# Patient Record
Sex: Female | Born: 1950 | Race: White | Hispanic: No | Marital: Single | Smoking: Former smoker
Health system: Southern US, Community
[De-identification: ages and names within clinical notes are randomized; demographics above are authoritative.]

## PROBLEM LIST (undated history)

## (undated) DIAGNOSIS — M1990 Unspecified osteoarthritis, unspecified site: Secondary | ICD-10-CM

## (undated) DIAGNOSIS — I729 Aneurysm of unspecified site: Secondary | ICD-10-CM

## (undated) DIAGNOSIS — T4145XA Adverse effect of unspecified anesthetic, initial encounter: Secondary | ICD-10-CM

## (undated) HISTORY — PX: ABDOMINAL HYSTERECTOMY: SHX81

---

## 2007-08-23 ENCOUNTER — Encounter: Admission: RE | Admit: 2007-08-23 | Discharge: 2007-08-23 | Payer: Self-pay | Admitting: Obstetrics and Gynecology

## 2010-01-17 ENCOUNTER — Inpatient Hospital Stay (HOSPITAL_COMMUNITY): Admission: EM | Admit: 2010-01-17 | Discharge: 2010-01-18 | Payer: Self-pay | Admitting: Emergency Medicine

## 2010-01-17 ENCOUNTER — Ambulatory Visit: Payer: Self-pay | Admitting: Cardiovascular Disease

## 2010-01-17 ENCOUNTER — Encounter (INDEPENDENT_AMBULATORY_CARE_PROVIDER_SITE_OTHER): Payer: Self-pay | Admitting: Emergency Medicine

## 2010-05-13 LAB — CBC
HCT: 35.7 % — ABNORMAL LOW (ref 36.0–46.0)
MCH: 34.5 pg — ABNORMAL HIGH (ref 26.0–34.0)
MCHC: 35 g/dL (ref 30.0–36.0)
RDW: 12.9 % (ref 11.5–15.5)

## 2010-05-13 LAB — LIPID PANEL
HDL: 71 mg/dL (ref >39)
LDL Cholesterol: 137 mg/dL — ABNORMAL HIGH (ref 0–99)
Total CHOL/HDL Ratio: 3.3 RATIO
Triglycerides: 116 mg/dL (ref <150)
VLDL: 23 mg/dL (ref 0–40)

## 2010-05-13 LAB — HEMOGLOBIN A1C
Hgb A1c MFr Bld: 5.5 % (ref <5.7)
Mean Plasma Glucose: 111 mg/dL (ref <117)

## 2010-05-13 LAB — COMPREHENSIVE METABOLIC PANEL
ALT: 70 U/L — ABNORMAL HIGH (ref 0–35)
Calcium: 9.5 mg/dL (ref 8.4–10.5)
Creatinine, Ser: 0.75 mg/dL (ref 0.4–1.2)
GFR calc Af Amer: >60 mL/min (ref >60)
Glucose, Bld: 104 mg/dL — ABNORMAL HIGH (ref 70–99)
Sodium: 140 mEq/L (ref 135–145)
Total Protein: 7.2 g/dL (ref 6.0–8.3)

## 2010-05-13 LAB — RAPID URINE DRUG SCREEN, HOSP PERFORMED
Barbiturates: NOT DETECTED
Opiates: NOT DETECTED

## 2010-05-13 LAB — URINALYSIS, ROUTINE W REFLEX MICROSCOPIC
Bilirubin Urine: NEGATIVE
Hgb urine dipstick: NEGATIVE
Specific Gravity, Urine: 1.015 (ref 1.005–1.030)
pH: 7.5 (ref 5.0–8.0)

## 2010-05-13 LAB — CARDIAC PANEL(CRET KIN+CKTOT+MB+TROPI)
CK, MB: 1.1 ng/mL (ref 0.3–4.0)
Troponin I: 0.01 ng/mL (ref 0.00–0.06)
Troponin I: 0.01 ng/mL (ref 0.00–0.06)

## 2010-05-13 LAB — PROTIME-INR
INR: 0.89 (ref 0.00–1.49)
Prothrombin Time: 12.3 seconds (ref 11.6–15.2)

## 2010-05-13 LAB — GLUCOSE, CAPILLARY: Glucose-Capillary: 108 mg/dL — ABNORMAL HIGH (ref 70–99)

## 2010-05-13 LAB — ETHANOL: Alcohol, Ethyl (B): <5 mg/dL (ref 0–10)

## 2010-05-13 LAB — URINE MICROSCOPIC-ADD ON

## 2011-01-08 ENCOUNTER — Encounter (HOSPITAL_COMMUNITY): Payer: Self-pay | Admitting: Pharmacy Technician

## 2011-01-13 NOTE — H&P (Signed)
Julie Tapia is an 60 y.o. female.    Chief Complaint: Right hip pain/osteoarthritis  HPI: Patient is a 60 year old white female in no acute distress. Presenting complaint right hip pain for many years. For the last month 9 months it has increased significantly. Patient has tried several conservative treatments, all of which have failed to alleviate her symptoms. X-rays in the clinic show osteoarthritic changes of her right hip. Various options were discussed the patient. Patient wishes to proceed with surgery. Risks, benefits and expectations of the procedure were discussed the patient. Patient understands risks, benefits and expectations and wishes to proceed with a right total hip arthroplasty, anterior approach per Dr. Charlann Boxer that [facility] on 01/20/2011  PCP: No primary care physician indicated. Cardiologist: Dr. Ventura Bruns -has cleared the patient for surgery Cerebrovascular: Dr. Willy Eddy - has cleared the patient for surgery  D/C Plans: Plan and discharged to go home with home health PT  Post-op Meds: Not given  Tranexamic Acid: Patient is not a candidate   PMH: 1. Hypercholesterolemia 2. Aneurysm 3. Degenerative disc disease 4. Arthritis  PSH: No past surgical history on file.  Social History:  Patient admits to previously smoking 2 packs a day for 3-5 years Patient is to drinking wine with dinner daily   Allergies:  Allergies  Allergen Reactions  . Erythromycin Anaphylaxis    All mycins  . Ciprofloxacin Hcl Itching and Swelling  . Contrast Media (Iodinated Diagnostic Agents) Itching and Swelling  . Lidocaine Itching and Other (See Comments)    Redness   . Penicillins Other (See Comments)    Felt like had Chickenpox  . Sulfa Drugs Cross Reactors Itching, Swelling and Other (See Comments)    All sulfa drugs    Medications: 1. Aspirin 325 mg 1 by mouth daily 2. Cephalexin 500 mg 1 by mouth twice a day 3. Vitamin D3 1000 units one by mouth  daily 4. Magnesium 450 mg 1 by mouth daily 5. Paroxetine 10 mg half a tablet by mouth daily 6. Multivitamin 1 by mouth daily 7. Benadryl as needed   ROS: Review of Systems  Constitutional: Negative.   HENT: Negative.   Eyes: Negative.   Respiratory: Negative.   Cardiovascular: Negative.   Gastrointestinal: Negative.   Musculoskeletal: Positive for back pain and joint pain.  Skin: Negative.   Neurological: Negative.   Endo/Heme/Allergies: Negative.   Psychiatric/Behavioral: Negative.    Physican Exam: Physical Exam  Constitutional: She is oriented to person, place, and time and well-developed, well-nourished, and in no distress.  HENT:  Head: Normocephalic and atraumatic.  Right Ear: External ear normal.  Left Ear: External ear normal.  Nose: Nose normal.  Eyes: Conjunctivae and EOM are normal. Pupils are equal, round, and reactive to light.  Neck: Normal range of motion. Neck supple. No JVD present. No tracheal deviation present. No thyromegaly present.  Cardiovascular: Normal rate, regular rhythm and normal heart sounds.  Exam reveals no gallop and no friction rub.   No murmur heard. Pulmonary/Chest: Effort normal and breath sounds normal. No respiratory distress. She has no wheezes. She exhibits no tenderness.  Abdominal: Soft.  Musculoskeletal: She exhibits tenderness.       Right hip: She exhibits decreased range of motion, decreased strength and tenderness. She exhibits no swelling, no deformity and no laceration.  Lymphadenopathy:    She has no cervical adenopathy.  Neurological: She is alert and oriented to person, place, and time.  Skin: Skin is warm and dry.  No rash noted. No erythema. No pallor.  Psychiatric: Affect normal.   Assessment: Right hip pain/OA.  Plan: Patient will undergo a right total hip arthroplasty anterior approach on 01/20/2011. Risks benefits and expectation were discussed with the patient. Patient understand risks, benefits and expectation  and wishes to proceed.  Anastasio Auerbach Julie Tapia   PAC  01/13/2011, 12:14 PM

## 2011-01-16 ENCOUNTER — Other Ambulatory Visit: Payer: Self-pay | Admitting: Orthopedic Surgery

## 2011-01-16 ENCOUNTER — Encounter (HOSPITAL_COMMUNITY): Payer: Self-pay

## 2011-01-16 ENCOUNTER — Encounter (HOSPITAL_COMMUNITY)
Admission: RE | Admit: 2011-01-16 | Discharge: 2011-01-16 | Disposition: A | Discharge status: Home / Self Care | Payer: BC Managed Care – PPO | Source: Ambulatory Visit | Attending: Orthopedic Surgery | Admitting: Orthopedic Surgery

## 2011-01-16 DIAGNOSIS — I729 Aneurysm of unspecified site: Secondary | ICD-10-CM

## 2011-01-16 DIAGNOSIS — M1990 Unspecified osteoarthritis, unspecified site: Secondary | ICD-10-CM

## 2011-01-16 DIAGNOSIS — T8859XA Other complications of anesthesia, initial encounter: Secondary | ICD-10-CM

## 2011-01-16 HISTORY — PX: REDUCTION MAMMAPLASTY: SUR839

## 2011-01-16 HISTORY — DX: Aneurysm of unspecified site: I72.9

## 2011-01-16 HISTORY — DX: Other complications of anesthesia, initial encounter: T88.59XA

## 2011-01-16 HISTORY — PX: JOINT REPLACEMENT: SHX530

## 2011-01-16 HISTORY — DX: Unspecified osteoarthritis, unspecified site: M19.90

## 2011-01-16 HISTORY — PX: BREAST CYST EXCISION: SHX579

## 2011-01-16 HISTORY — DX: Adverse effect of unspecified anesthetic, initial encounter: T41.45XA

## 2011-01-16 LAB — BASIC METABOLIC PANEL
CO2: 26 mEq/L (ref 19–32)
Calcium: 9.5 mg/dL (ref 8.4–10.5)
Chloride: 101 mEq/L (ref 96–112)
Creatinine, Ser: 0.65 mg/dL (ref 0.50–1.10)
Glucose, Bld: 92 mg/dL (ref 70–99)

## 2011-01-16 LAB — DIFFERENTIAL
Basophils Absolute: 0 10*3/uL (ref 0.0–0.1)
Eosinophils Absolute: 0.4 10*3/uL (ref 0.0–0.7)
Eosinophils Relative: 7 % — ABNORMAL HIGH (ref 0–5)
Lymphocytes Relative: 31 % (ref 12–46)
Lymphs Abs: 1.7 10*3/uL (ref 0.7–4.0)
Monocytes Absolute: 0.4 10*3/uL (ref 0.1–1.0)

## 2011-01-16 LAB — SURGICAL PCR SCREEN
MRSA, PCR: NEGATIVE
Staphylococcus aureus: NEGATIVE

## 2011-01-16 LAB — CBC
HCT: 36.9 % (ref 36.0–46.0)
MCH: 32.6 pg (ref 26.0–34.0)
MCV: 97.9 fL (ref 78.0–100.0)
Platelets: 349 10*3/uL (ref 150–400)
RDW: 12.6 % (ref 11.5–15.5)
WBC: 5.5 10*3/uL (ref 4.0–10.5)

## 2011-01-16 LAB — URINALYSIS, ROUTINE W REFLEX MICROSCOPIC
Bilirubin Urine: NEGATIVE
Glucose, UA: NEGATIVE mg/dL
Specific Gravity, Urine: 1.003 — ABNORMAL LOW (ref 1.005–1.030)

## 2011-01-16 LAB — PROTIME-INR: Prothrombin Time: 12.7 seconds (ref 11.6–15.2)

## 2011-01-16 NOTE — Pre-Procedure Instructions (Signed)
01-16-11 ZOX(0'96), Echo 12'11, Medical clearance note-Dr. Britz([facility]) with chart  And Dr. Gwenlyn Fudge 4'12([facility]) with chart.

## 2011-01-16 NOTE — Patient Instructions (Addendum)
20 Julie Tapia  01/16/2011   Your procedure is scheduled on: 01-20-11  Report to [facility] at  07:30 AM.  Call this number if you have problems the morning of surgery: [phone]   Remember:   Do not eat food:After Midnight.  Do not drink clear liquids: After Midnight.  Take these medicines the morning of surgery with A SIP OF WATER: continue Aspirin as instructed by Dr. Nilsa Nutting office. Use Benadryl , Epi Pen if needed.  Do not wear jewelry, make-up or nail polish.  Do not wear lotions, powders, or perfumes. You may wear deodorant.  Do not shave 48 hours prior to surgery.  Do not bring valuables to the hospital.  Contacts, dentures or bridgework may not be worn into surgery.  Leave suitcase in the car. After surgery it may be brought to your room.  For patients admitted to the hospital, checkout time is 11:00 AM the day of discharge.   Patients discharged the day of surgery will not be allowed to drive home.  Name and phone number of your driver: [name] [phone]  Special Instructions: CHG Shower Use Special Wash: 1/2 bottle night before surgery and 1/2 bottle morning of surgery.   Please read over the following fact sheets that you were given: Blood Transfusion Information and MRSA Information, Incentive Spirometry instruction.

## 2011-01-20 ENCOUNTER — Encounter (HOSPITAL_COMMUNITY)
Admission: RE | Disposition: A | Discharge status: Home Health | Payer: Self-pay | Source: Ambulatory Visit | Attending: Orthopedic Surgery

## 2011-01-20 ENCOUNTER — Inpatient Hospital Stay (HOSPITAL_COMMUNITY)
Admission: RE | Admit: 2011-01-20 | Discharge: 2011-01-22 | DRG: 818 | Disposition: A | Discharge status: Home Health | Payer: BC Managed Care – PPO | Source: Ambulatory Visit | Attending: Orthopedic Surgery | Admitting: Orthopedic Surgery

## 2011-01-20 ENCOUNTER — Inpatient Hospital Stay (HOSPITAL_COMMUNITY): Payer: BC Managed Care – PPO

## 2011-01-20 ENCOUNTER — Encounter (HOSPITAL_COMMUNITY): Payer: Self-pay | Admitting: Anesthesiology

## 2011-01-20 ENCOUNTER — Encounter (HOSPITAL_COMMUNITY): Payer: Self-pay | Admitting: *Deleted

## 2011-01-20 ENCOUNTER — Inpatient Hospital Stay (HOSPITAL_COMMUNITY): Payer: BC Managed Care – PPO | Admitting: Anesthesiology

## 2011-01-20 DIAGNOSIS — Z79899 Other long term (current) drug therapy: Secondary | ICD-10-CM

## 2011-01-20 DIAGNOSIS — I729 Aneurysm of unspecified site: Secondary | ICD-10-CM | POA: Diagnosis present

## 2011-01-20 DIAGNOSIS — M1610 Unilateral primary osteoarthritis, unspecified hip: Principal | ICD-10-CM | POA: Diagnosis present

## 2011-01-20 DIAGNOSIS — IMO0002 Reserved for concepts with insufficient information to code with codable children: Secondary | ICD-10-CM | POA: Diagnosis present

## 2011-01-20 DIAGNOSIS — M169 Osteoarthritis of hip, unspecified: Principal | ICD-10-CM | POA: Diagnosis present

## 2011-01-20 DIAGNOSIS — Z01812 Encounter for preprocedural laboratory examination: Secondary | ICD-10-CM

## 2011-01-20 DIAGNOSIS — Z7982 Long term (current) use of aspirin: Secondary | ICD-10-CM

## 2011-01-20 DIAGNOSIS — E7800 Pure hypercholesterolemia, unspecified: Secondary | ICD-10-CM | POA: Diagnosis present

## 2011-01-20 DIAGNOSIS — Z96649 Presence of unspecified artificial hip joint: Secondary | ICD-10-CM

## 2011-01-20 HISTORY — PX: TOTAL HIP ARTHROPLASTY: SHX124

## 2011-01-20 LAB — ABO/RH: ABO/RH(D): A POS

## 2011-01-20 LAB — TYPE AND SCREEN

## 2011-01-20 SURGERY — ARTHROPLASTY, HIP, TOTAL, ANTERIOR APPROACH
Anesthesia: Spinal | Site: Hip | Laterality: Right | Wound class: Clean

## 2011-01-20 MED ORDER — HYDROMORPHONE HCL PF 1 MG/ML IJ SOLN
INTRAMUSCULAR | Status: AC
  Filled 2011-01-20: qty 1

## 2011-01-20 MED ORDER — MIDAZOLAM HCL 5 MG/5ML IJ SOLN
INTRAMUSCULAR | Status: DC | PRN
  Administered 2011-01-20 (×4): 1 mg via INTRAVENOUS

## 2011-01-20 MED ORDER — HYDROMORPHONE HCL PF 1 MG/ML IJ SOLN: 0.5000 mg | INTRAMUSCULAR | Status: DC | PRN

## 2011-01-20 MED ORDER — LACTATED RINGERS IV SOLN
INTRAVENOUS | Status: DC | PRN
  Administered 2011-01-20 (×2): via INTRAVENOUS

## 2011-01-20 MED ORDER — PROMETHAZINE HCL 25 MG/ML IJ SOLN: 6.2500 mg | INTRAMUSCULAR | Status: DC | PRN

## 2011-01-20 MED ORDER — NAPHAZOLINE HCL 0.1 % OP SOLN
1.0000 [drp] | Freq: Four times a day (QID) | OPHTHALMIC | Status: DC | PRN
  Filled 2011-01-20: qty 15

## 2011-01-20 MED ORDER — BUPIVACAINE HCL (PF) 0.5 % IJ SOLN
INTRAMUSCULAR | Status: DC | PRN
  Administered 2011-01-20: 3 mL

## 2011-01-20 MED ORDER — ACETAMINOPHEN 650 MG RE SUPP: 650.0000 mg | Freq: Four times a day (QID) | RECTAL | Status: DC | PRN

## 2011-01-20 MED ORDER — MENTHOL 3 MG MT LOZG: 1.0000 | LOZENGE | OROMUCOSAL | Status: DC | PRN

## 2011-01-20 MED ORDER — CEFAZOLIN SODIUM 1-5 GM-% IV SOLN: 1.0000 g | Freq: Once | INTRAVENOUS | Status: DC

## 2011-01-20 MED ORDER — FERROUS SULFATE 325 (65 FE) MG PO TABS
325.0000 mg | ORAL_TABLET | Freq: Three times a day (TID) | ORAL | Status: DC
  Administered 2011-01-20 – 2011-01-22 (×4): 325 mg via ORAL
  Filled 2011-01-20 (×7): qty 1

## 2011-01-20 MED ORDER — PROPOFOL 10 MG/ML IV EMUL
INTRAVENOUS | Status: DC | PRN
  Administered 2011-01-20: 4.5 mg/kg/h via INTRAVENOUS

## 2011-01-20 MED ORDER — MAGNESIUM HYDROXIDE 400 MG/5ML PO SUSP: 30.0000 mL | Freq: Two times a day (BID) | ORAL | Status: DC | PRN

## 2011-01-20 MED ORDER — PHENOL 1.4 % MT LIQD: 1.0000 | OROMUCOSAL | Status: DC | PRN

## 2011-01-20 MED ORDER — POLYETHYLENE GLYCOL 3350 17 G PO PACK: 17.0000 g | PACK | Freq: Every day | ORAL | Status: DC | PRN

## 2011-01-20 MED ORDER — ONDANSETRON HCL 4 MG PO TABS: 4.0000 mg | ORAL_TABLET | Freq: Four times a day (QID) | ORAL | Status: DC | PRN

## 2011-01-20 MED ORDER — METHOCARBAMOL 500 MG PO TABS
500.0000 mg | ORAL_TABLET | Freq: Four times a day (QID) | ORAL | Status: DC | PRN
  Administered 2011-01-21: 500 mg via ORAL
  Filled 2011-01-20: qty 1

## 2011-01-20 MED ORDER — HYDROMORPHONE HCL PF 2 MG/ML IJ SOLN
INTRAMUSCULAR | Status: AC
  Administered 2011-01-20: 0.5 mg via INTRAMUSCULAR
  Filled 2011-01-20: qty 1

## 2011-01-20 MED ORDER — BISACODYL 10 MG RE SUPP: 10.0000 mg | Freq: Every day | RECTAL | Status: DC | PRN

## 2011-01-20 MED ORDER — SODIUM CHLORIDE 0.9 % IR SOLN
Status: DC | PRN
  Administered 2011-01-20: 1000 mL

## 2011-01-20 MED ORDER — TRANEXAMIC ACID 100 MG/ML IV SOLN
940.0000 mg | INTRAVENOUS | Status: DC
  Filled 2011-01-20: qty 9.4

## 2011-01-20 MED ORDER — RIVAROXABAN 10 MG PO TABS
10.0000 mg | ORAL_TABLET | ORAL | Status: DC
  Administered 2011-01-21 – 2011-01-22 (×2): 10 mg via ORAL
  Filled 2011-01-20 (×2): qty 1

## 2011-01-20 MED ORDER — MAGNESIUM 400 MG PO CAPS: 1.0000 | ORAL_CAPSULE | Freq: Every day | ORAL | Status: DC

## 2011-01-20 MED ORDER — VANCOMYCIN HCL IN DEXTROSE 1-5 GM/200ML-% IV SOLN
1000.0000 mg | Freq: Two times a day (BID) | INTRAVENOUS | Status: AC
  Administered 2011-01-20: 1000 mg via INTRAVENOUS
  Filled 2011-01-20: qty 200

## 2011-01-20 MED ORDER — FENTANYL CITRATE 0.05 MG/ML IJ SOLN
INTRAMUSCULAR | Status: DC | PRN
  Administered 2011-01-20 (×2): 25 ug via INTRAVENOUS

## 2011-01-20 MED ORDER — TRANEXAMIC ACID 100 MG/ML IV SOLN
940.0000 mg | INTRAVENOUS | Status: DC | PRN
  Administered 2011-01-20: 940 mg via INTRAVENOUS

## 2011-01-20 MED ORDER — METOCLOPRAMIDE HCL 5 MG/ML IJ SOLN: 5.0000 mg | Freq: Three times a day (TID) | INTRAMUSCULAR | Status: DC | PRN

## 2011-01-20 MED ORDER — VANCOMYCIN HCL 1000 MG IV SOLR
1000.0000 mg | INTRAVENOUS | Status: DC | PRN
  Administered 2011-01-20: 1000 mg via INTRAVENOUS

## 2011-01-20 MED ORDER — ONDANSETRON HCL 4 MG/2ML IJ SOLN
INTRAMUSCULAR | Status: DC | PRN
  Administered 2011-01-20 (×2): 2 mg via INTRAVENOUS

## 2011-01-20 MED ORDER — THERA M PLUS PO TABS
1.0000 | ORAL_TABLET | Freq: Every day | ORAL | Status: DC
  Administered 2011-01-20 – 2011-01-22 (×3): via ORAL
  Filled 2011-01-20 (×3): qty 1

## 2011-01-20 MED ORDER — HYDROMORPHONE HCL PF 1 MG/ML IJ SOLN
0.2500 mg | INTRAMUSCULAR | Status: DC | PRN
  Administered 2011-01-20: 0.5 mg via INTRAVENOUS

## 2011-01-20 MED ORDER — EPHEDRINE SULFATE 50 MG/ML IJ SOLN
INTRAMUSCULAR | Status: DC | PRN
  Administered 2011-01-20 (×7): 5 mg via INTRAVENOUS

## 2011-01-20 MED ORDER — EPINEPHRINE 0.15 MG/0.3ML IJ DEVI: 0.1500 mg | INTRAMUSCULAR | Status: DC | PRN

## 2011-01-20 MED ORDER — METHOCARBAMOL 100 MG/ML IJ SOLN
500.0000 mg | Freq: Four times a day (QID) | INTRAVENOUS | Status: DC | PRN
  Administered 2011-01-21: 500 mg via INTRAVENOUS
  Filled 2011-01-20: qty 5

## 2011-01-20 MED ORDER — VANCOMYCIN HCL IN DEXTROSE 1-5 GM/200ML-% IV SOLN
INTRAVENOUS | Status: AC
  Filled 2011-01-20: qty 200

## 2011-01-20 MED ORDER — PAROXETINE HCL 10 MG PO TABS
5.0000 mg | ORAL_TABLET | Freq: Every day | ORAL | Status: DC
  Administered 2011-01-21: 5 mg via ORAL
  Administered 2011-01-22: 10 mg via ORAL
  Filled 2011-01-20 (×2): qty 0.5

## 2011-01-20 MED ORDER — FLEET ENEMA 7-19 GM/118ML RE ENEM: 1.0000 | ENEMA | Freq: Every day | RECTAL | Status: DC | PRN

## 2011-01-20 MED ORDER — DOCUSATE SODIUM 100 MG PO CAPS
100.0000 mg | ORAL_CAPSULE | Freq: Two times a day (BID) | ORAL | Status: DC
  Administered 2011-01-20 – 2011-01-22 (×4): 100 mg via ORAL
  Filled 2011-01-20 (×6): qty 1

## 2011-01-20 MED ORDER — ALUM & MAG HYDROXIDE-SIMETH 200-200-20 MG/5ML PO SUSP: 30.0000 mL | ORAL | Status: DC | PRN

## 2011-01-20 MED ORDER — DIPHENHYDRAMINE HCL 25 MG PO CAPS
25.0000 mg | ORAL_CAPSULE | Freq: Four times a day (QID) | ORAL | Status: DC | PRN
Start: 2011-01-20 — End: 2011-01-22
  Administered 2011-01-20 – 2011-01-21 (×3): 25 mg via ORAL
  Filled 2011-01-20 (×4): qty 1

## 2011-01-20 MED ORDER — BISACODYL 5 MG PO TBEC: 10.0000 mg | DELAYED_RELEASE_TABLET | Freq: Every day | ORAL | Status: DC | PRN

## 2011-01-20 MED ORDER — ACETAMINOPHEN 10 MG/ML IV SOLN
INTRAVENOUS | Status: AC
  Filled 2011-01-20: qty 100

## 2011-01-20 MED ORDER — ACETAMINOPHEN 325 MG PO TABS: 650.0000 mg | ORAL_TABLET | Freq: Four times a day (QID) | ORAL | Status: DC | PRN

## 2011-01-20 MED ORDER — ONDANSETRON HCL 4 MG/2ML IJ SOLN: 4.0000 mg | Freq: Four times a day (QID) | INTRAMUSCULAR | Status: DC | PRN

## 2011-01-20 MED ORDER — HYDROCODONE-ACETAMINOPHEN 7.5-325 MG PO TABS
1.0000 | ORAL_TABLET | ORAL | Status: DC | PRN
  Administered 2011-01-21: 2 via ORAL
  Filled 2011-01-20: qty 2

## 2011-01-20 MED ORDER — HYDROMORPHONE HCL PF 2 MG/ML IJ SOLN
INTRAMUSCULAR | Status: AC
  Administered 2011-01-20: 1 mg via INTRAVENOUS
  Filled 2011-01-20: qty 1

## 2011-01-20 MED ORDER — SODIUM CHLORIDE 0.9 % IV SOLN
INTRAVENOUS | Status: DC
  Administered 2011-01-20 – 2011-01-22 (×3): via INTRAVENOUS
  Filled 2011-01-20 (×14): qty 1000

## 2011-01-20 MED ORDER — VITAMIN D3 25 MCG (1000 UNIT) PO TABS
1000.0000 [IU] | ORAL_TABLET | Freq: Every day | ORAL | Status: DC
  Administered 2011-01-20 – 2011-01-22 (×3): 1000 [IU] via ORAL
  Filled 2011-01-20 (×3): qty 1

## 2011-01-20 MED ORDER — LACTATED RINGERS IV SOLN: INTRAVENOUS | Status: DC

## 2011-01-20 MED ORDER — OMEGA-3-ACID ETHYL ESTERS 1 G PO CAPS
2.0000 g | ORAL_CAPSULE | Freq: Every day | ORAL | Status: DC
  Administered 2011-01-20: 2 g via ORAL
  Administered 2011-01-21: 1 g via ORAL
  Administered 2011-01-22: 2 g via ORAL
  Filled 2011-01-20 (×3): qty 2

## 2011-01-20 MED ORDER — METOCLOPRAMIDE HCL 10 MG PO TABS: 5.0000 mg | ORAL_TABLET | Freq: Three times a day (TID) | ORAL | Status: DC | PRN

## 2011-01-20 MED ORDER — HYDROMORPHONE HCL PF 2 MG/ML IJ SOLN
0.5000 mg | INTRAMUSCULAR | Status: DC | PRN
  Administered 2011-01-20 – 2011-01-21 (×2): 1 mg via INTRAVENOUS
  Filled 2011-01-20 (×2): qty 1

## 2011-01-20 MED ORDER — MAGNESIUM OXIDE 400 MG PO TABS
400.0000 mg | ORAL_TABLET | Freq: Every day | ORAL | Status: DC
  Administered 2011-01-20 – 2011-01-22 (×3): 400 mg via ORAL
  Filled 2011-01-20 (×3): qty 1

## 2011-01-20 MED ORDER — BUPIVACAINE HCL (PF) 0.5 % IJ SOLN
INTRAMUSCULAR | Status: AC
  Filled 2011-01-20: qty 30

## 2011-01-20 SURGICAL SUPPLY — 37 items
BAG ZIPLOCK 12X15 (MISCELLANEOUS) ×4 IMPLANT
BLADE SAW SGTL 18X1.27X75 (BLADE) ×2 IMPLANT
CELLS DAT CNTRL 66122 CELL SVR (MISCELLANEOUS) ×1 IMPLANT
CLOTH BEACON ORANGE TIMEOUT ST (SAFETY) ×2 IMPLANT
DERMABOND ADVANCED (GAUZE/BANDAGES/DRESSINGS) ×1
DERMABOND ADVANCED .7 DNX12 (GAUZE/BANDAGES/DRESSINGS) ×1 IMPLANT
DRAPE C-ARM 42X72 X-RAY (DRAPES) ×2 IMPLANT
DRAPE STERI IOBAN 125X83 (DRAPES) ×2 IMPLANT
DRAPE U-SHAPE 47X51 STRL (DRAPES) ×6 IMPLANT
DRSG AQUACEL AG ADV 3.5X10 (GAUZE/BANDAGES/DRESSINGS) ×2 IMPLANT
DRSG TEGADERM 4X4.75 (GAUZE/BANDAGES/DRESSINGS) ×2 IMPLANT
DURAPREP 26ML APPLICATOR (WOUND CARE) ×2 IMPLANT
ELECT BLADE TIP CTD 4 INCH (ELECTRODE) ×2 IMPLANT
ELECT REM PT RETURN 9FT ADLT (ELECTROSURGICAL) ×2
ELECTRODE REM PT RTRN 9FT ADLT (ELECTROSURGICAL) ×1 IMPLANT
EVACUATOR 1/8 PVC DRAIN (DRAIN) ×2 IMPLANT
FACESHIELD LNG OPTICON STERILE (SAFETY) ×8 IMPLANT
GAUZE SPONGE 2X2 8PLY STRL LF (GAUZE/BANDAGES/DRESSINGS) ×1 IMPLANT
GLOVE BIOGEL PI IND STRL 7.5 (GLOVE) ×1 IMPLANT
GLOVE BIOGEL PI IND STRL 8.5 (GLOVE) ×1 IMPLANT
GLOVE BIOGEL PI INDICATOR 7.5 (GLOVE) ×1
GLOVE BIOGEL PI INDICATOR 8.5 (GLOVE) ×1
GLOVE ECLIPSE 8.0 STRL XLNG CF (GLOVE) ×2 IMPLANT
GLOVE ORTHO TXT STRL SZ7.5 (GLOVE) ×4 IMPLANT
GOWN STRL NON-REIN LRG LVL3 (GOWN DISPOSABLE) ×2 IMPLANT
KIT BASIN OR (CUSTOM PROCEDURE TRAY) ×2 IMPLANT
PACK TOTAL JOINT (CUSTOM PROCEDURE TRAY) ×2 IMPLANT
PADDING CAST COTTON 6X4 STRL (CAST SUPPLIES) ×2 IMPLANT
RTRCTR WOUND ALEXIS 18CM MED (MISCELLANEOUS) ×2
SPONGE GAUZE 2X2 STER 10/PKG (GAUZE/BANDAGES/DRESSINGS) ×1
SUCTION FRAZIER 12FR DISP (SUCTIONS) ×2 IMPLANT
SUT MNCRL AB 4-0 PS2 18 (SUTURE) ×2 IMPLANT
SUT VIC AB 1 CT1 36 (SUTURE) ×8 IMPLANT
SUT VIC AB 2-0 CT1 27 (SUTURE) ×2
SUT VIC AB 2-0 CT1 TAPERPNT 27 (SUTURE) ×2 IMPLANT
TOWEL OR 17X26 10 PK STRL BLUE (TOWEL DISPOSABLE) ×4 IMPLANT
TRAY FOLEY CATH 14FRSI W/METER (CATHETERS) ×2 IMPLANT

## 2011-01-20 NOTE — Anesthesia Procedure Notes (Signed)
Spinal  Patient location during procedure: OR Staffing Performed by: anesthesiologist  Preanesthetic Checklist Completed: patient identified, site marked, surgical consent, pre-op evaluation, timeout performed, IV checked, risks and benefits discussed and monitors and equipment checked Spinal Block Patient position: sitting Prep: Betadine Patient monitoring: heart rate, continuous pulse ox and blood pressure Location: L2-3 Injection technique: single-shot Needle Needle type: Spinocan  Needle gauge: 22 G Needle length: 9 cm Needle insertion depth: 4.5 cm Assessment Sensory level: T10 Additional Notes Expiration date of kit checked and confirmed. Patient tolerated procedure well, without complications.

## 2011-01-20 NOTE — Progress Notes (Signed)
Did not release Ancef patient wants to speak  with the doctor

## 2011-01-20 NOTE — Anesthesia Preprocedure Evaluation (Addendum)
Anesthesia Evaluation  Patient identified by MRN, date of birth, ID band Patient awake    Reviewed: Allergy & Precautions, H&P , NPO status , Patient's Chart, lab work & pertinent test results  Airway Mallampati: II TM Distance: >3 FB Neck ROM: full    Dental No notable dental hx.    Pulmonary neg pulmonary ROS, asthma ,  clear to auscultation  Pulmonary exam normal       Cardiovascular Exercise Tolerance: Good neg cardio ROS regular Normal PFO   Neuro/Psych Negative Neurological ROS  Negative Psych ROS   GI/Hepatic negative GI ROS, Neg liver ROS,   Endo/Other  Negative Endocrine ROS  Renal/GU negative Renal ROS  Genitourinary negative   Musculoskeletal   Abdominal   Peds  Hematology negative hematology ROS (+)   Anesthesia Other Findings   Reproductive/Obstetrics negative OB ROS                          Anesthesia Physical Anesthesia Plan  ASA: II  Anesthesia Plan: General and Spinal   Post-op Pain Management:    Induction:   Airway Management Planned:   Additional Equipment:   Intra-op Plan:   Post-operative Plan:   Informed Consent: I have reviewed the patients History and Physical, chart, labs and discussed the procedure including the risks, benefits and alternatives for the proposed anesthesia with the patient or authorized representative who has indicated his/her understanding and acceptance.   Dental Advisory Given  Plan Discussed with: CRNA  Anesthesia Plan Comments:        Anesthesia Quick Evaluation

## 2011-01-20 NOTE — Interval H&P Note (Signed)
History and Physical Interval Note:   01/20/2011   8:47 AM   Julie Tapia  has presented today for surgery, with the diagnosis of Osteoarthritis Right Hip  The various methods of treatment have been discussed with the patient and family. After consideration of risks, benefits and other options for treatment, the patient has consented to  Procedure(s): right TOTAL HIP ARTHROPLASTY through an ANTERIOR APPROACH as a surgical intervention .  The patients' history has been reviewed, patient examined, no change in status, stable for surgery.  I have reviewed the patients' chart and labs.  Questions were answered to the patient's satisfaction.     Shelda Pal  MD

## 2011-01-20 NOTE — Anesthesia Postprocedure Evaluation (Signed)
  Anesthesia Post-op Note  Patient: Julie Tapia  Procedure(s) Performed:  TOTAL HIP ARTHROPLASTY ANTERIOR APPROACH  Patient Location: PACU  Anesthesia Type: General  Level of Consciousness: awake and alert   Airway and Oxygen Therapy: Patient Spontanous Breathing  Post-op Pain: mild  Post-op Assessment: Post-op Vital signs reviewed, Patient's Cardiovascular Status Stable, Respiratory Function Stable, Patent Airway and No signs of Nausea or vomiting  Post-op Vital Signs: stable  Complications: No apparent anesthesia complications

## 2011-01-20 NOTE — Op Note (Signed)
NAME:  Julie Tapia                ACCOUNT NO.: [id number]      MEDICAL RECORD NO.: [id number]      FACILITY:  [facility]      PHYSICIAN:  Durene Romans D  DATE OF BIRTH:  11-05-1950     DATE OF PROCEDURE:  01/20/2011                                 OPERATIVE REPORT         PREOPERATIVE DIAGNOSIS: Right  hip osteoarthritis.      POSTOPERATIVE DIAGNOSIS:  Right osteoarthritis.      PROCEDURE:  Right total hip replacement through an anterior approach   utilizing DePuy THR system, component size 52 pinnacle cup, a size 36+4 neutral   Altrex liner, a size 5 standard Tri Lock stem with a 36=1.5 delta ceramic   ball.      SURGEON:  Madlyn Frankel. Charlann Boxer, M.D.      ASSISTANT:  Lanney Gins, PA      ANESTHESIA:  Spinal.      SPECIMENS:  None.      COMPLICATIONS:  None.      BLOOD LOSS:  <400 cc     DRAINS:  One Hemovac.      INDICATION OF THE PROCEDURE:  Julie Tapia is a 60 y.o. female who had   presented to office for evaluation of right hip pain.  Radiographs revealed   progressive degenerative changes with bone-on-bone   articulation to the  hip joint.  The patient had painful limited range of   motion significantly affecting their overall quality of life.  The patient was failing to    respond to conservative measures, and at this point was ready   to proceed with more definitive measures.  The patient has noted progressive   degenerative changes in his hip, progressive problems and dysfunction   with regarding the hip prior to surgery.  Consent was obtained for   benefit of pain relief.  Specific risk of infection, DVT, component   failure, dislocation, need for revision surgery, as well discussion of   the anterior versus posterior approach were reviewed.  Consent was   obtained for benefit of anterior pain relief through an anterior   approach.      PROCEDURE IN DETAIL:  The patient was brought to operative theater.   Once adequate anesthesia, preoperative  antibiotics, Ancef administered.   The patient was positioned supine on the OSI Hanna table.  Once adequate   padding and boney process was carried out, we had predraped out the hip, and  used fluoroscopy to confirm orientation of the pelvis and position.      The right hip was then prepped and draped from proximal iliac crest to   mid thigh with shower curtain technique.      Time-out was performed identifying the patient, planned procedure, and   extremity.  An incision was then made 2 cm distal and lateral to the   anterior superior iliac spine extending over the orientation of the   tensor fascia lata muscle and sharp dissection was carried down to the   fascia of the muscle and protractor placed in the soft tissues.      The fascia was then incised.  The muscle belly was identified and swept   laterally and retractor placed  along the superior neck.  Following   cauterization of the circumflex vessels and removing some pericapsular   fat, a second cobra retractor was placed on the inferior neck.  A third   retractor was placed on the anterior acetabulum after elevating the   anterior rectus.  A L-capsulotomy was along the line of the   superior neck to the trochanteric fossa, then extended proximally and   distally.  Stay sutures were placed and the retractors were then placed   intracapsular.  We then identified the orientation of the trochanteric fossa and   identify the orientation of my neck cut, confirmed this radiographically   and then made a neck osteotomy with the femur on traction.  The femoral   head was removed without difficulty or complication.  Traction was let   off and retractors were placed posterior and anterior for the   acetabulum.      The labrum and foveal tissue were debrided.  I began reaming with a 46mm   reamer and reamed up to 51 reamer with good bony bed preparation and a 52mm   cup was chosen.  The final 52 Pinnacle cup was then impacted under  fluoroscopy  to confirm the depth of penetration and orientation and turned to   abduction.  A screw was placed followed by the hole eliminator was placed and the final   36+4 Altrex liner impacted with good visualized rim fit.  The cup was positioned anatomically within the   acetabular portion of the pelvis.      At this point, the femur was rolled at 80 degrees.  Further capsule was   released off the inferior aspect of the femoral neck.  I then   released the superior capsule proximally.  The hook was placed laterally   along the femur and elevated manually and held in position with the bed   hook.  The leg was then extended and adducted with the leg roll to 100   degrees of external rotation.  Once the proximal femur was fully   exposed, I used a box osteotome to set orientation.  I then began   broaching with the starting chili pepper broach and passed this by hand and then broached up to 5.  With the 5 broach in place I chose a standard neck and did a trial reduction.  The offset was appropriate, leg lengths   appeared to be equal, confirmed radiographically.   Given these findings, I went ahead and dislocated the hip, repositioned all   retractors and positioned the right hip and extended and abducted way.  The final 5 standard  Tri Lock stem was   chosen and it was impacted down to the level of neck cut.  Based on this   and the trial reduction, a 36+1.5 delta ceramic ball was chosen and   impacted onto a clean and dry trunnion, and the hip was reduced.  The   hip had been irrigated throughout the case again at this point.  I did   reapproximate the superior capsular leaflet to the anterior leaflet   using #1 Vicryl, placed a medium Hemovac drain deep.  The fascia of the   tensor fascia lata muscle was then reapproximated using #1 Vicryl.  The   remaining wound was closed with 2-0 Vicryl and running 4-0 Monocryl.   The hip was cleaned, dried, and dressed sterilely using Dermabond  and   Aquacel dressing.  Drain site dressed separately.  She was then brought   to recovery room in stable condition tolerating the procedure well.    Danae Orleans, PA-C was present for the entirety of the case involved from   preoperative positioning, perioperative retractor management, general   facilitation of the case, as well as primary wound closure as assistant.            Pietro Cassis Alvan Dame, M.D.            MDO/MEDQ  D:  12/23/2010  T:  12/23/2010  Job:  [id number]      Electronically Signed by Paralee Cancel M.D. on 12/29/2010 09:15:38 AM

## 2011-01-20 NOTE — Transfer of Care (Signed)
Immediate Anesthesia Transfer of Care Note  Patient: Julie Tapia  Procedure(s) Performed:  TOTAL HIP ARTHROPLASTY ANTERIOR APPROACH  Patient Location: PACU  Anesthesia Type: Spinal  Level of Consciousness: awake and alert   Airway & Oxygen Therapy: Patient Spontanous Breathing and Patient connected to face mask  Post-op Assessment: Report given to PACU RN and Post -op Vital signs reviewed and stable  Post vital signs: Reviewed and stable  Complications: No apparent anesthesia complications

## 2011-01-21 ENCOUNTER — Encounter (HOSPITAL_COMMUNITY): Payer: Self-pay | Admitting: Orthopedic Surgery

## 2011-01-21 LAB — BASIC METABOLIC PANEL
BUN: 7 mg/dL (ref 6–23)
Creatinine, Ser: 0.61 mg/dL (ref 0.50–1.10)
GFR calc Af Amer: >90 mL/min (ref >90)
GFR calc non Af Amer: >90 mL/min (ref >90)
Potassium: 3.9 mEq/L (ref 3.5–5.1)

## 2011-01-21 LAB — CBC
HCT: 28.6 % — ABNORMAL LOW (ref 36.0–46.0)
MCHC: 33.2 g/dL (ref 30.0–36.0)
MCV: 97.3 fL (ref 78.0–100.0)
Platelets: 239 10*3/uL (ref 150–400)
RDW: 12.3 % (ref 11.5–15.5)

## 2011-01-21 MED ORDER — OXYCODONE HCL 5 MG PO TABS
5.0000 mg | ORAL_TABLET | ORAL | Status: DC | PRN
  Administered 2011-01-21 (×2): 5 mg via ORAL
  Filled 2011-01-21 (×4): qty 1

## 2011-01-21 MED ORDER — KETOROLAC TROMETHAMINE 15 MG/ML IJ SOLN
7.5000 mg | Freq: Four times a day (QID) | INTRAMUSCULAR | Status: DC
  Administered 2011-01-21 – 2011-01-22 (×3): 7.5 mg via INTRAVENOUS
  Filled 2011-01-21 (×5): qty 1

## 2011-01-21 MED ORDER — KETOROLAC TROMETHAMINE 15 MG/ML IJ SOLN
15.0000 mg | Freq: Four times a day (QID) | INTRAMUSCULAR | Status: DC
  Administered 2011-01-21: 15 mg via INTRAVENOUS
  Filled 2011-01-21 (×2): qty 1

## 2011-01-21 MED ORDER — ACETAMINOPHEN 500 MG PO TABS
1000.0000 mg | ORAL_TABLET | Freq: Three times a day (TID) | ORAL | Status: DC
  Administered 2011-01-21 – 2011-01-22 (×4): 1000 mg via ORAL
  Filled 2011-01-21: qty 2
  Filled 2011-01-21: qty 1
  Filled 2011-01-21 (×6): qty 2

## 2011-01-21 MED ORDER — ASPIRIN EC 325 MG PO TBEC
325.0000 mg | DELAYED_RELEASE_TABLET | Freq: Every day | ORAL | Status: DC
  Administered 2011-01-21: 325 mg via ORAL
  Filled 2011-01-21 (×3): qty 1

## 2011-01-21 MED ORDER — SODIUM CHLORIDE 0.9 % IV BOLUS (SEPSIS)
500.0000 mL | Freq: Once | INTRAVENOUS | Status: AC
  Administered 2011-01-21: 500 mL via INTRAVENOUS

## 2011-01-21 NOTE — Progress Notes (Signed)
Physical Therapy Treatment Patient Details Name: Julie Tapia MRN: [id number] DOB: 06-07-1950 Today's Date: 01/21/2011 1445 - 1508  2GT PT Assessment/Plan  PT - Assessment/Plan PT Plan: Discharge plan remains appropriate PT Frequency: 7X/week Follow Up Recommendations: Home health PT PT Goals  Acute Rehab PT Goals PT Goal: Supine/Side to Sit - Progress: Progressing toward goal Pt will Transfer Sit to Stand/Stand to Sit: with supervision PT Goal: Ambulate - Progress: Progressing toward goal  PT Treatment Precautions/Restrictions  Restrictions Weight Bearing Restrictions: No Mobility (including Balance) Bed Mobility Supine to Sit: 5: Supervision;4: Min assist Transfers Sit to Stand: 5: Supervision;4: Min assist;From bed;With upper extremity assist Sit to Stand Details (indicate cue type and reason): cues for LE position and use of UEs Stand to Sit: 4: Min assist;To bed;With upper extremity assist Stand to Sit Details: cues for LE position and use of UEs Ambulation/Gait Ambulation/Gait Assistance: 5: Supervision;4: Min assist Ambulation/Gait Assistance Details (indicate cue type and reason): cues for posture and position from RW Ambulation Distance (Feet):  (2x180) Assistive device: Rolling walker Gait Pattern: Step-to pattern;Step-through pattern    Exercise    End of Session PT - End of Session Activity Tolerance: Patient tolerated treatment well Patient left: Other (comment) (sitting at bedside for OT) General Behavior During Session: [facility] for tasks performed Cognition: [facility] for tasks performed  Zia Kanner 01/21/2011, 3:54 PM

## 2011-01-21 NOTE — Progress Notes (Signed)
Physical Therapy Evaluation Patient Details Name: Julie Tapia MRN: [id number] DOB: 02-Apr-1950 Today's Date: 01/21/2011 1002 - 1035 EVAL Problem List: There is no problem list on file for this patient.   Past Medical History:  Past Medical History  Diagnosis Date  . Complication of anesthesia 01-16-11    Pt. very slow to awaken and was days before feeling like herself.  . 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 ICA  aneurysm  . Asthma 01-16-11    chronic asthmatic-child yrs, no severe issues in adult yrs", occ. tight chested-"no inhalers in yrs.  . Arthritis 01-16-11    osteoarthritis,-knees, hips, ankles. 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 Abdominal hysterectomy   . Reduction mammaplasty 01-16-11    Bil. '86( problems with s/p surgical clot at rt. breast drainage site)  . Joint replacement 01-16-11    Rt. great toe ball joint replacement-metal  . Breast cyst excision 01-16-11    several excisions in the past    PT Assessment/Plan/Recommendation PT Assessment Clinical Impression Statement: Pt with R THR 01/20/11 presents with decreased functional mobility and will benefit from skilled PT intervention to maximize I for D/C home PT Recommendation/Assessment: Patient will need skilled PT in the acute care venue PT Problem List: Decreased strength;Decreased activity tolerance;Decreased mobility;Decreased knowledge of use of DME;Pain PT Therapy Diagnosis : Difficulty walking PT Plan PT Frequency: 7X/week PT Treatment/Interventions: DME instruction;Gait training;Stair training;Therapeutic exercise;Patient/family education PT Recommendation Recommendations for Other Services: OT consult Follow Up Recommendations: Home health PT PT Goals  Acute Rehab PT Goals PT Goal Formulation: With patient Time For Goal Achievement: 6  days Pt will go Supine/Side to Sit: with supervision PT Goal: Supine/Side to Sit - Progress: Not met Pt will go Sit to Supine/Side: with supervision PT Goal: Sit to Supine/Side - Progress: Not met Pt will Transfer Sit to Stand/Stand to Sit: with supervision PT Transfer Goal: Sit to Stand/Stand to Sit - Progress: Not met Pt will Ambulate: >150 feet;with supervision;with rolling walker PT Goal: Ambulate - Progress: Not met Pt will Go Up / Down Stairs: Flight;with min assist;with least restrictive assistive device PT Goal: Up/Down Stairs - Progress: Not met Additional Goals Additional Goal #1: UP/down 1+ 1 steps with RW and min assist PT Goal: Additional Goal #1 - Progress: Not met  PT Evaluation Precautions/Restrictions  Restrictions Weight Bearing Restrictions: No Prior Functioning  Home Living Lives With: Family Receives Help From: Family Type of Home: House Home Layout: Two level Alternate Level Stairs-Rails: Left Alternate Level Stairs-Number of Steps: 15 Home Access:  (1+1 steps to enter) Prior Function Level of Independence: Independent with basic ADLs;Independent with homemaking with ambulation;Independent with gait;Independent with transfers Cognition Cognition Arousal/Alertness: Awake/alert Overall Cognitive Status: Appears within functional limits for tasks assessed Orientation Level: Oriented X4 Sensation/Coordination Coordination Gross Motor Movements are Fluid and Coordinated: Yes Extremity Assessment RUE Assessment RUE Assessment: Within Functional Limits LUE Assessment LUE Assessment: Within Functional Limits RLE Assessment RLE Assessment: Within Functional Limits LLE Assessment LLE Assessment: Within Functional Limits Mobility (including Balance) Bed Mobility Bed Mobility: Yes Supine to Sit: 4: Min assist;3: Mod assist Transfers Transfers: Yes Sit to Stand: 4: Min assist;3: Mod assist;With upper extremity assist;From bed Sit to Stand Details  (indicate cue type and reason): cues for LE position and use of UEs Stand to Sit: 4: Min assist;With armrests;With  upper extremity assist;To chair/3-in-1 Stand to Sit Details: cues for LE position and use of UEs Ambulation/Gait Ambulation/Gait: Yes Ambulation/Gait Assistance: 4: Min assist Ambulation/Gait Assistance Details (indicate cue type and reason): cues for sequence, posture and position from RW Ambulation Distance (Feet): 140 Feet Assistive device: Rolling walker Gait Pattern: Step-to pattern;Step-through pattern    Exercise  Total Joint Exercises Ankle Circles/Pumps: AROM;15 reps;Supine;Both Heel Slides: AAROM;Supine;15 reps;Right Hip ABduction/ADduction: AAROM;Supine;15 reps;Right End of Session PT - End of Session Equipment Utilized During Treatment: Gait belt Activity Tolerance: Patient tolerated treatment well Patient left: in chair;with call bell in reach;with family/visitor present General Behavior During Session: [facility] for tasks performed Cognition: [facility] for tasks performed  Mahalia Dykes 01/21/2011, 1:21 PM

## 2011-01-21 NOTE — Progress Notes (Signed)
Subjective: 1 Day Post-Op Procedure(s) (LRB): TOTAL HIP ARTHROPLASTY ANTERIOR APPROACH (Right)   Patient reports pain as severe. No events.  Objective:   VITALS:   Filed Vitals:   01/21/11 0450  BP: 117/66  Pulse: 93  Temp: 101 F (38.3 C)  Resp: 18    Neurovascular intact Dorsiflexion/Plantar flexion intact Incision: dressing C/D/I No cellulitis present Compartment soft  LABS  Basename 01/21/11 0350  HGB 9.5*  HCT 28.6*  WBC 7.2  PLT 239     Basename 01/21/11 0350  NA 130*  K 3.9  BUN 7  CREATININE 0.61  GLUCOSE 124*    Assessment/Plan: 1 Day Post-Op Procedure(s) (LRB): TOTAL HIP ARTHROPLASTY ANTERIOR APPROACH (Right)  Pt is able to use her personal Biofreeze and prescribed Pt changed to Oxycodone, APAP and Toradol Advance diet Up with therapy D/C home with home health PT on Thurs/Fri   Anastasio Auerbach. Vicktoria Muckey   PAC  01/21/2011, 9:19 AM

## 2011-01-21 NOTE — Progress Notes (Signed)
Occupational Therapy Evaluation Patient Details Name: Julie Tapia MRN: [id number] DOB: 1950/12/31 Today's Date: 01/21/2011 15:09-15:42 evII Problem List: There is no problem list on file for this patient.   Past Medical History:  Past Medical History  Diagnosis Date  . Complication of anesthesia 01-16-11    Pt. very slow to awaken and was days before feeling like herself.  . 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 ICA  aneurysm  . Asthma 01-16-11    chronic asthmatic-child yrs, no severe issues in adult yrs", occ. tight chested-"no inhalers in yrs.  . Arthritis 01-16-11    osteoarthritis,-knees, hips, ankles. 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 Abdominal hysterectomy   . Reduction mammaplasty 01-16-11    Bil. '86( problems with s/p surgical clot at rt. breast drainage site)  . Joint replacement 01-16-11    Rt. great toe ball joint replacement-metal  . Breast cyst excision 01-16-11    several excisions in the past  . Total hip arthroplasty 01/20/2011    Procedure: TOTAL HIP ARTHROPLASTY ANTERIOR APPROACH;  Surgeon: Shelda Pal;  Location: [facility];  Service: Orthopedics;  Laterality: Right;    OT Assessment/Plan/Recommendation OT Assessment Clinical Impression Statement: Pleasant 60 year  old female admitted for elective right total hip replacement.  Pt presents with minimal pain but likewise is currently supervision for all simulated ADL's using appropriate AE.   Pt will have initial 24hr supervision at d/c from family.  Pt and family have been educated on DME use and AE use as well as availabilty for purchase.  Feel patient does not need any further OT services at this time.  OT Recommendation/Assessment: Patient does not need any further OT services Barriers to Discharge: Inaccessible home environment Barriers to Discharge  Comments: Pt has one flight of steps to access the bedrooms and bathrooms on the second floor. OT Recommendation Follow Up Recommendations: None Equipment Recommended: None recommended by OT OT Goals    OT Evaluation Precautions/Restrictions  Precautions Required Braces or Orthoses: No Restrictions Weight Bearing Restrictions: No Prior Functioning Home Living Lives With: Family Receives Help From: Family Type of Home: House Home Layout: Two level Alternate Level Stairs-Rails: Left Alternate Level Stairs-Number of Steps: 15 Home Access:  (1+1 steps to enter) Bathroom Shower/Tub: Walk-in shower;Door (Pt has grab bars and hand held shower) Bathroom Toilet: Standard Bathroom Accessibility: Yes (Only half bath on the first level.) How Accessible: Accessible via walker Home Adaptive Equipment: Bedside commode/3-in-1;Walker - rolling;Shower chair with back Prior Function Level of Independence: Independent with basic ADLs;Independent with homemaking with ambulation;Independent with gait;Independent with transfers ADL ADL Eating/Feeding: Simulated;Independent Where Assessed - Eating/Feeding: Edge of bed Grooming: Simulated;Supervision/safety Where Assessed - Grooming: Standing at sink Upper Body Bathing: Simulated;Supervision/safety Where Assessed - Upper Body Bathing: Unsupported;Sitting, bed Lower Body Bathing: Simulated;Supervision/safety Lower Body Bathing Details (indicate cue type and reason): Pt will be able to utilize reacher and LH sponge for bathing. Where Assessed - Lower Body Bathing: Sit to stand from bed Upper Body Dressing: Simulated;Set up Where Assessed - Upper Body Dressing: Unsupported;Sitting, bed Lower Body Dressing: Simulated;Supervision/safety Lower Body Dressing Details (indicate cue type and reason): Pt utilized reacher and sockaide for donning /doffing gripper socks. Where Assessed - Lower Body Dressing: Sit to stand from bed Toilet Transfer:  Simulated;Supervision/safety Toilet Transfer Method: Freight forwarder Equipment: Raised toilet  seat with arms (or 3-in-1 over toilet) Toileting - Clothing Manipulation: Simulated;Supervision/safety Where Assessed - Toileting Clothing Manipulation: Sit to stand from 3-in-1 or toilet Toileting - Hygiene: Simulated;Supervision/safety Where Assessed - Toileting Hygiene: Sit to stand from 3-in-1 or toilet Tub/Shower Transfer: Performed;Supervision/safety Tub/Shower Transfer Details (indicate cue type and reason): Pt able to step posterior over edge of walk-in shower. Tub/Shower Transfer Method: Ecologist without back Equipment Used: Reacher;Long-handled sponge;Sock aid;Rolling walker (3:1) Vision/Perception  Vision - History Baseline Vision: Wears glasses all the time Perception Perception: Within Functional Limits Praxis Praxis: Intact Cognition Cognition Arousal/Alertness: Awake/alert Overall Cognitive Status: Appears within functional limits for tasks assessed Orientation Level: Oriented X4 Sensation/Coordination Sensation Light Touch: Appears Intact Extremity Assessment RUE Assessment RUE Assessment: Not tested (Pt reports no issues with UE strength or AROM.) LUE Assessment LUE Assessment: Not tested (Pt reports no issues with UE AROM or strength.) Mobility  Bed Mobility Bed Mobility: No Supine to Sit: 5: Supervision;4: Min assist Transfers Transfers: Yes Sit to Stand: 5: Supervision;From elevated surface;With upper extremity assist;With armrests;From chair/3-in-1 Sit to Stand Details (indicate cue type and reason): cues for LE position and use of UEs Stand to Sit: 4: Min assist;To bed;With upper extremity assist Stand to Sit Details: cues for LE position and use of UEs Exercises   End of Session OT - End of Session Equipment Utilized During Treatment: Other (comment) (RW, 3:1, sockaide, reacher, ) Activity Tolerance:  Patient tolerated treatment well Patient left: in chair;with call bell in reach;with family/visitor present General Behavior During Session: [facility] for tasks performed Cognition: [facility] for tasks performed   Gavin Faivre OTR/L 01/21/2011, 4:27 PM  Pager number [phone]

## 2011-01-22 LAB — CBC
MCHC: 34 g/dL (ref 30.0–36.0)
RDW: 12.8 % (ref 11.5–15.5)
WBC: 6.4 10*3/uL (ref 4.0–10.5)

## 2011-01-22 LAB — BASIC METABOLIC PANEL
GFR calc Af Amer: >90 mL/min (ref >90)
GFR calc non Af Amer: >90 mL/min (ref >90)
Potassium: 4 mEq/L (ref 3.5–5.1)
Sodium: 138 mEq/L (ref 135–145)

## 2011-01-22 MED ORDER — ACETAMINOPHEN 500 MG PO TABS: 1000.0000 mg | ORAL_TABLET | Freq: Three times a day (TID) | ORAL | Status: AC

## 2011-01-22 MED ORDER — FERROUS SULFATE 325 (65 FE) MG PO TABS: 325.0000 mg | ORAL_TABLET | Freq: Three times a day (TID) | ORAL | Status: DC

## 2011-01-22 MED ORDER — OXYCODONE HCL 5 MG PO TABS: 5.0000 mg | ORAL_TABLET | ORAL | Status: AC | PRN

## 2011-01-22 MED ORDER — POLYETHYLENE GLYCOL 3350 17 G PO PACK: 17.0000 g | PACK | Freq: Two times a day (BID) | ORAL | Status: AC

## 2011-01-22 MED ORDER — ASPIRIN 325 MG PO TABS: 325.0000 mg | ORAL_TABLET | Freq: Two times a day (BID) | ORAL | Status: DC

## 2011-01-22 MED ORDER — METHOCARBAMOL 500 MG PO TABS: 500.0000 mg | ORAL_TABLET | Freq: Four times a day (QID) | ORAL | Status: AC | PRN

## 2011-01-22 MED ORDER — RIVAROXABAN 10 MG PO TABS: 10.0000 mg | ORAL_TABLET | ORAL | Status: AC

## 2011-01-22 MED ORDER — DSS 100 MG PO CAPS: 100.0000 mg | ORAL_CAPSULE | Freq: Two times a day (BID) | ORAL | Status: AC

## 2011-01-22 NOTE — Progress Notes (Signed)
Subjective: 2 Days Post-Op Procedure(s) (LRB): TOTAL HIP ARTHROPLASTY ANTERIOR APPROACH (Right)   Patient reports pain as mild.  Objective:   VITALS:   Filed Vitals:   01/22/11 0458  BP: 113/71  Pulse: 93  Temp: 99.8 F (37.7 C)  Resp: 18    Neurovascular intact Dorsiflexion/Plantar flexion intact Incision: dressing C/D/I No cellulitis present Compartment soft  LABS  Basename 01/22/11 0403 01/21/11 0350  HGB 9.1* 9.5*  HCT 26.8* 28.6*  WBC 6.4 7.2  PLT 212 239     Basename 01/22/11 0403 01/21/11 0350  NA 138 130*  K 4.0 3.9  BUN 7 7  CREATININE 0.70 0.61  GLUCOSE 103* 124*    Assessment/Plan: 2 Days Post-Op Procedure(s) (LRB): TOTAL HIP ARTHROPLASTY ANTERIOR APPROACH (Right)   Up with therapy Discharge home with home health after having PT today   Anastasio Auerbach. Riona Lahti   PAC  01/22/2011, 7:48 AM

## 2011-01-22 NOTE — Plan of Care (Signed)
Problem: Phase I Progression Outcomes Goal: Initial discharge plan identified Outcome: Completed/Met Date Met:  01/22/11 HOME

## 2011-01-22 NOTE — H&P (Signed)
Physician Discharge Summary  Patient ID: Julie Tapia MRN: [id number] DOB/AGE: 59-14-1952 59 y.o.  Admit date: 01/20/2011 Discharge date: 01/22/2011  Procedures:  Procedure(s) (LRB): TOTAL HIP ARTHROPLASTY ANTERIOR APPROACH (Right)  Attending Physician: Shelda Pal   Admission Diagnoses:  1. Right hip pain/osteoarthritis  Discharge Diagnoses:  1. Hypercholesterolemia  2. Aneurysm  3. Degenerative disc disease  4. Arthritis 5. S/P right total hip arthroplasty  HPI: Patient is a 60 year old white female in no acute distress. Presenting complaint right hip pain for many years. For the last month 9 months it has increased significantly. Patient has tried several conservative treatments, all of which have failed to alleviate her symptoms. X-rays in the clinic show osteoarthritic changes of her right hip. Various options were discussed the patient. Patient wishes to proceed with surgery. Risks, benefits and expectations of the procedure were discussed the patient. Patient understands risks, benefits and expectations and wishes to proceed with a right total hip arthroplasty, anterior approach per Dr. Charlann Boxer that [facility] on 01/20/2011   PCP: Willy Eddy, [place]   Discharged Condition: good  Hospital Course:  Patient underwent the above stated procedure on 01/20/2011. Patient tolerated the procedure well and brought to the recovery room in good condition and subsequently to the floor.  POD #1 BP: 117/66 ; Pulse: 93 ; Temp: 101 F Pt's foley was removed, as well as the hemovac drain removed. Neurovascular intact, dorsiflexion/Plantar flexion intact, incision: dressing C/D/I, no cellulitis present and compartment soft.   LABS  Basename  01/21/11 0350   HGB  9.5*   HCT  28.6*    POD #2  BP: 113/71 ; Pulse: 93 ; Temp: 99.8 F Neurovascular intact, dorsiflexion/Plantar flexion intact, incision: dressing C/D/I, no cellulitis present and compartment soft. IV changed to saline  lock.  LABS  Basename  01/22/11 0403    HGB  9.1*    HCT  26.8*      Discharge Exam: Extremities: Homans sign is negative, no sign of DVT, no edema, redness or tenderness in the calves or thighs and no ulcers, gangrene or trophic changes  Disposition: Home with Home Health PT Maintain surgical dressing for 8 days, then replace with gauze and tape. Keep the area dry and clean until follow up. Follow up in 2 weeks at [facility]. Call with any questions or concerns.    Discharge Orders    Future Orders Please Complete By Expires   Call MD / Call 911      Comments:   If you experience chest pain or shortness of breath, CALL 911 and be transported to the hospital emergency room.  If you develope a fever above 101 F, pus (white drainage) or increased drainage or redness at the wound, or calf pain, call your surgeon's office.   Constipation Prevention      Comments:   Drink plenty of fluids.  Prune juice may be helpful.  You may use a stool softener, such as Colace (over the counter) 100 mg twice a day.  Use MiraLax (over the counter) for constipation as needed.   Increase activity slowly as tolerated      Weight Bearing as taught in Physical Therapy      Comments:   Use a walker or crutches as instructed.   Discharge instructions      Comments:   Maintain surgical dressing for 8 days, then replace with gauze and tape. Keep the area dry and clean until follow up. Follow up in 2 weeks at  [facility]. Call with any questions or concerns.     Follow the hip precautions as taught in Physical Therapy      Change dressing      Comments:   Maintain surgical dressing for 8 days, then replace with gauze and tape. Keep the area dry and clean until follow up in 2 weeks.    TED hose      Comments:   Use stockings (TED hose) for 2 weeks on both leg(s).  You may remove them at night for sleeping.     Current Discharge Medication List    START taking these medications     Details  acetaminophen (TYLENOL) 500 MG tablet Take 2 tablets (1,000 mg total) by mouth every 8 (eight) hours. Qty: 30 tablet    docusate sodium 100 MG CAPS Take 100 mg by mouth 2 (two) times daily. Qty: 10 capsule    ferrous sulfate 325 (65 FE) MG tablet Take 1 tablet (325 mg total) by mouth 3 (three) times daily after meals.    methocarbamol (ROBAXIN) 500 MG tablet Take 1 tablet (500 mg total) by mouth every 6 (six) hours as needed.    oxyCODONE (OXY IR/ROXICODONE) 5 MG immediate release tablet Take 1-2 tablets (5-10 mg total) by mouth every 4 (four) hours as needed. Qty: 80 tablet, Refills: 0    polyethylene glycol (MIRALAX / GLYCOLAX) packet Take 17 g by mouth 2 (two) times daily. Qty: 14 each    rivaroxaban (XARELTO) 10 MG TABS tablet Take 1 tablet (10 mg total) by mouth daily. Qty: 12 tablet, Refills: 0      CONTINUE these medications which have CHANGED   Details  aspirin 325 MG tablet Take 1 tablet (325 mg total) by mouth 2 (two) times daily.      CONTINUE these medications which have NOT CHANGED   Details  cholecalciferol (VITAMIN D) 1000 UNITS tablet Take 1,000 Units by mouth daily.      diphenhydrAMINE (BENADRYL) 25 mg capsule Take 25 mg by mouth every 6 (six) hours as needed. Allergies      EPINEPHrine (EPIPEN JR) 0.15 MG/0.3ML injection Inject 0.15 mg into the muscle as needed. Allergy reaction     Homeopathic Products (ARNICARE) GEL Apply 1 application topically 4 (four) times daily as needed. pain     Magnesium 400 MG CAPS Take 1 capsule by mouth daily.      Menthol, Topical Analgesic, (BIOFREEZE) 4 % GEL Apply 1 application topically 4 (four) times daily as needed. pain     Multiple Vitamins-Minerals (MULTIVITAMINS THER. W/MINERALS) TABS Take 1 tablet by mouth daily.      omega-3 acid ethyl esters (LOVAZA) 1 G capsule Take 2 g by mouth daily.      PARoxetine (PAXIL) 10 MG tablet Take 5 mg by mouth every morning.      tetrahydrozoline 0.05 % ophthalmic  solution Place 3-4 drops into both eyes 2 (two) times daily as needed. Dry eyes      STOP taking these medications     cephALEXin (KEFLEX) 500 MG capsule      ibuprofen (ADVIL,MOTRIN) 200 MG tablet          Signed: Anastasio Auerbach. Lorielle Boehning   PAC  01/22/2011, 7:51 AM

## 2011-01-22 NOTE — Plan of Care (Signed)
Problem: Consults Goal: Total Joint Replacement Patient Education See Patient Education Module for education specifics.  Outcome: Completed/Met Date Met:  01/22/11 Attended  Joint pre-op class

## 2011-01-22 NOTE — Progress Notes (Signed)
Physical Therapy Treatment Patient Details Name: Julie Tapia MRN: [id number] DOB: 06-Oct-1950 Today's Date: 01/22/2011 0757 - 0827  TE, GT PT Assessment/Plan  PT - Assessment/Plan PT Plan: Discharge plan remains appropriate PT Frequency: 7X/week Follow Up Recommendations: Home health PT PT Goals  Acute Rehab PT Goals PT Goal: Supine/Side to Sit - Progress: Met PT Goal: Sit to Supine/Side - Progress: Met PT Transfer Goal: Sit to Stand/Stand to Sit - Progress: Met PT Goal: Ambulate - Progress: Met PT Goal: Up/Down Stairs - Progress: Met  PT Treatment Precautions/Restrictions  Precautions Required Braces or Orthoses: No Restrictions Weight Bearing Restrictions: No Mobility (including Balance) Bed Mobility Bed Mobility: Yes Supine to Sit: 6: Modified independent (Device/Increase time) Transfers Sit to Stand: 5: Supervision Sit to Stand Details (indicate cue type and reason): cues for use of UEs Stand to Sit: 5: Supervision Ambulation/Gait Ambulation/Gait Assistance: 5: Supervision Ambulation Distance (Feet): 200 Feet Assistive device: Rolling walker Gait Pattern: Step-through pattern    Exercise  Total Joint Exercises Ankle Circles/Pumps: AROM;15 reps;Supine;Both Gluteal Sets: AROM;10 reps;Supine;Both Heel Slides: AAROM;Supine;15 reps;Right Hip ABduction/ADduction: AAROM;Supine;15 reps;Right End of Session General Behavior During Session: [facility] for tasks performed Cognition: [facility] for tasks performed  Darriel Sinquefield 01/22/2011, 8:32 AM

## 2011-01-22 NOTE — Progress Notes (Signed)
Physical Therapy Treatment Patient Details Name: Julie Tapia MRN: [id number] DOB: 1950/04/10 Today's Date: 01/22/2011  PT Assessment/Plan  PT - Assessment/Plan PT Plan: Discharge plan remains appropriate PT Frequency: 7X/week Follow Up Recommendations: Home health PT PT Goals  Acute Rehab PT Goals PT Goal: Supine/Side to Sit - Progress: Met PT Goal: Sit to Supine/Side - Progress: Met PT Transfer Goal: Sit to Stand/Stand to Sit - Progress: Met PT Goal: Ambulate - Progress: Met PT Goal: Up/Down Stairs - Progress: Met  PT Treatment Precautions/Restrictions  Precautions Required Braces or Orthoses: No Restrictions Weight Bearing Restrictions: No Mobility (including Balance) Bed Mobility Bed Mobility: Yes Supine to Sit: 6: Modified independent (Device/Increase time) Transfers Sit to Stand: 5: Supervision Sit to Stand Details (indicate cue type and reason): cues for use of UEs Stand to Sit: 5: Supervision Ambulation/Gait Ambulation/Gait Assistance: 5: Supervision Ambulation Distance (Feet): 200 Feet Assistive device: Rolling walker Gait Pattern: Step-through pattern Stairs: Yes Stairs Assistance: 4: Min assist Stairs Assistance Details (indicate cue type and reason): sequence Stair Management Technique: One rail Right;Step to pattern Number of Stairs: 10     Exercise  Total Joint Exercises Ankle Circles/Pumps: AROM;15 reps;Supine;Both Gluteal Sets: AROM;10 reps;Supine;Both Heel Slides: AAROM;Supine;15 reps;Right Hip ABduction/ADduction: AAROM;Supine;15 reps;Right End of Session General Behavior During Session: [facility] for tasks performed Cognition: [facility] for tasks performed  Tessa Seaberry 01/22/2011, 8:36 AM

## 2011-01-23 NOTE — Discharge Summary (Signed)
Patient ID:  Julie Tapia  MRN: [id number]  DOB/AGE: 1950-10-02 60 y.o.   Admit date: 01/20/2011   Discharge date: 01/22/2011   Procedures:  Procedure(s) (LRB):  TOTAL HIP ARTHROPLASTY ANTERIOR APPROACH (Right)   Attending Physician: Shelda Pal   Admission Diagnoses:  1. Right hip pain/osteoarthritis   Discharge Diagnoses:  1. Hypercholesterolemia  2. Aneurysm  3. Degenerative disc disease  4. Arthritis  5. S/P right total hip arthroplasty   HPI: Patient is a 60 year old white female in no acute distress. Presenting complaint right hip pain for many years. For the last month 9 months it has increased significantly. Patient has tried several conservative treatments, all of which have failed to alleviate her symptoms. X-rays in the clinic show osteoarthritic changes of her right hip. Various options were discussed the patient. Patient wishes to proceed with surgery. Risks, benefits and expectations of the procedure were discussed the patient. Patient understands risks, benefits and expectations and wishes to proceed with a right total hip arthroplasty, anterior approach per Dr. Charlann Boxer that [facility] on 01/20/2011   PCP: Willy Eddy, [place]   Discharged Condition: good   Hospital Course: Patient underwent the above stated procedure on 01/20/2011. Patient tolerated the procedure well and brought to the recovery room in good condition and subsequently to the floor.   POD #1  BP: 117/66 ; Pulse: 93 ; Temp: 101 F  Pt's foley was removed, as well as the hemovac drain removed.  Neurovascular intact, dorsiflexion/Plantar flexion intact, incision: dressing C/D/I, no cellulitis present and compartment soft.   LABS  Basename  01/21/11 0350   HGB  9.5*   HCT  28.6*    POD #2  BP: 113/71 ; Pulse: 93 ; Temp: 99.8 F  Neurovascular intact, dorsiflexion/Plantar flexion intact, incision: dressing C/D/I, no cellulitis present and compartment soft. IV changed to saline lock.   LABS    Basename  01/22/11 0403    HGB  9.1*    HCT  26.8*     Discharge Exam:  Extremities: Homans sign is negative, no sign of DVT, no edema, redness or tenderness in the calves or thighs and no ulcers, gangrene or trophic changes   Disposition: Home with Home Health PT  Maintain surgical dressing for 8 days, then replace with gauze and tape. Keep the area dry and clean until follow up. Follow up in 2 weeks at [facility]. Call with any questions or concerns.   Discharge Orders    Future Orders  Please Complete By  Expires    Call MD / Call 911      Comments:    If you experience chest pain or shortness of breath, CALL 911 and be transported to the hospital emergency room. If you develope a fever above 101 F, pus (white drainage) or increased drainage or redness at the wound, or calf pain, call your surgeon's office.    Constipation Prevention      Comments:    Drink plenty of fluids. Prune juice may be helpful. You may use a stool softener, such as Colace (over the counter) 100 mg twice a day. Use MiraLax (over the counter) for constipation as needed.    Increase activity slowly as tolerated      Weight Bearing as taught in Physical Therapy      Comments:    Use a walker or crutches as instructed.    Discharge instructions      Comments:    Maintain surgical dressing for 8  days, then replace with gauze and tape. Keep the area dry and clean until follow up. Follow up in 2 weeks at [facility]. Call with any questions or concerns.    Follow the hip precautions as taught in Physical Therapy      Change dressing      Comments:    Maintain surgical dressing for 8 days, then replace with gauze and tape. Keep the area dry and clean until follow up in 2 weeks.    TED hose      Comments:    Use stockings (TED hose) for 2 weeks on both leg(s). You may remove them at night for sleeping.      Current Discharge Medication List     START taking these medications    Details    acetaminophen (TYLENOL) 500 MG tablet  Take 2 tablets (1,000 mg total) by mouth every 8 (eight) hours.  Qty: 30 tablet     docusate sodium 100 MG CAPS  Take 100 mg by mouth 2 (two) times daily.  Qty: 10 capsule     ferrous sulfate 325 (65 FE) MG tablet  Take 1 tablet (325 mg total) by mouth 3 (three) times daily after meals.     methocarbamol (ROBAXIN) 500 MG tablet  Take 1 tablet (500 mg total) by mouth every 6 (six) hours as needed.     oxyCODONE (OXY IR/ROXICODONE) 5 MG immediate release tablet  Take 1-2 tablets (5-10 mg total) by mouth every 4 (four) hours as needed.  Qty: 80 tablet, Refills: 0     polyethylene glycol (MIRALAX / GLYCOLAX) packet  Take 17 g by mouth 2 (two) times daily.  Qty: 14 each     rivaroxaban (XARELTO) 10 MG TABS tablet  Take 1 tablet (10 mg total) by mouth daily.  Qty: 12 tablet, Refills: 0       CONTINUE these medications which have CHANGED    Details   aspirin 325 MG tablet  Take 1 tablet (325 mg total) by mouth 2 (two) times daily.       CONTINUE these medications which have NOT CHANGED    Details   cholecalciferol (VITAMIN D) 1000 UNITS tablet  Take 1,000 Units by mouth daily.     diphenhydrAMINE (BENADRYL) 25 mg capsule  Take 25 mg by mouth every 6 (six) hours as needed. Allergies     EPINEPHrine (EPIPEN JR) 0.15 MG/0.3ML injection  Inject 0.15 mg into the muscle as needed. Allergy reaction     Homeopathic Products (ARNICARE) GEL  Apply 1 application topically 4 (four) times daily as needed. pain     Magnesium 400 MG CAPS  Take 1 capsule by mouth daily.     Menthol, Topical Analgesic, (BIOFREEZE) 4 % GEL  Apply 1 application topically 4 (four) times daily as needed. pain     Multiple Vitamins-Minerals (MULTIVITAMINS THER. W/MINERALS) TABS  Take 1 tablet by mouth daily.     omega-3 acid ethyl esters (LOVAZA) 1 G capsule  Take 2 g by mouth daily.     PARoxetine (PAXIL) 10 MG tablet  Take 5 mg by mouth every morning.     tetrahydrozoline  0.05 % ophthalmic solution  Place 3-4 drops into both eyes 2 (two) times daily as needed. Dry eyes       STOP taking these medications      cephALEXin (KEFLEX) 500 MG capsule       ibuprofen (ADVIL,MOTRIN) 200 MG tablet  Signed:  Anastasio Auerbach. Bonnell Placzek PAC  01/22/2011, 7:51 AM   Cosigned by: Shelda Pal [01/22/2011 10:42 PM]

## 2012-09-06 IMAGING — CR DG PELVIS 1-2V
1 series · 1 of 1 positions shown · non-contrast
Comparison: 07/28/2007 CT.

CLINICAL DATA: Postop right hip.

RIGHT HIP - 1 VIEW,PELVIS - 1-2 VIEW

[AP]
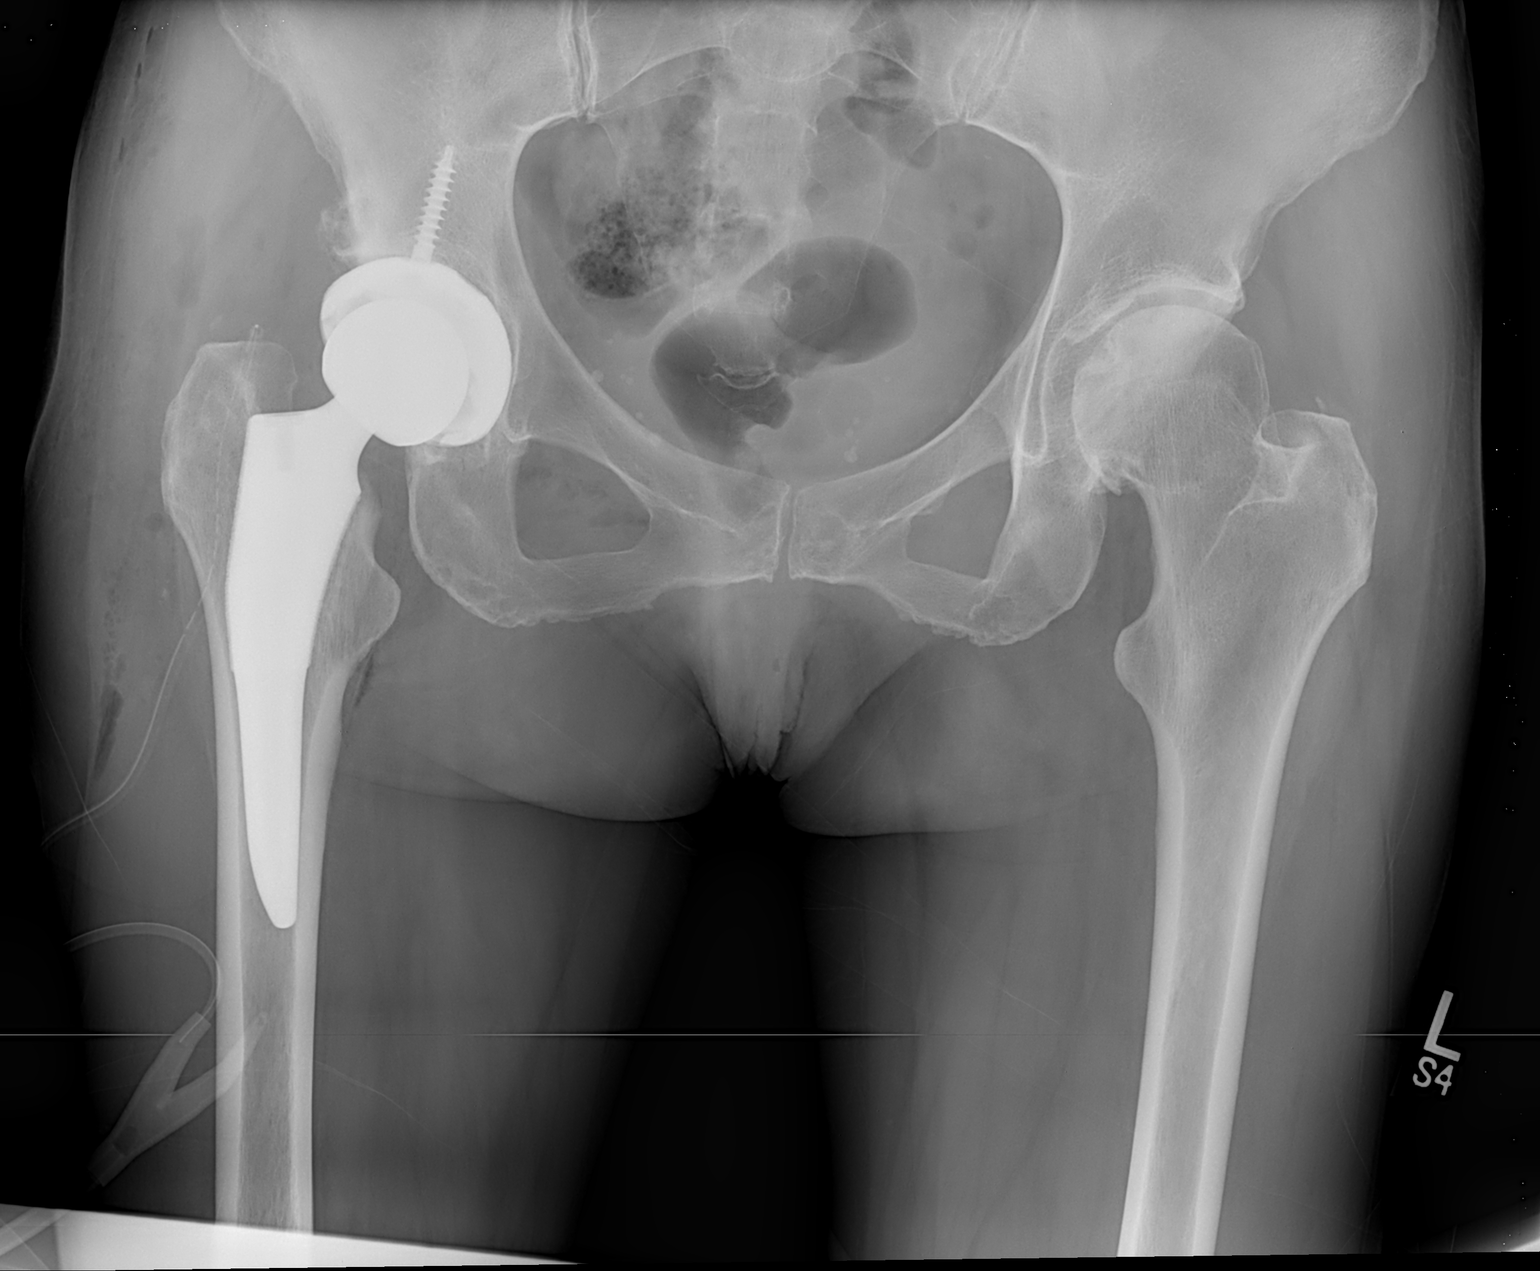

[1 of 1 positions shown; findings below may reference images not displayed]

FINDINGS: Post right hip replacement appearing satisfactory
position.  Surgical drain is in place.

Left hip joint degenerative changes.  Left femoral head avascular
necrosis not excluded.
IMPRESSION: Post right hip replacement appearing satisfactory position.
Surgical drain is in place.

Left hip joint degenerative changes.  Left femoral head avascular
necrosis not excluded.

## 2012-09-06 IMAGING — CR DG HIP 1V*R*
1 series · 1 of 1 positions shown · non-contrast
Comparison: 07/28/2007 CT.

CLINICAL DATA: Postop right hip.

RIGHT HIP - 1 VIEW,PELVIS - 1-2 VIEW

[AP]
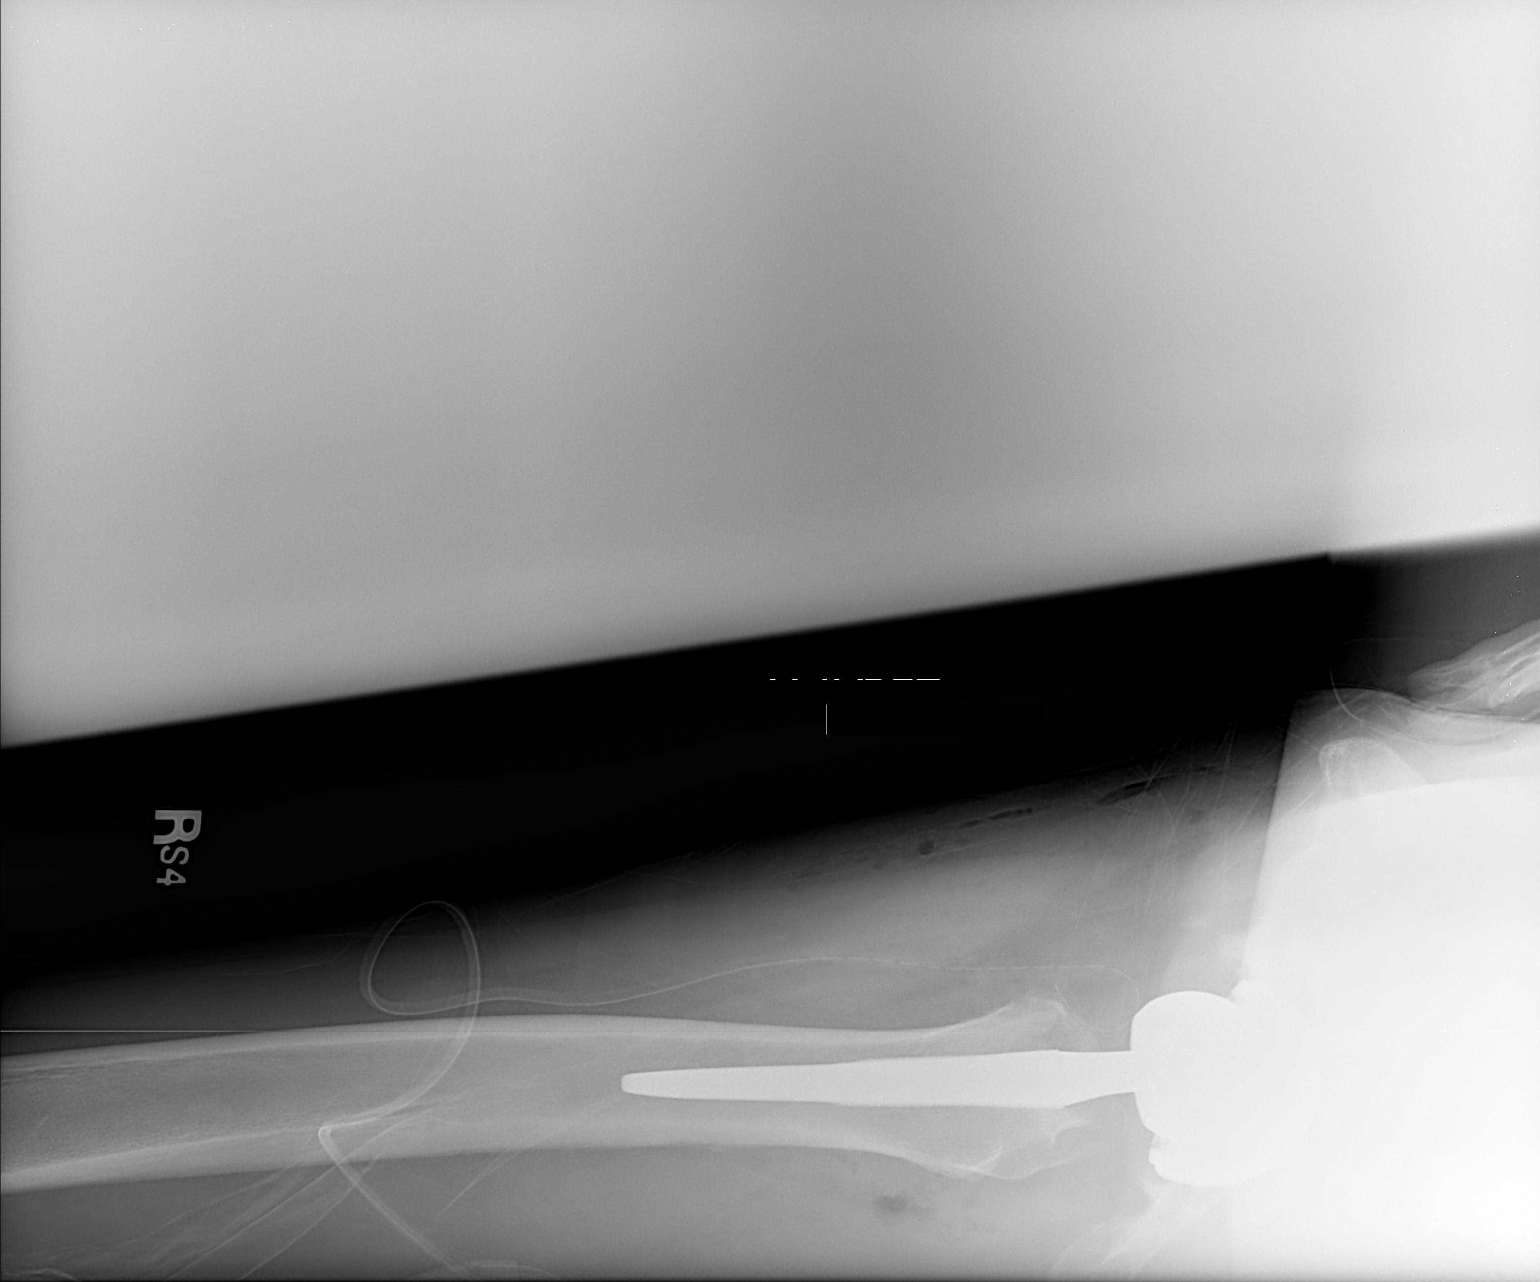

[1 of 1 positions shown; findings below may reference images not displayed]

FINDINGS: Post right hip replacement appearing satisfactory
position.  Surgical drain is in place.

Left hip joint degenerative changes.  Left femoral head avascular
necrosis not excluded.
IMPRESSION: Post right hip replacement appearing satisfactory position.
Surgical drain is in place.

Left hip joint degenerative changes.  Left femoral head avascular
necrosis not excluded.

## 2016-06-29 DIAGNOSIS — M25561 Pain in right knee: Secondary | ICD-10-CM | POA: Diagnosis not present

## 2016-06-29 DIAGNOSIS — M25562 Pain in left knee: Secondary | ICD-10-CM | POA: Diagnosis not present

## 2016-06-29 DIAGNOSIS — M94262 Chondromalacia, left knee: Secondary | ICD-10-CM | POA: Diagnosis not present

## 2016-06-29 DIAGNOSIS — M94261 Chondromalacia, right knee: Secondary | ICD-10-CM | POA: Diagnosis not present

## 2016-07-06 DIAGNOSIS — M2241 Chondromalacia patellae, right knee: Secondary | ICD-10-CM | POA: Diagnosis not present

## 2016-07-06 DIAGNOSIS — M2242 Chondromalacia patellae, left knee: Secondary | ICD-10-CM | POA: Diagnosis not present

## 2016-07-08 DIAGNOSIS — M2241 Chondromalacia patellae, right knee: Secondary | ICD-10-CM | POA: Diagnosis not present

## 2016-07-08 DIAGNOSIS — M2242 Chondromalacia patellae, left knee: Secondary | ICD-10-CM | POA: Diagnosis not present

## 2016-07-14 DIAGNOSIS — M2241 Chondromalacia patellae, right knee: Secondary | ICD-10-CM | POA: Diagnosis not present

## 2016-07-14 DIAGNOSIS — M2242 Chondromalacia patellae, left knee: Secondary | ICD-10-CM | POA: Diagnosis not present

## 2016-07-17 DIAGNOSIS — M2241 Chondromalacia patellae, right knee: Secondary | ICD-10-CM | POA: Diagnosis not present

## 2016-07-17 DIAGNOSIS — M2242 Chondromalacia patellae, left knee: Secondary | ICD-10-CM | POA: Diagnosis not present

## 2016-07-21 DIAGNOSIS — M2242 Chondromalacia patellae, left knee: Secondary | ICD-10-CM | POA: Diagnosis not present

## 2016-07-21 DIAGNOSIS — M2241 Chondromalacia patellae, right knee: Secondary | ICD-10-CM | POA: Diagnosis not present

## 2016-07-24 DIAGNOSIS — M2242 Chondromalacia patellae, left knee: Secondary | ICD-10-CM | POA: Diagnosis not present

## 2016-07-24 DIAGNOSIS — M2241 Chondromalacia patellae, right knee: Secondary | ICD-10-CM | POA: Diagnosis not present

## 2016-07-28 DIAGNOSIS — M2242 Chondromalacia patellae, left knee: Secondary | ICD-10-CM | POA: Diagnosis not present

## 2016-07-28 DIAGNOSIS — M2241 Chondromalacia patellae, right knee: Secondary | ICD-10-CM | POA: Diagnosis not present

## 2016-07-30 DIAGNOSIS — M2242 Chondromalacia patellae, left knee: Secondary | ICD-10-CM | POA: Diagnosis not present

## 2016-07-30 DIAGNOSIS — M2241 Chondromalacia patellae, right knee: Secondary | ICD-10-CM | POA: Diagnosis not present

## 2016-08-04 DIAGNOSIS — M2241 Chondromalacia patellae, right knee: Secondary | ICD-10-CM | POA: Diagnosis not present

## 2016-08-04 DIAGNOSIS — M2242 Chondromalacia patellae, left knee: Secondary | ICD-10-CM | POA: Diagnosis not present

## 2016-08-06 DIAGNOSIS — M2241 Chondromalacia patellae, right knee: Secondary | ICD-10-CM | POA: Diagnosis not present

## 2016-08-06 DIAGNOSIS — M2242 Chondromalacia patellae, left knee: Secondary | ICD-10-CM | POA: Diagnosis not present

## 2016-08-19 DIAGNOSIS — M94262 Chondromalacia, left knee: Secondary | ICD-10-CM | POA: Diagnosis not present

## 2016-08-19 DIAGNOSIS — M94261 Chondromalacia, right knee: Secondary | ICD-10-CM | POA: Diagnosis not present

## 2016-08-19 DIAGNOSIS — M25561 Pain in right knee: Secondary | ICD-10-CM | POA: Diagnosis not present

## 2016-08-19 DIAGNOSIS — M25562 Pain in left knee: Secondary | ICD-10-CM | POA: Diagnosis not present

## 2017-01-07 ENCOUNTER — Ambulatory Visit (HOSPITAL_COMMUNITY)
Admission: EM | Admit: 2017-01-07 | Discharge: 2017-01-07 | Disposition: A | Discharge status: Home / Self Care | Payer: Medicare Other | Attending: Family Medicine | Admitting: Family Medicine

## 2017-01-07 ENCOUNTER — Encounter (HOSPITAL_COMMUNITY): Payer: Self-pay | Admitting: Emergency Medicine

## 2017-01-07 ENCOUNTER — Ambulatory Visit (INDEPENDENT_AMBULATORY_CARE_PROVIDER_SITE_OTHER): Payer: Medicare Other

## 2017-01-07 DIAGNOSIS — W231XXA Caught, crushed, jammed, or pinched between stationary objects, initial encounter: Secondary | ICD-10-CM

## 2017-01-07 DIAGNOSIS — S61212A Laceration without foreign body of right middle finger without damage to nail, initial encounter: Secondary | ICD-10-CM

## 2017-01-07 DIAGNOSIS — Z23 Encounter for immunization: Secondary | ICD-10-CM | POA: Diagnosis not present

## 2017-01-07 DIAGNOSIS — S6991XA Unspecified injury of right wrist, hand and finger(s), initial encounter: Secondary | ICD-10-CM | POA: Diagnosis not present

## 2017-01-07 MED ORDER — TETANUS-DIPHTH-ACELL PERTUSSIS 5-2.5-18.5 LF-MCG/0.5 IM SUSP
INTRAMUSCULAR | Status: AC
  Filled 2017-01-07: qty 0.5

## 2017-01-07 MED ORDER — TETANUS-DIPHTH-ACELL PERTUSSIS 5-2.5-18.5 LF-MCG/0.5 IM SUSP
0.5000 mL | Freq: Once | INTRAMUSCULAR | Status: AC
  Administered 2017-01-07: 0.5 mL via INTRAMUSCULAR

## 2017-01-07 NOTE — ED Triage Notes (Signed)
Patient slammed right middle finger in a car door today.  Slight numbness in finger tip.  Patient has brisk cap refill, <2 seconds.

## 2017-01-07 NOTE — ED Provider Notes (Signed)
[facility]    CSN: [id number] Arrival date & time: 01/07/17  1613     History   Chief Complaint Chief Complaint  Patient presents with  . Finger Injury    HPI Julie Tapia is a 66 y.o. female.   The history is provided by the patient.  Laceration  Location:  Finger Length:  2 cm Depth:  Cutaneous Bleeding: venous   Time since incident:  1 hour Laceration mechanism:  Blunt object Pain details:    Quality:  Aching   Severity:  Mild Foreign body present:  No foreign bodies Ineffective treatments:  None tried Tetanus status:  Unknown Associated symptoms: no fever, no focal weakness, no numbness, no rash, no redness and no swelling     Past Medical History:  Diagnosis Date  . 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 Arthritis 01-16-11   osteoarthritis,-knees, hips, ankles. 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 Asthma 01-16-11   chronic asthmatic-child yrs, no severe issues in adult yrs", occ. tight chested-"no inhalers in yrs.  . Complication of anesthesia 01-16-11   Pt. very slow to awaken and was days before feeling like herself.    There are no active problems to display for this patient.   Past Surgical History:  Procedure Laterality Date  . ABDOMINAL HYSTERECTOMY    . BREAST CYST EXCISION  01-16-11   several excisions in the past  . JOINT REPLACEMENT  01-16-11   Rt. great toe ball joint replacement-metal  . REDUCTION MAMMAPLASTY  01-16-11   Bil. '86( problems with s/p surgical clot at rt. breast drainage site)    OB History    No data available       Home Medications    Prior to Admission medications   Medication Sig Start Date End Date Taking? Authorizing Provider  aspirin 325 MG tablet Take 325 mg daily by mouth.   Yes [provider]  cholecalciferol (VITAMIN D) 1000 UNITS tablet Take 1,000 Units by mouth daily.       [provider]  diphenhydrAMINE (BENADRYL) 25 mg capsule Take 25 mg by mouth every 6 (six) hours as needed. Allergies      [provider]  EPINEPHrine (EPIPEN JR) 0.15 MG/0.3ML injection Inject 0.15 mg into the muscle as needed. Allergy reaction     [provider]  ferrous sulfate 325 (65 FE) MG tablet Take 1 tablet (325 mg total) by mouth 3 (three) times daily after meals. 01/22/11 01/22/12  Lanney GinsBabish, Matthew, PA-C  Homeopathic Products (ARNICARE) GEL Apply 1 application topically 4 (four) times daily as needed. pain     [provider]  Magnesium 400 MG CAPS Take 1 capsule by mouth daily.      [provider]  Menthol, Topical Analgesic, (BIOFREEZE) 4 % GEL Apply 1 application topically 4 (four) times daily as needed. pain     [provider]  Multiple Vitamins-Minerals (MULTIVITAMINS THER. W/MINERALS) TABS Take 1 tablet by mouth daily.      [provider]  omega-3 acid ethyl esters (LOVAZA) 1 G capsule Take 2 g by mouth daily.      [provider]  PARoxetine (PAXIL) 10 MG tablet Take 5 mg by mouth every morning.      [provider]  tetrahydrozoline 0.05 % ophthalmic solution Place 3-4 drops into both eyes 2 (two) times  daily as needed. Dry eyes    [provider]    Family History No family history on file.  Social History Social History   Tobacco Use  . Smoking status: Former Smoker    Last attempt to quit: 01/16/1984    Years since quitting: 33.0  Substance Use Topics  . Alcohol use: Yes    Alcohol/week: 0.6 oz    Types: 1 Glasses of wine per week  . Drug use: No     Allergies   Erythromycin; Ciprofloxacin hcl; Contrast media [iodinated diagnostic agents]; Lidocaine; Penicillins; and Sulfa drugs cross reactors   Review of Systems Review of Systems  Constitutional: Negative for fever.       See HPI  Skin: Negative for rash.  Neurological: Negative for focal weakness.      Physical Exam Triage Vital Signs ED Triage Vitals  Enc Vitals Group     BP 01/07/17 1645 (!) 130/94     Pulse Rate 01/07/17 1645 86     Resp 01/07/17 1645 20     Temp 01/07/17 1645 98.3 F (36.8 C)     Temp Source 01/07/17 1645 Oral     SpO2 01/07/17 1645 96 %     Weight --      Height --      Head Circumference --      Peak Flow --      Pain Score 01/07/17 1640 6     Pain Loc --      Pain Edu? --      Excl. in GC? --    No data found.  Updated Vital Signs BP (!) 130/94 (BP Location: Left Arm)   Pulse 86   Temp 98.3 F (36.8 C) (Oral)   Resp 20   SpO2 96%   Visual Acuity Right Eye Distance:   Left Eye Distance:   Bilateral Distance:    Right Eye Near:   Left Eye Near:    Bilateral Near:     Physical Exam  Constitutional: She is oriented to person, place, and time. She appears well-developed and well-nourished.  Cardiovascular: Normal rate.  Pulmonary/Chest: Effort normal.  Neurological: She is alert and oriented to person, place, and time.  Skin:  2 cm laceration noted on right middle finger. Full ROM.   Nursing note and vitals reviewed.    UC Treatments / Results  Labs (all labs ordered are listed, but only abnormal results are displayed) Labs Reviewed - No data to display  EKG  EKG Interpretation None       Radiology Dg Finger Middle Right  Result Date: 01/07/2017 CLINICAL DATA:  Injury, laceration to right middle finger. EXAM: RIGHT MIDDLE FINGER 2+V COMPARISON:  None. FINDINGS: No acute fracture line or displaced fracture fragment identified. No joint space malalignment. Adjacent soft tissues are unremarkable. IMPRESSION: No acute findings. Electronically Signed   By: Bary RichardStan  Maynard M.D.   On: 01/07/2017 17:22    Procedures Laceration Repair Date/Time: 01/07/2017 5:52 PM Performed by: Lucia EstelleZheng, Obie Silos, NP Authorized by: Mardella LaymanHagler, Brian, MD   Consent:    Consent obtained:  Verbal   Consent given by:  Patient   Risks discussed:  Infection,  need for additional repair and poor wound healing   Alternatives discussed:  No treatment Anesthesia (see MAR for exact dosages):    Anesthesia method:  Local infiltration   Local anesthetic:  Lidocaine 1% w/o epi Laceration details:    Location:  Finger   Finger location:  R long finger  Length (cm):  2 Repair type:    Repair type:  Simple Pre-procedure details:    Preparation:  Patient was prepped and draped in usual sterile fashion Exploration:    Hemostasis achieved with:  Direct pressure   Wound exploration: wound explored through full range of motion   Treatment:    Area cleansed with:  Betadine Skin repair:    Repair method:  Sutures   Suture size:  4-0   Suture material:  Nylon   Suture technique:  Simple interrupted Approximation:    Approximation:  Close   Vermilion border: well-aligned   Post-procedure details:    Dressing:  Sterile dressing   Patient tolerance of procedure:  Tolerated well, no immediate complications   (including critical care time)  Medications Ordered in UC Medications  Tdap (BOOSTRIX) injection 0.5 mL (not administered)     Initial Impression / Assessment and Plan / UC Course  I have reviewed the triage vital signs and the nursing notes.  Pertinent labs & imaging results that were available during my care of the patient were reviewed by me and considered in my medical decision making (see chart for details).   Final Clinical Impressions(s) / UC Diagnoses   Final diagnoses:  Laceration of right middle finger without foreign body without damage to nail, initial encounter   Lacerations successfully repair. Wound care discussed. Return in one week for suture removal.  ED Discharge Orders    None       Controlled Substance Prescriptions [place] Controlled Substance Registry consulted? Not Applicable   Lucia Estelle, NP 01/07/17 1753

## 2017-01-13 ENCOUNTER — Ambulatory Visit (HOSPITAL_COMMUNITY)
Admission: EM | Admit: 2017-01-13 | Discharge: 2017-01-13 | Disposition: A | Discharge status: Home / Self Care | Payer: Medicare Other

## 2017-01-13 DIAGNOSIS — Z4802 Encounter for removal of sutures: Secondary | ICD-10-CM

## 2017-01-13 NOTE — ED Triage Notes (Signed)
4 sutures removed from well healed and approximated right middle finger. No signs of infection. Placed 2 1/8 steri strips to the right side of laceration as mild separation occurred when sutures were removed. Patient given extra steri strips for home.

## 2017-11-15 DIAGNOSIS — M19041 Primary osteoarthritis, right hand: Secondary | ICD-10-CM | POA: Diagnosis not present

## 2018-07-06 ENCOUNTER — Other Ambulatory Visit: Payer: Self-pay | Admitting: Family Medicine

## 2018-07-06 DIAGNOSIS — M1990 Unspecified osteoarthritis, unspecified site: Secondary | ICD-10-CM | POA: Diagnosis not present

## 2018-07-06 DIAGNOSIS — Z0000 Encounter for general adult medical examination without abnormal findings: Secondary | ICD-10-CM | POA: Diagnosis not present

## 2018-07-06 DIAGNOSIS — Z1211 Encounter for screening for malignant neoplasm of colon: Secondary | ICD-10-CM | POA: Diagnosis not present

## 2018-07-06 DIAGNOSIS — J45909 Unspecified asthma, uncomplicated: Secondary | ICD-10-CM | POA: Diagnosis not present

## 2018-07-06 DIAGNOSIS — G459 Transient cerebral ischemic attack, unspecified: Secondary | ICD-10-CM | POA: Diagnosis not present

## 2018-07-06 DIAGNOSIS — I671 Cerebral aneurysm, nonruptured: Secondary | ICD-10-CM | POA: Diagnosis not present

## 2018-07-06 DIAGNOSIS — Z1231 Encounter for screening mammogram for malignant neoplasm of breast: Secondary | ICD-10-CM

## 2018-07-07 DIAGNOSIS — I25119 Atherosclerotic heart disease of native coronary artery with unspecified angina pectoris: Secondary | ICD-10-CM | POA: Diagnosis not present

## 2018-07-07 DIAGNOSIS — G459 Transient cerebral ischemic attack, unspecified: Secondary | ICD-10-CM | POA: Diagnosis not present

## 2018-07-26 DIAGNOSIS — M25432 Effusion, left wrist: Secondary | ICD-10-CM | POA: Diagnosis not present

## 2018-07-26 DIAGNOSIS — T7840XA Allergy, unspecified, initial encounter: Secondary | ICD-10-CM | POA: Diagnosis not present

## 2018-08-09 DIAGNOSIS — H2513 Age-related nuclear cataract, bilateral: Secondary | ICD-10-CM | POA: Diagnosis not present

## 2018-08-09 DIAGNOSIS — M79605 Pain in left leg: Secondary | ICD-10-CM | POA: Diagnosis not present

## 2018-08-09 DIAGNOSIS — M79604 Pain in right leg: Secondary | ICD-10-CM | POA: Diagnosis not present

## 2018-08-09 DIAGNOSIS — G4701 Insomnia due to medical condition: Secondary | ICD-10-CM | POA: Diagnosis not present

## 2018-08-09 DIAGNOSIS — H43311 Vitreous membranes and strands, right eye: Secondary | ICD-10-CM | POA: Diagnosis not present

## 2018-08-30 ENCOUNTER — Ambulatory Visit: Payer: PRIVATE HEALTH INSURANCE

## 2018-08-30 DIAGNOSIS — Z1211 Encounter for screening for malignant neoplasm of colon: Secondary | ICD-10-CM | POA: Diagnosis not present

## 2018-08-30 DIAGNOSIS — R140 Abdominal distension (gaseous): Secondary | ICD-10-CM | POA: Diagnosis not present

## 2018-09-14 DIAGNOSIS — H2513 Age-related nuclear cataract, bilateral: Secondary | ICD-10-CM | POA: Diagnosis not present

## 2018-09-14 DIAGNOSIS — H43311 Vitreous membranes and strands, right eye: Secondary | ICD-10-CM | POA: Diagnosis not present

## 2018-09-29 DIAGNOSIS — M545 Low back pain: Secondary | ICD-10-CM | POA: Diagnosis not present

## 2018-09-29 DIAGNOSIS — M79661 Pain in right lower leg: Secondary | ICD-10-CM | POA: Diagnosis not present

## 2018-09-29 DIAGNOSIS — M79662 Pain in left lower leg: Secondary | ICD-10-CM | POA: Diagnosis not present

## 2018-10-08 DIAGNOSIS — M5416 Radiculopathy, lumbar region: Secondary | ICD-10-CM | POA: Diagnosis not present

## 2018-10-11 DIAGNOSIS — M5416 Radiculopathy, lumbar region: Secondary | ICD-10-CM | POA: Diagnosis not present

## 2018-12-01 ENCOUNTER — Ambulatory Visit (INDEPENDENT_AMBULATORY_CARE_PROVIDER_SITE_OTHER): Payer: Medicare Other | Admitting: Neurology

## 2018-12-01 ENCOUNTER — Other Ambulatory Visit: Payer: Self-pay

## 2018-12-01 DIAGNOSIS — M51369 Other intervertebral disc degeneration, lumbar region without mention of lumbar back pain or lower extremity pain: Secondary | ICD-10-CM

## 2018-12-01 DIAGNOSIS — I739 Peripheral vascular disease, unspecified: Secondary | ICD-10-CM

## 2018-12-01 DIAGNOSIS — M5416 Radiculopathy, lumbar region: Secondary | ICD-10-CM

## 2018-12-01 DIAGNOSIS — M5136 Other intervertebral disc degeneration, lumbar region: Secondary | ICD-10-CM | POA: Diagnosis not present

## 2018-12-01 DIAGNOSIS — Z0289 Encounter for other administrative examinations: Secondary | ICD-10-CM

## 2018-12-01 NOTE — Progress Notes (Signed)
See procedure note.

## 2018-12-04 NOTE — Progress Notes (Signed)
Full Name: Julie Tapia Gender: Female MRN #: [id number] Date of Birth: Nov 22, 1950    Visit Date: 12/01/2018 09:28 Age: 68 Years 5 Months Old Examining Physician: Naomie Dean, MD  Referring Physician: Glynis Smiles, PA  History: Patient is here as at the request of Glynis Smiles, PA for EMG/NCS to evaluate for lumbar radiculopathy. She has chronic low back pain, she has known about a herniated L3 disk since 2008 when she stood up and noticed severe pain but never had surgery; this led to a series of injections( I do not have access to her MRI Lumbar Spine images). The pain improved after injections. She has polyarthritis and has had injections in the knees as well. At the end of May she had to get an injection for an insect injury, within 2-3 days she started waking up with severe cramping in the right leg and calf and the cramping can be so severe her muscle is sore afterwards. She saw Dr. Andrey Campanile and he did not think this was related to the shot. Leg pain is progressive, she has severe spasms in the right distal leg, tingling in the calf and numbness, to the bottom of the foot. She was very active walking 30+ miles a year and has had to stop due to severe back pain and feels her back is "going to break". Also cramping in the left > right inner thighs. Left thigh symptoms are worse than right thigh symptoms. Right distal leg symptoms are worse than on the left.  Gets very tight right calf pain, muscle gets very hard mostly on the right. She saw Dr. Charlann Boxer for her hip in the past and reports full hip replacement on the right side. 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 Her hip surgery was in 2012.  Limited neurologic exam: left patella(1+) is hyporeflex compared to right patellar(2+). Right AJ(absent) is hyporeflexic compared to left AJ(trace). Weakness left hip flexion 5- with more left proximal symptoms. Right leg flexion 4+/5 and she reports more symptoms distally in the right leg.  Clinical history and focused exam may point to left L3/L4 pathology and right L5/S1 but clinical and radiologic correlation is recommended.  Summary: EMG/NCS performed on the bilateral lower extremities. The left superficial peroneal sensory nerve showed reduced amplitude (3uV, N>6). The left peroneal motor nerve showed reduced amplitude(0.63mV, N>2) and decreased conduction velocity(Fib head - Ankle, 19m/s,N>44) and decreased conduction velocity(Pop fossa - Fib head, 11m/s,N>44).     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  EMG needle study did not show acute/ongoing denervation or chronic neurogenic changes to explain her symptoms but clinically appears radicular. Clinical and radiologic correlation recommended.  Naomie Dean, M.D.  [facility] [address] [place], [place] [address] Tel: [phone] Fax: [phone]        [facility]    Nerve / Sites Muscle Latency Ref. Amplitude Ref. Rel Amp Segments Distance Velocity Ref. Area    ms ms mV mV %  cm m/s m/s mVms  R Peroneal - EDB     Ankle EDB 4.8 ?6.5 2.5 ?2.0 100 Ankle - EDB 9   9.4     Fib head EDB 11.5  1.9  76.2 Fib head - Ankle 29 44 ?44 7.2     Pop fossa EDB 13.6  2.3  122 Pop fossa - Fib head 10 47 ?44 7.8         Pop fossa - Ankle      L Peroneal - EDB     Ankle EDB 5.2 ?6.5 0.5 ?2.0 100 Ankle - EDB 9   1.7     Fib head EDB 12.4  0.3  60.7 Fib head - Ankle 29 40 ?44 1.1     Pop fossa EDB 15.1  0.4  127 Pop fossa - Fib head 10 38 ?44 1.3         Pop fossa - Ankle      R Tibial - AH     Ankle AH 4.4 ?5.8 4.8 ?4.0 100 Ankle - AH 9   9.8     Pop fossa AH 13.4  3.5  72.9 Pop fossa - Ankle 40 44 ?41 7.5  L Tibial - AH     Ankle AH 3.8 ?5.8 4.8 ?4.0 100 Ankle - AH 9   9.9     Pop fossa AH 13.3  2.5  52.4 Pop fossa - Ankle 39 41  ?41 4.8             SNC    Nerve / Sites Rec. Site Peak Lat Ref.  Amp Ref. Segments Distance    ms ms V V  cm  R Sural - Ankle (Calf)     Calf Ankle 3.4 ?4.4 9 ?6 Calf - Ankle 14  L Sural - Ankle (Calf)     Calf Ankle 3.9 ?4.4 10 ?6 Calf - Ankle 14  R Superficial peroneal - Ankle     Lat leg Ankle 4.2 ?4.4 6 ?6 Lat leg - Ankle 14  L Superficial peroneal - Ankle     Lat leg Ankle 4.0 ?4.4 3 ?6 Lat leg - Ankle 14              F  Wave    Nerve F Lat Ref.   ms ms  R Tibial - AH 51.7 ?56.0  L Tibial - AH 54.1 ?56.0         EMG full       EMG Summary Table    Spontaneous MUAP Recruitment  Muscle IA Fib PSW Fasc Other Amp Dur. Poly Pattern  B. Iliopsoas Normal None None None _______ Normal Normal Normal Normal  R. Vastus medialis Normal None None None _______ Normal Normal Normal Normal  R. Tibialis anterior Normal None None None _______ Normal Normal Normal Normal  R. Gastrocnemius (Medial head) Normal None None None _______ Normal Normal Normal Normal  R. Extensor hallucis longus Normal None None None _______ Normal Normal Normal Normal  R. Biceps femoris (long head) Normal None None None _______ Normal Normal Normal Normal  R. Gluteus maximus Normal None None None _______ Normal Normal Normal Normal  R. Gluteus medius Normal None None None _______ Normal Normal Normal Normal  R. Lumbar paraspinals (low) Normal None None None _______ Normal Normal Normal Normal  L. Gastrocnemius (Medial head) Normal None None None _______ Normal Normal Normal Normal  L. Vastus medialis Normal None None None _______ Normal Normal Normal Normal  L. Iliopsoas Normal None None None _______ Normal Normal Normal Normal  L. Tibialis anterior Normal None None None _______ Normal Normal Normal Normal  L. Biceps femoris (long head) Normal None None None _______ Normal Normal Normal Normal  L. Lumbar paraspinals (low) Normal None None None _______ Normal Normal Normal Normal

## 2018-12-04 NOTE — Progress Notes (Signed)
See procedure note.

## 2018-12-06 NOTE — Procedures (Signed)
° ° ° °   °Full Name: Julie Tapia Gender: Female °MRN #: [id number] Date of Birth: 12/03/1950 °   °Visit Date: 12/01/2018 09:28 °Age: 68 Years 5 Months Old °Examining Physician: Sheily Lineman, MD  °Referring Physician: Amanda Ward, PA ° °History: Patient is here as at the request of Amanda Ward, PA for EMG/NCS to evaluate for lumbar radiculopathy. She has chronic low back pain, she has known about a herniated L3 disk since 2008 when she stood up and noticed severe pain but never had surgery; this led to a series of injections( I do not have access to her MRI Lumbar Spine images). The pain improved after injections. She has polyarthritis and has had injections in the knees as well. At the end of May she had to get an injection for an insect injury, within 2-3 days she started waking up with severe cramping in the right leg and calf and the cramping can be so severe her muscle is sore afterwards. She saw Dr. Blasco and he did not think this was related to the shot. Leg pain is progressive, she has severe spasms in the right distal leg, tingling in the calf and numbness, to the bottom of the foot. She was very active walking 30+ miles a year and has had to stop due to severe back pain and feels her back is "going to break". Also cramping in the left > right inner thighs. Left thigh symptoms are worse than right thigh symptoms. Right distal leg symptoms are worse than on the left.  Gets very tight right calf pain, muscle gets very hard mostly on the right. She saw Dr. Olin for her hip in the past and reports full hip replacement on the right side. 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 Her hip surgery was in 2012.  Limited neurologic exam: left patella(1+) is hyporeflex compared to right patellar(2+). Right AJ(absent) is hyporeflexic compared to left AJ(trace). Weakness left hip flexion 5- with more left proximal symptoms. Right leg flexion 4+/5 and she reports more symptoms distally in the right leg.  Clinical history and focused exam may point to left L3/L4 pathology and right L5/S1 but clinical and radiologic correlation is recommended. ° °Summary: EMG/NCS performed on the bilateral lower extremities. The left superficial peroneal sensory nerve showed reduced amplitude (3uV, N>6). The left peroneal motor nerve showed reduced amplitude(0.5mV, N>2) and decreased conduction velocity(Fib head - Ankle, 40m/s,N>44) and decreased conduction velocity(Pop fossa - Fib head, 38m/s,N>44).  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  EMG needle study did not show acute/ongoing denervation or chronic neurogenic changes to explain her symptoms but clinically appears radicular. Clinical and radiologic correlation recommended. ° °Kito Cuffe, M.D. ° °[facility] °[address] °[place], [place] [address] °Tel: [phone] °Fax: [phone] ° °   °   °[facility] °   °Nerve / Sites Muscle Latency Ref. Amplitude Ref. Rel Amp Segments Distance Velocity Ref. Area  °  ms ms mV mV %  cm m/s m/s mVms  °R Peroneal - EDB  °   Ankle EDB 4.8 ?6.5 2.5 ?2.0 100 Ankle - EDB 9   9.4  °   Fib head EDB 11.5  1.9  76.2 Fib head - Ankle 29 44 ?44 7.2  °   Pop fossa EDB 13.6  2.3    122 Pop fossa - Fib head 10 47 ?44 7.8         Pop fossa - Ankle      L Peroneal - EDB     Ankle EDB 5.2 ?6.5 0.5 ?2.0 100 Ankle - EDB 9   1.7     Fib head EDB 12.4  0.3  60.7 Fib head - Ankle 29 40 ?44 1.1     Pop fossa EDB 15.1  0.4  127 Pop fossa - Fib head 10 38 ?44 1.3         Pop fossa - Ankle      R Tibial - AH     Ankle AH 4.4 ?5.8 4.8 ?4.0 100 Ankle - AH 9   9.8     Pop fossa AH 13.4  3.5  72.9 Pop fossa - Ankle 40 44 ?41 7.5  L Tibial - AH     Ankle AH 3.8 ?5.8 4.8 ?4.0 100 Ankle - AH 9   9.9     Pop fossa AH 13.3  2.5  52.4 Pop fossa - Ankle 39 41  ?41 4.8             SNC    Nerve / Sites Rec. Site Peak Lat Ref.  Amp Ref. Segments Distance    ms ms V V  cm  R Sural - Ankle (Calf)     Calf Ankle 3.4 ?4.4 9 ?6 Calf - Ankle 14  L Sural - Ankle (Calf)     Calf Ankle 3.9 ?4.4 10 ?6 Calf - Ankle 14  R Superficial peroneal - Ankle     Lat leg Ankle 4.2 ?4.4 6 ?6 Lat leg - Ankle 14  L Superficial peroneal - Ankle     Lat leg Ankle 4.0 ?4.4 3 ?6 Lat leg - Ankle 14              F  Wave    Nerve F Lat Ref.   ms ms  R Tibial - AH 51.7 ?56.0  L Tibial - AH 54.1 ?56.0         EMG full       EMG Summary Table    Spontaneous MUAP Recruitment  Muscle IA Fib PSW Fasc Other Amp Dur. Poly Pattern  B. Iliopsoas Normal None None None _______ Normal Normal Normal Normal  R. Vastus medialis Normal None None None _______ Normal Normal Normal Normal  R. Tibialis anterior Normal None None None _______ Normal Normal Normal Normal  R. Gastrocnemius (Medial head) Normal None None None _______ Normal Normal Normal Normal  R. Extensor hallucis longus Normal None None None _______ Normal Normal Normal Normal  R. Biceps femoris (long head) Normal None None None _______ Normal Normal Normal Normal  R. Gluteus maximus Normal None None None _______ Normal Normal Normal Normal  R. Gluteus medius Normal None None None _______ Normal Normal Normal Normal  R. Lumbar paraspinals (low) Normal None None None _______ Normal Normal Normal Normal  L. Gastrocnemius (Medial head) Normal None None None _______ Normal Normal Normal Normal  L. Vastus medialis Normal None None None _______ Normal Normal Normal Normal  L. Iliopsoas Normal None None None _______ Normal Normal Normal Normal  L. Tibialis anterior Normal None None None _______ Normal Normal Normal Normal  L. Biceps femoris (long head) Normal None None None _______ Normal Normal Normal Normal  L. Lumbar paraspinals (low) Normal None None None _______ Normal Normal Normal Normal

## 2018-12-08 ENCOUNTER — Other Ambulatory Visit: Payer: Self-pay

## 2018-12-08 ENCOUNTER — Ambulatory Visit (HOSPITAL_COMMUNITY)
Admission: RE | Admit: 2018-12-08 | Discharge: 2018-12-08 | Disposition: A | Discharge status: Home / Self Care | Payer: Medicare Other | Source: Ambulatory Visit | Attending: Vascular Surgery | Admitting: Vascular Surgery

## 2018-12-08 ENCOUNTER — Encounter: Payer: Self-pay | Admitting: Vascular Surgery

## 2018-12-08 ENCOUNTER — Ambulatory Visit (INDEPENDENT_AMBULATORY_CARE_PROVIDER_SITE_OTHER): Payer: Medicare Other | Admitting: Vascular Surgery

## 2018-12-08 VITALS — BP 108/68 | HR 62 | Temp 97.7°F | Resp 18 | Ht 68.0 in | Wt 147.0 lb

## 2018-12-08 DIAGNOSIS — M25561 Pain in right knee: Secondary | ICD-10-CM

## 2018-12-08 DIAGNOSIS — M25562 Pain in left knee: Secondary | ICD-10-CM | POA: Diagnosis not present

## 2018-12-08 DIAGNOSIS — I739 Peripheral vascular disease, unspecified: Secondary | ICD-10-CM | POA: Insufficient documentation

## 2018-12-08 NOTE — Progress Notes (Signed)
Referring Physician: Dr Venita Lickahari Brooks  Patient name: Julie Tapia MRN: [id number] DOB: May 09, 1950 Sex: female  REASON FOR CONSULT: Lower extremity pain  HPI: Julie Tapia is a 68 y.o. female, currently undergoing evaluation for back pain and leg pain.  She has previously had an MRI scan which shows multilevel disc disease.  She was sent here for further evaluation for possible vascular etiology of her pain.  She is a former smoker but never smoked heavily and quit 35 years ago.  She states that her right leg is slightly worse than her left leg.  She frequently gets cramps in her legs while asleep.  These have improved over the last few months.  She states her main symptoms include weakness in her legs when she first gets out of bed in the morning.  She is able to walk 2 to 6 miles per day.  At the end of the day her back aches in her legs are weaker.  However she does not really describe classic claudication symptoms of pain that occurs with exercise is relieved by rest and then recurs at similar walking distance.  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  Asthma both of these are currently stable.  She has multi-joint arthritis.  Past Medical History:  Diagnosis Date  . 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 Arthritis 01-16-11   osteoarthritis,-knees, hips, ankles. 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 Asthma 01-16-11   chronic asthmatic-child yrs, no severe issues in adult yrs", occ. tight chested-"no inhalers in yrs.  . Complication of anesthesia 01-16-11   Pt. very slow to awaken and was days before feeling like herself.   Past Surgical History:  Procedure Laterality Date  . ABDOMINAL HYSTERECTOMY    . BREAST CYST EXCISION  01-16-11   several excisions in the past  . JOINT REPLACEMENT  01-16-11   Rt.  great toe ball joint replacement-metal  . REDUCTION MAMMAPLASTY  01-16-11   Bil. '86( problems with s/p surgical clot at rt. breast drainage site)  . TOTAL HIP ARTHROPLASTY  01/20/2011   Procedure: TOTAL HIP ARTHROPLASTY ANTERIOR APPROACH;  Surgeon: Shelda PalMatthew D Olin;  Location: [facility];  Service: Orthopedics;  Laterality: Right;    No family history on file.  SOCIAL HISTORY: Social History   Socioeconomic History  . Marital status: Single    Spouse name: Not on file  . Number of children: Not on file  . Years of education: Not on file  . Highest education level: Not on file  Occupational History  . Not on file  Social Needs  . Financial resource strain: Not on file  . Food insecurity    Worry: Not on file    Inability: Not on file  . Transportation needs    Medical: Not on file    Non-medical: Not on file  Tobacco Use  . Smoking status: Former Smoker    Quit date: 01/16/1984    Years since quitting: 34.9  . Smokeless tobacco: Never Used  Substance and Sexual Activity  . Alcohol use: Yes    Alcohol/week: 1.0 standard drinks    Types: 1 Glasses of wine per week  . Drug use: No  . Sexual activity: Never  Lifestyle  . Physical activity    Days per week: Not on file  Minutes per session: Not on file  . Stress: Not on file  Relationships  . Social Herbalist on phone: Not on file    Gets together: Not on file    Attends religious service: Not on file    Active member of club or organization: Not on file    Attends meetings of clubs or organizations: Not on file    Relationship status: Not on file  . Intimate partner violence    Fear of current or ex partner: Not on file    Emotionally abused: Not on file    Physically abused: Not on file    Forced sexual activity: Not on file  Other Topics Concern  . Not on file  Social History Narrative  . Not on file    Allergies  Allergen Reactions  . Erythromycin Anaphylaxis    All mycins  . Ciprofloxacin Hcl  Itching and Swelling  . Contrast Media [Iodinated Diagnostic Agents] Itching and Swelling  . Lidocaine Itching and Other (See Comments)    Redness   . Penicillins Other (See Comments)    Felt like had Chickenpox 01-16-11" Keflex is antibiotic of choice" per pt.-[place]  . Sulfa Drugs Cross Reactors Itching, Swelling and Other (See Comments)    All sulfa drugs    Current Outpatient Medications  Medication Sig Dispense Refill  . albuterol (VENTOLIN HFA) 108 (90 Base) MCG/ACT inhaler albuterol sulfate HFA 90 mcg/actuation aerosol inhaler  INL 2 PFS PO Q 6 H PRN FOR WHZ    . aspirin 325 MG tablet Take 325 mg daily by mouth.    . cholecalciferol (VITAMIN D) 1000 UNITS tablet Take 1,000 Units by mouth daily.      . diphenhydrAMINE (BENADRYL) 25 mg capsule Take 25 mg by mouth every 6 (six) hours as needed. Allergies      . EPINEPHrine (EPIPEN JR) 0.15 MG/0.3ML injection Inject 0.15 mg into the muscle as needed. Allergy reaction     . Homeopathic Products (ARNICARE) GEL Apply 1 application topically 4 (four) times daily as needed. pain     . Multiple Vitamins-Minerals (MULTIVITAMINS THER. W/MINERALS) TABS Take 1 tablet by mouth daily.       No current facility-administered medications for this visit.     ROS:   General:  No weight loss, Fever, chills  HEENT: No recent headaches, no nasal bleeding, no visual changes, no sore throat  Neurologic: No dizziness, blackouts, seizures. No recent symptoms of stroke or mini- stroke. No recent episodes of slurred speech, or temporary blindness.  Cardiac: No recent episodes of chest pain/pressure, no shortness of breath at rest.  No shortness of breath with exertion.  Denies history of atrial fibrillation or irregular heartbeat  Vascular: No history of rest pain in feet.  No history of claudication.  No history of non-healing ulcer, No history of DVT   Pulmonary: No home oxygen, no productive cough, no hemoptysis,  No asthma or wheezing   Musculoskeletal:  [X]  Arthritis, [X]  Low back pain,  [X]  Joint pain  Hematologic:No history of hypercoagulable state.  No history of easy bleeding.  No history of anemia  Gastrointestinal: No hematochezia or melena,  No gastroesophageal reflux, no trouble swallowing  Urinary: [ ]  chronic Kidney disease, [ ]  on HD - [ ]  MWF or [ ]  TTHS, [ ]  Burning with urination, [ ]  Frequent urination, [ ]  Difficulty urinating;   Skin: No rashes  Psychological: No history of anxiety,  No history of depression  Physical Examination  Vitals:   12/08/18 0931  BP: 108/68  Pulse: 62  Resp: 18  Temp: 97.7 F (36.5 C)  SpO2: 100%  Weight: 147 lb (66.7 kg)  Height: 5\' 8"  (1.727 m)    Body mass index is 22.35 kg/m.  General:  Alert and oriented, no acute distress HEENT: Normal Neck: No JVD Cardiac: Regular Rate and Rhythm  Abdomen: Soft, non-tender, non-distended, no mass Skin: No rash Extremity Pulses:  2+ radial, brachial, femoral, 1+ dorsalis pedis, 3+ posterior tibial pulses bilaterally Musculoskeletal: No deformity or edema  Neurologic: Upper and lower extremity motor 5/5 and symmetric  DATA:  Patient had bilateral ABIs performed today which were greater than 1 triphasic bilaterally, normal  ASSESSMENT: Bilateral lower extremity weakness with normal noninvasive arterial scan and clinical arterial exam   PLAN: Patient will follow-up with on an as-needed basis.   [place], MD [facility] Office: [phone] Pager: [phone]

## 2018-12-14 DIAGNOSIS — M5126 Other intervertebral disc displacement, lumbar region: Secondary | ICD-10-CM | POA: Diagnosis not present

## 2018-12-14 DIAGNOSIS — M4126 Other idiopathic scoliosis, lumbar region: Secondary | ICD-10-CM | POA: Diagnosis not present

## 2018-12-14 DIAGNOSIS — R030 Elevated blood-pressure reading, without diagnosis of hypertension: Secondary | ICD-10-CM | POA: Diagnosis not present

## 2018-12-14 DIAGNOSIS — M545 Low back pain: Secondary | ICD-10-CM | POA: Diagnosis not present

## 2018-12-14 DIAGNOSIS — M5416 Radiculopathy, lumbar region: Secondary | ICD-10-CM | POA: Diagnosis not present

## 2019-01-04 DIAGNOSIS — M5416 Radiculopathy, lumbar region: Secondary | ICD-10-CM | POA: Diagnosis not present

## 2019-01-20 DIAGNOSIS — M5416 Radiculopathy, lumbar region: Secondary | ICD-10-CM | POA: Diagnosis not present

## 2019-02-03 DIAGNOSIS — M5416 Radiculopathy, lumbar region: Secondary | ICD-10-CM | POA: Diagnosis not present

## 2019-02-10 DIAGNOSIS — M5416 Radiculopathy, lumbar region: Secondary | ICD-10-CM | POA: Diagnosis not present

## 2019-02-13 ENCOUNTER — Other Ambulatory Visit: Payer: Self-pay

## 2019-02-13 ENCOUNTER — Other Ambulatory Visit (HOSPITAL_COMMUNITY): Payer: Self-pay | Admitting: Sports Medicine

## 2019-02-13 ENCOUNTER — Other Ambulatory Visit (HOSPITAL_COMMUNITY): Payer: Self-pay | Admitting: Physician Assistant

## 2019-02-13 ENCOUNTER — Ambulatory Visit (HOSPITAL_COMMUNITY)
Admission: RE | Admit: 2019-02-13 | Discharge: 2019-02-13 | Disposition: A | Discharge status: Home / Self Care | Payer: Medicare Other | Source: Ambulatory Visit | Attending: Cardiovascular Disease | Admitting: Cardiovascular Disease

## 2019-02-13 DIAGNOSIS — M7989 Other specified soft tissue disorders: Secondary | ICD-10-CM | POA: Diagnosis not present

## 2019-02-13 DIAGNOSIS — M5416 Radiculopathy, lumbar region: Secondary | ICD-10-CM | POA: Diagnosis not present

## 2019-02-13 DIAGNOSIS — M79605 Pain in left leg: Secondary | ICD-10-CM | POA: Diagnosis not present

## 2019-02-13 DIAGNOSIS — M79661 Pain in right lower leg: Secondary | ICD-10-CM | POA: Insufficient documentation

## 2019-02-13 DIAGNOSIS — M79662 Pain in left lower leg: Secondary | ICD-10-CM | POA: Diagnosis not present

## 2019-02-13 DIAGNOSIS — M79604 Pain in right leg: Secondary | ICD-10-CM | POA: Diagnosis not present

## 2019-03-07 DIAGNOSIS — M5416 Radiculopathy, lumbar region: Secondary | ICD-10-CM | POA: Diagnosis not present

## 2019-03-15 DIAGNOSIS — M5416 Radiculopathy, lumbar region: Secondary | ICD-10-CM | POA: Diagnosis not present

## 2019-03-27 DIAGNOSIS — M5416 Radiculopathy, lumbar region: Secondary | ICD-10-CM | POA: Diagnosis not present

## 2019-04-12 DIAGNOSIS — M5416 Radiculopathy, lumbar region: Secondary | ICD-10-CM | POA: Diagnosis not present

## 2019-05-11 DIAGNOSIS — Z23 Encounter for immunization: Secondary | ICD-10-CM | POA: Diagnosis not present

## 2019-07-18 DIAGNOSIS — Z0000 Encounter for general adult medical examination without abnormal findings: Secondary | ICD-10-CM | POA: Diagnosis not present

## 2019-07-18 DIAGNOSIS — M5136 Other intervertebral disc degeneration, lumbar region: Secondary | ICD-10-CM | POA: Diagnosis not present

## 2019-07-18 DIAGNOSIS — L239 Allergic contact dermatitis, unspecified cause: Secondary | ICD-10-CM | POA: Diagnosis not present

## 2019-07-18 DIAGNOSIS — M48061 Spinal stenosis, lumbar region without neurogenic claudication: Secondary | ICD-10-CM | POA: Diagnosis not present

## 2019-07-18 DIAGNOSIS — J45909 Unspecified asthma, uncomplicated: Secondary | ICD-10-CM | POA: Diagnosis not present

## 2019-07-19 ENCOUNTER — Other Ambulatory Visit: Payer: Self-pay | Admitting: Family Medicine

## 2019-07-19 DIAGNOSIS — Z1231 Encounter for screening mammogram for malignant neoplasm of breast: Secondary | ICD-10-CM

## 2019-07-19 DIAGNOSIS — E2839 Other primary ovarian failure: Secondary | ICD-10-CM

## 2019-07-24 DIAGNOSIS — H43311 Vitreous membranes and strands, right eye: Secondary | ICD-10-CM | POA: Diagnosis not present

## 2019-07-24 DIAGNOSIS — H2513 Age-related nuclear cataract, bilateral: Secondary | ICD-10-CM | POA: Diagnosis not present

## 2019-10-02 ENCOUNTER — Other Ambulatory Visit: Payer: Self-pay

## 2019-10-02 ENCOUNTER — Ambulatory Visit
Admission: RE | Admit: 2019-10-02 | Discharge: 2019-10-02 | Disposition: A | Discharge status: Home / Self Care | Payer: Medicare Other | Source: Ambulatory Visit | Attending: Family Medicine | Admitting: Family Medicine

## 2019-10-02 DIAGNOSIS — Z1231 Encounter for screening mammogram for malignant neoplasm of breast: Secondary | ICD-10-CM

## 2019-10-02 DIAGNOSIS — E2839 Other primary ovarian failure: Secondary | ICD-10-CM

## 2019-10-05 ENCOUNTER — Other Ambulatory Visit: Payer: Self-pay | Admitting: Family Medicine

## 2019-10-05 DIAGNOSIS — R928 Other abnormal and inconclusive findings on diagnostic imaging of breast: Secondary | ICD-10-CM

## 2019-10-17 ENCOUNTER — Other Ambulatory Visit: Payer: Self-pay

## 2019-10-17 ENCOUNTER — Ambulatory Visit
Admission: RE | Admit: 2019-10-17 | Discharge: 2019-10-17 | Disposition: A | Discharge status: Home / Self Care | Payer: Medicare Other | Source: Ambulatory Visit | Attending: Family Medicine | Admitting: Family Medicine

## 2019-10-17 ENCOUNTER — Other Ambulatory Visit: Payer: Self-pay | Admitting: Family Medicine

## 2019-10-17 DIAGNOSIS — R928 Other abnormal and inconclusive findings on diagnostic imaging of breast: Secondary | ICD-10-CM

## 2019-10-17 DIAGNOSIS — N6489 Other specified disorders of breast: Secondary | ICD-10-CM | POA: Diagnosis not present

## 2019-10-17 DIAGNOSIS — R922 Inconclusive mammogram: Secondary | ICD-10-CM | POA: Diagnosis not present

## 2019-11-15 DIAGNOSIS — Q211 Atrial septal defect: Secondary | ICD-10-CM | POA: Diagnosis not present

## 2019-11-15 DIAGNOSIS — Z7982 Long term (current) use of aspirin: Secondary | ICD-10-CM | POA: Diagnosis not present

## 2019-11-15 DIAGNOSIS — Z87891 Personal history of nicotine dependence: Secondary | ICD-10-CM | POA: Diagnosis not present

## 2019-11-15 DIAGNOSIS — I671 Cerebral aneurysm, nonruptured: Secondary | ICD-10-CM | POA: Diagnosis not present

## 2020-01-10 DIAGNOSIS — Z23 Encounter for immunization: Secondary | ICD-10-CM | POA: Diagnosis not present

## 2020-01-14 ENCOUNTER — Ambulatory Visit: Payer: Medicare Other | Attending: Internal Medicine

## 2020-01-14 DIAGNOSIS — Z23 Encounter for immunization: Secondary | ICD-10-CM

## 2020-01-14 NOTE — Progress Notes (Signed)
   Covid-19 Vaccination Clinic  Name:  Julie Tapia    MRN: [id number] DOB: [birth year]  01/14/2020  [name] was observed post Covid-19 immunization for 30 minutes based on pre-vaccination screening without incident. She was provided with Vaccine Information Sheet and instruction to access the V-Safe system.   [name] was instructed to call 911 with any severe reactions post vaccine: Marland Kitchen Difficulty breathing  . Swelling of face and throat  . A fast heartbeat  . A bad rash all over body  . Dizziness and weakness   Immunizations Administered    Name Date Dose VIS Date Route   JANSSEN COVID-19 VACCINE 01/14/2020 11:23 AM 0.5 mL 12/20/2019 Intramuscular   Manufacturer: [employer]   Lot: [id number]   NDC: 21194-174-08

## 2020-03-31 ENCOUNTER — Other Ambulatory Visit: Payer: Self-pay

## 2020-03-31 ENCOUNTER — Ambulatory Visit (HOSPITAL_COMMUNITY)
Admission: EM | Admit: 2020-03-31 | Discharge: 2020-03-31 | Disposition: A | Discharge status: Home / Self Care | Payer: Medicare Other | Attending: Emergency Medicine | Admitting: Emergency Medicine

## 2020-03-31 ENCOUNTER — Encounter (HOSPITAL_COMMUNITY): Payer: Self-pay

## 2020-03-31 DIAGNOSIS — R310 Gross hematuria: Secondary | ICD-10-CM | POA: Diagnosis not present

## 2020-03-31 DIAGNOSIS — R300 Dysuria: Secondary | ICD-10-CM | POA: Insufficient documentation

## 2020-03-31 MED ORDER — CEPHALEXIN 500 MG PO CAPS: 500.0000 mg | ORAL_CAPSULE | Freq: Two times a day (BID) | ORAL | 0 refills | Status: AC

## 2020-03-31 NOTE — ED Provider Notes (Signed)
[facility]    CSN: [id number] Arrival date & time: 03/31/20  1605      History   Chief Complaint Chief Complaint  Patient presents with  . Urinary Tract Infection    HPI Julie Tapia is a 70 y.o. female.   Patient presents with dysuria, hematuria, frequency, urgency since this afternoon.  She reports gross hematuria.  She denies fever, chills, abdominal pain, back pain, vaginal discharge, pelvic pain, or other symptoms.  No treatments attempted at home.  Her medical history includes asthma, arthritis, aneurysm.  The history is provided by the patient and medical records.    Past Medical History:  Diagnosis Date  . 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 Arthritis 01-16-11   osteoarthritis,-knees, hips, ankles. 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 Asthma 01-16-11   chronic asthmatic-child yrs, no severe issues in adult yrs", occ. tight chested-"no inhalers in yrs.  . Complication of anesthesia 01-16-11   Pt. very slow to awaken and was days before feeling like herself.    There are no problems to display for this patient.   Past Surgical History:  Procedure Laterality Date  . ABDOMINAL HYSTERECTOMY    . BREAST CYST EXCISION  01-16-11   several excisions in the past  . JOINT REPLACEMENT  01-16-11   Rt. great toe ball joint replacement-metal  . REDUCTION MAMMAPLASTY  01-16-11   Bil. '86( problems with s/p surgical clot at rt. breast drainage site)  . TOTAL HIP ARTHROPLASTY  01/20/2011   Procedure: TOTAL HIP ARTHROPLASTY ANTERIOR APPROACH;  Surgeon: Shelda Pal;  Location: [facility];  Service: Orthopedics;  Laterality: Right;    OB History   No obstetric history on file.      Home Medications    Prior to Admission medications   Medication Sig Start Date End Date Taking? Authorizing Provider  cephALEXin (KEFLEX) 500 MG capsule Take 1 capsule  (500 mg total) by mouth 2 (two) times daily for 5 days. 03/31/20 04/05/20 Yes Mickie Bail, NP  albuterol (VENTOLIN HFA) 108 (90 Base) MCG/ACT inhaler albuterol sulfate HFA 90 mcg/actuation aerosol inhaler  INL 2 PFS PO Q 6 H PRN FOR [facility] 07/06/18   [provider]  aspirin 325 MG tablet Take 325 mg daily by mouth.    [provider]  cholecalciferol (VITAMIN D) 1000 UNITS tablet Take 1,000 Units by mouth daily.      [provider]  diphenhydrAMINE (BENADRYL) 25 mg capsule Take 25 mg by mouth every 6 (six) hours as needed. Allergies      [provider]  EPINEPHrine (EPIPEN JR) 0.15 MG/0.3ML injection Inject 0.15 mg into the muscle as needed. Allergy reaction     [provider]  Homeopathic Products (ARNICARE) GEL Apply 1 application topically 4 (four) times daily as needed. pain     [provider]  Multiple Vitamins-Minerals (MULTIVITAMINS THER. W/MINERALS) TABS Take 1 tablet by mouth daily.      [provider]    Family History History reviewed. No pertinent family history.  Social History Social History   Tobacco Use  . Smoking status: Former Smoker    Quit date: 01/16/1984    Years since quitting: 36.2  . Smokeless tobacco: Never Used  Vaping Use  . Vaping Use: Never used  Substance Use Topics  . Alcohol use: Yes  Alcohol/week: 1.0 standard drink    Types: 1 Glasses of wine per week  . Drug use: No     Allergies   Erythromycin, Ciprofloxacin hcl, Contrast media [iodinated diagnostic agents], Lidocaine, Penicillins, and Sulfa drugs cross reactors   Review of Systems Review of Systems  Constitutional: Negative for chills and fever.  HENT: Negative for ear pain and sore throat.   Eyes: Negative for pain and visual disturbance.  Respiratory: Negative for cough and shortness of breath.   Cardiovascular: Negative for chest pain and palpitations.  Gastrointestinal: Negative for abdominal pain and vomiting.   Genitourinary: Positive for dysuria, frequency, hematuria and urgency. Negative for flank pain, pelvic pain and vaginal discharge.  Musculoskeletal: Negative for arthralgias and back pain.  Skin: Negative for color change and rash.  Neurological: Negative for seizures and syncope.  All other systems reviewed and are negative.    Physical Exam Triage Vital Signs ED Triage Vitals  Enc Vitals Group     BP 03/31/20 1725 (!) 152/84     Pulse Rate 03/31/20 1725 92     Resp 03/31/20 1725 16     Temp 03/31/20 1725 98.2 F (36.8 C)     Temp Source 03/31/20 1725 Oral     SpO2 03/31/20 1725 99 %     Weight --      Height --      Head Circumference --      Peak Flow --      Pain Score 03/31/20 1723 10     Pain Loc --      Pain Edu? --      Excl. in GC? --    No data found.  Updated Vital Signs BP (!) 152/84 (BP Location: Right Arm)   Pulse 92   Temp 98.2 F (36.8 C) (Oral)   Resp 16   SpO2 99%   Visual Acuity Right Eye Distance:   Left Eye Distance:   Bilateral Distance:    Right Eye Near:   Left Eye Near:    Bilateral Near:     Physical Exam Vitals and nursing note reviewed.  Constitutional:      General: She is not in acute distress.    Appearance: She is well-developed and well-nourished. She is not ill-appearing.  HENT:     Head: Normocephalic and atraumatic.     Mouth/Throat:     Mouth: Mucous membranes are moist.  Eyes:     Conjunctiva/sclera: Conjunctivae normal.  Cardiovascular:     Rate and Rhythm: Normal rate and regular rhythm.     Heart sounds: Normal heart sounds.  Pulmonary:     Effort: Pulmonary effort is normal. No respiratory distress.     Breath sounds: Normal breath sounds.  Abdominal:     Palpations: Abdomen is soft.     Tenderness: There is no abdominal tenderness. There is no right CVA tenderness, left CVA tenderness, guarding or rebound.  Musculoskeletal:        General: No edema.     Cervical back: Neck supple.  Skin:    General:  Skin is warm and dry.  Neurological:     General: No focal deficit present.     Mental Status: She is alert and oriented to person, place, and time.     Gait: Gait normal.  Psychiatric:        Mood and Affect: Mood and affect and mood normal.        Behavior: Behavior normal.  UC Treatments / Results  Labs (all labs ordered are listed, but only abnormal results are displayed) Labs Reviewed  URINE CULTURE    EKG   Radiology No results found.  Procedures Procedures (including critical care time)  Medications Ordered in UC Medications - No data to display  Initial Impression / Assessment and Plan / UC Course  I have reviewed the triage vital signs and the nursing notes.  Pertinent labs & imaging results that were available during my care of the patient were reviewed by me and considered in my medical decision making (see chart for details).   Gross hematuria, dysuria.  Lab unable to complete urinalysis due to gross hematuria.  Urine culture pending.  Treating with Keflex.  Instructed patient to follow-up with her PCP tomorrow.  ED precautions discussed.  She agrees to plan of care.   Final Clinical Impressions(s) / UC Diagnoses   Final diagnoses:  Gross hematuria  Dysuria     Discharge Instructions     Take the antibiotic as directed.  The urine culture is pending.  We will call you if it shows the need to change or discontinue your antibiotic.    Follow up with your primary care provider tomorrow.        ED Prescriptions    Medication Sig Dispense Auth. Provider   cephALEXin (KEFLEX) 500 MG capsule Take 1 capsule (500 mg total) by mouth 2 (two) times daily for 5 days. 10 capsule Mickie Bail, NP     PDMP not reviewed this encounter.   Mickie Bail, NP 03/31/20 [phone]

## 2020-03-31 NOTE — Discharge Instructions (Signed)
Take the antibiotic as directed.  The urine culture is pending.  We will call you if it shows the need to change or discontinue your antibiotic.    Follow up with your primary care provider tomorrow.

## 2020-03-31 NOTE — ED Triage Notes (Signed)
Pt present constantly pressure on her bladder and urinating blood. Symptoms started today.  Pt is having urgency and frequency to go to the bathroom

## 2020-04-01 DIAGNOSIS — N3001 Acute cystitis with hematuria: Secondary | ICD-10-CM | POA: Diagnosis not present

## 2020-04-03 LAB — URINE CULTURE: Culture: >=100000 — AB

## 2020-04-19 ENCOUNTER — Ambulatory Visit
Admission: RE | Admit: 2020-04-19 | Discharge: 2020-04-19 | Disposition: A | Discharge status: Home / Self Care | Payer: Medicare Other | Source: Ambulatory Visit | Attending: Family Medicine | Admitting: Family Medicine

## 2020-04-19 ENCOUNTER — Ambulatory Visit: Admission: RE | Admit: 2020-04-19 | Payer: PRIVATE HEALTH INSURANCE | Source: Ambulatory Visit

## 2020-04-19 ENCOUNTER — Other Ambulatory Visit: Payer: Self-pay | Admitting: Family Medicine

## 2020-04-19 ENCOUNTER — Other Ambulatory Visit: Payer: Self-pay

## 2020-04-19 DIAGNOSIS — R928 Other abnormal and inconclusive findings on diagnostic imaging of breast: Secondary | ICD-10-CM

## 2020-04-19 DIAGNOSIS — R922 Inconclusive mammogram: Secondary | ICD-10-CM | POA: Diagnosis not present

## 2020-04-30 ENCOUNTER — Other Ambulatory Visit: Payer: Self-pay | Admitting: Family Medicine

## 2020-04-30 DIAGNOSIS — R928 Other abnormal and inconclusive findings on diagnostic imaging of breast: Secondary | ICD-10-CM

## 2020-05-27 DIAGNOSIS — H2513 Age-related nuclear cataract, bilateral: Secondary | ICD-10-CM | POA: Diagnosis not present

## 2020-05-27 DIAGNOSIS — H1132 Conjunctival hemorrhage, left eye: Secondary | ICD-10-CM | POA: Diagnosis not present

## 2020-10-18 ENCOUNTER — Other Ambulatory Visit: Payer: Self-pay

## 2020-10-18 ENCOUNTER — Ambulatory Visit
Admission: RE | Admit: 2020-10-18 | Discharge: 2020-10-18 | Disposition: A | Discharge status: Home / Self Care | Payer: Medicare Other | Source: Ambulatory Visit | Attending: Family Medicine | Admitting: Family Medicine

## 2020-10-18 ENCOUNTER — Ambulatory Visit: Payer: Medicare Other

## 2020-10-18 DIAGNOSIS — R928 Other abnormal and inconclusive findings on diagnostic imaging of breast: Secondary | ICD-10-CM

## 2020-10-18 DIAGNOSIS — R922 Inconclusive mammogram: Secondary | ICD-10-CM | POA: Diagnosis not present

## 2020-11-07 DIAGNOSIS — T7840XA Allergy, unspecified, initial encounter: Secondary | ICD-10-CM | POA: Diagnosis not present

## 2020-11-07 DIAGNOSIS — M1990 Unspecified osteoarthritis, unspecified site: Secondary | ICD-10-CM | POA: Diagnosis not present

## 2020-11-07 DIAGNOSIS — I671 Cerebral aneurysm, nonruptured: Secondary | ICD-10-CM | POA: Diagnosis not present

## 2020-11-07 DIAGNOSIS — M48061 Spinal stenosis, lumbar region without neurogenic claudication: Secondary | ICD-10-CM | POA: Diagnosis not present

## 2020-11-07 DIAGNOSIS — J329 Chronic sinusitis, unspecified: Secondary | ICD-10-CM | POA: Diagnosis not present

## 2020-11-07 DIAGNOSIS — M5126 Other intervertebral disc displacement, lumbar region: Secondary | ICD-10-CM | POA: Diagnosis not present

## 2020-12-18 DIAGNOSIS — H25013 Cortical age-related cataract, bilateral: Secondary | ICD-10-CM | POA: Diagnosis not present

## 2020-12-18 DIAGNOSIS — H2513 Age-related nuclear cataract, bilateral: Secondary | ICD-10-CM | POA: Diagnosis not present

## 2021-01-03 DIAGNOSIS — Z23 Encounter for immunization: Secondary | ICD-10-CM | POA: Diagnosis not present

## 2021-01-12 DIAGNOSIS — Q2112 Patent foramen ovale: Secondary | ICD-10-CM | POA: Insufficient documentation

## 2021-01-12 DIAGNOSIS — I671 Cerebral aneurysm, nonruptured: Secondary | ICD-10-CM | POA: Insufficient documentation

## 2021-01-12 NOTE — Progress Notes (Signed)
Cardiology Office Note   Date:  01/13/2021   ID:  Julie Tapia, DOB Jan 26, 1951, MRN [id number]  PCP:  Barbie Banner, MD  Cardiologist:   Rollene Rotunda, MD Referring:  Alysia Penna, MD  Chief Complaint  Patient presents with   PFO       History of Present Illness: Julie Tapia is a 70 y.o. female who presents for evaluation of a PFO .  I do see an echocardiogram from [facility] in February 2012 that suggests a possible PFO although it was absolutely clear.  She has had a history of TIAs some years ago and has been treated with full dose aspirin without any problems over many years.  She was seen at [facility] [address] and I reviewed these records for this visit.  She was scheduled for follow CTA in 2026.  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  She actually does quite well.  She stays active although she is limited by some back pain. The patient denies any new symptoms such as chest discomfort, neck or arm discomfort. There has been no new shortness of breath, PND or orthopnea. There have been no reported palpitations, presyncope or syncope.    Past Medical History:  Diagnosis Date   Aneurysm (HCC) 01-16-11   "lt .side of head not intra(cavity area)/Found during series of tests to r/o TIA's"-report from [facility] showed  Lt. ICA  aneurysm   Arthritis 01-16-11   osteoarthritis,-knees, hips, ankles. 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 tight chested-"no inhalers in yrs.   Complication of anesthesia 01-16-11   Pt. very slow to awaken and was days before feeling like herself.    Past Surgical History:  Procedure Laterality Date   ABDOMINAL HYSTERECTOMY     BREAST CYST EXCISION  01-16-11   several excisions in the past   JOINT REPLACEMENT  01-16-11   Rt. great toe ball joint replacement-metal   REDUCTION MAMMAPLASTY  01-16-11    Bil. '86( problems with s/p surgical clot at rt. breast drainage site)   TOTAL HIP ARTHROPLASTY  01/20/2011   Procedure: TOTAL HIP ARTHROPLASTY ANTERIOR APPROACH;  Surgeon: Shelda Pal;  Location: [facility];  Service: Orthopedics;  Laterality: Right;     Current Outpatient Medications  Medication Sig Dispense Refill   albuterol (VENTOLIN HFA) 108 (90 Base) MCG/ACT inhaler albuterol sulfate HFA 90 mcg/actuation aerosol inhaler  INL 2 PFS PO Q 6 H PRN FOR WHZ     aspirin 325 MG tablet Take 325 mg daily by mouth.     cholecalciferol (VITAMIN D) 1000 UNITS tablet Take 1,000 Units by mouth daily.       diphenhydrAMINE (BENADRYL) 25 mg capsule Take 25 mg by mouth every 6 (six) hours as needed. Allergies       EPINEPHrine (EPIPEN JR) 0.15 MG/0.3ML injection Inject 0.15 mg into the muscle as needed. Allergy reaction      Homeopathic Products (ARNICARE) GEL Apply 1 application topically 4 (four) times daily as needed. pain      Multiple Vitamins-Minerals (MULTIVITAMINS THER. W/MINERALS) TABS Take 1 tablet by mouth daily.       triamcinolone ointment (KENALOG) 0.5 % Apply to affected area twice daily     No current facility-administered medications for this visit.    Allergies:   Erythromycin, Ciprofloxacin hcl, Contrast media [iodinated diagnostic agents],  Lidocaine, Penicillins, and Sulfa drugs cross reactors    Social History:  The patient  reports that she quit smoking about 37 years ago. Her smoking use included cigarettes. She has never used smokeless tobacco. She reports current alcohol use of about 1.0 standard drink per week. She reports that she does not use drugs.   Family History:  The patient's family history includes Heart disease in her mother.   Her mother had a pacemaker but there was no early heart disease.  She really does not know her father's history.   ROS:  Please see the history of present illness.   Otherwise, review of systems are positive for back pain.  She has had a  23 pound weight loss because she changed her diet..   All other systems are reviewed and negative.    PHYSICAL EXAM: VS:  BP 118/62   Pulse 67   Ht 5\' 8"  (1.727 m)   Wt 120 lb (54.4 kg)   SpO2 99%   BMI 18.25 kg/m  , BMI Body mass index is 18.25 kg/m. GENERAL:  Well appearing HEENT:  Pupils equal round and reactive, fundi not visualized, oral mucosa unremarkable NECK:  No jugular venous distention, waveform within normal limits, carotid upstroke brisk and symmetric, no bruits, no thyromegaly LYMPHATICS:  No cervical, inguinal adenopathy LUNGS:  Clear to auscultation bilaterally BACK:  No CVA tenderness CHEST:  Unremarkable HEART:  PMI not displaced or sustained,S1 and S2 within normal limits, no S3, no S4, no clicks, no rubs, no murmurs ABD:  Flat, positive bowel sounds normal in frequency in pitch, no bruits, no rebound, no guarding, no midline pulsatile mass, no hepatomegaly, no splenomegaly EXT:  2 plus pulses throughout, no edema, no cyanosis no clubbing SKIN:  No rashes no nodules NEURO:  Cranial nerves II through XII grossly intact, motor grossly intact throughout PSYCH:  Cognitively intact, oriented to person place and time    EKG:  EKG is ordered today. The ekg ordered today demonstrates sinus rhythm, rate 57, axis within normal limits, intervals within normal limits, premature atrial contractions, no acute ST-T wave changes.   Recent Labs: No results found for requested labs within last 8760 hours.    Lipid Panel    Component Value Date/Time   CHOL (H) 01/18/2010 0622    231        ATP III CLASSIFICATION:  <200     mg/dL   Desirable  01/20/2010  mg/dL   Borderline High  093-235    mg/dL   High          TRIG >=573 01/18/2010 0622   HDL 71 01/18/2010 0622   CHOLHDL 3.3 01/18/2010 0622   VLDL 23 01/18/2010 0622   LDLCALC (H) 01/18/2010 0622    137        Total Cholesterol/HDL:CHD Risk Coronary Heart Disease Risk Table                     Men   Women  1/2 Average  Risk   3.4   3.3  Average Risk       5.0   4.4  2 X Average Risk   9.6   7.1  3 X Average Risk  23.4   11.0        Use the calculated Patient Ratio above and the CHD Risk Table to determine the patient's CHD Risk.        ATP III CLASSIFICATION (LDL):  <100  mg/dL   Optimal  950-932  mg/dL   Near or Above                    Optimal  130-159  mg/dL   Borderline  671-245  mg/dL   High  >809     mg/dL   Very High      Wt Readings from Last 3 Encounters:  01/13/21 120 lb (54.4 kg)  12/08/18 147 lb (66.7 kg)  01/20/11 139 lb (63 kg)      Other studies Reviewed: Additional studies/ records that were reviewed today include: Labs. Review of the above records demonstrates:  Please see elsewhere in the note.     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  PFO: There was a questionable PFO on distant echo but she has not had any symptoms so no further evaluation nor intervention on this is indicated.  No further imaging at this point.  DYSLIPIDEMIA: She has had an elevated LDL last year but she since has lost about 25 pounds so she is going to get this checked again.  I would not think medications is indicated.  Her LDL was 137 her HDL of 89.  She is going to get a repeat fasting cholesterol profile now that she is lost weight.   Current medicines are reviewed at length with the patient today.  The patient does not have concerns regarding medicines.  The following changes have been made:  no change  Labs/ tests ordered today include:   Orders Placed This Encounter  Procedures   EKG 12-Lead      Disposition:   FU with me in 18 months.     Signed, Rollene Rotunda, MD  01/13/2021 11:48 AM    [facility]

## 2021-01-13 ENCOUNTER — Encounter: Payer: Self-pay | Admitting: Cardiology

## 2021-01-13 ENCOUNTER — Ambulatory Visit (INDEPENDENT_AMBULATORY_CARE_PROVIDER_SITE_OTHER): Payer: Medicare Other | Admitting: Cardiology

## 2021-01-13 ENCOUNTER — Other Ambulatory Visit: Payer: Self-pay

## 2021-01-13 VITALS — BP 118/62 | HR 67 | Ht 68.0 in | Wt 120.0 lb

## 2021-01-13 DIAGNOSIS — I671 Cerebral aneurysm, nonruptured: Secondary | ICD-10-CM | POA: Diagnosis not present

## 2021-01-13 DIAGNOSIS — Q2112 Patent foramen ovale: Secondary | ICD-10-CM | POA: Diagnosis not present

## 2021-01-13 NOTE — Patient Instructions (Addendum)
Medication Instructions:  The current medical regimen is effective;  continue present plan and medications as directed. Please refer to the Current Medication list given to you today.   *If you need a refill on your cardiac medications before your next appointment, please call your pharmacy*  Lab Work:   Testing/Procedures:  NONE    NONE  Special Instructions NONE  Follow-Up: Your next appointment:  18 month(s) 07-2022 In Person with Rollene Rotunda, MD  or Edd Fabian, FNP, Micah Flesher, PA-C, Marjie Skiff, PA-C, Robet Leu, PA-C, Juanda Crumble, PA-C, Joni Reining, DNP, ANP, Azalee Course, PA-C, or Bernadene Person, NP      Please call our office 2 months in advance to schedule this appointment   At [facility], you and your health needs are our priority.  As part of our continuing mission to provide you with exceptional heart care, we have created designated Provider Care Teams.  These Care Teams include your primary Cardiologist (physician) and Advanced Practice Providers (APPs -  Physician Assistants and Nurse Practitioners) who all work together to provide you with the care you need, when you need it.

## 2021-03-24 DIAGNOSIS — Z780 Asymptomatic menopausal state: Secondary | ICD-10-CM | POA: Diagnosis not present

## 2021-03-24 DIAGNOSIS — E785 Hyperlipidemia, unspecified: Secondary | ICD-10-CM | POA: Diagnosis not present

## 2021-03-24 DIAGNOSIS — R7989 Other specified abnormal findings of blood chemistry: Secondary | ICD-10-CM | POA: Diagnosis not present

## 2021-03-26 DIAGNOSIS — E785 Hyperlipidemia, unspecified: Secondary | ICD-10-CM | POA: Diagnosis not present

## 2021-03-26 DIAGNOSIS — Z0000 Encounter for general adult medical examination without abnormal findings: Secondary | ICD-10-CM | POA: Diagnosis not present

## 2021-03-28 DIAGNOSIS — R7989 Other specified abnormal findings of blood chemistry: Secondary | ICD-10-CM | POA: Diagnosis not present

## 2021-03-28 DIAGNOSIS — M5126 Other intervertebral disc displacement, lumbar region: Secondary | ICD-10-CM | POA: Diagnosis not present

## 2021-03-28 DIAGNOSIS — D649 Anemia, unspecified: Secondary | ICD-10-CM | POA: Diagnosis not present

## 2021-03-28 DIAGNOSIS — J329 Chronic sinusitis, unspecified: Secondary | ICD-10-CM | POA: Diagnosis not present

## 2021-03-28 DIAGNOSIS — Z1339 Encounter for screening examination for other mental health and behavioral disorders: Secondary | ICD-10-CM | POA: Diagnosis not present

## 2021-03-28 DIAGNOSIS — M1990 Unspecified osteoarthritis, unspecified site: Secondary | ICD-10-CM | POA: Diagnosis not present

## 2021-03-28 DIAGNOSIS — Z0000 Encounter for general adult medical examination without abnormal findings: Secondary | ICD-10-CM | POA: Diagnosis not present

## 2021-03-28 DIAGNOSIS — I671 Cerebral aneurysm, nonruptured: Secondary | ICD-10-CM | POA: Diagnosis not present

## 2021-03-28 DIAGNOSIS — Z1331 Encounter for screening for depression: Secondary | ICD-10-CM | POA: Diagnosis not present

## 2021-03-28 DIAGNOSIS — Q2112 Patent foramen ovale: Secondary | ICD-10-CM | POA: Diagnosis not present

## 2021-03-28 DIAGNOSIS — M48061 Spinal stenosis, lumbar region without neurogenic claudication: Secondary | ICD-10-CM | POA: Diagnosis not present

## 2021-03-28 DIAGNOSIS — E785 Hyperlipidemia, unspecified: Secondary | ICD-10-CM | POA: Diagnosis not present

## 2021-04-16 DIAGNOSIS — M5416 Radiculopathy, lumbar region: Secondary | ICD-10-CM | POA: Diagnosis not present

## 2021-04-25 DIAGNOSIS — M5416 Radiculopathy, lumbar region: Secondary | ICD-10-CM | POA: Diagnosis not present

## 2021-04-25 DIAGNOSIS — M79661 Pain in right lower leg: Secondary | ICD-10-CM | POA: Diagnosis not present

## 2021-04-25 DIAGNOSIS — M79662 Pain in left lower leg: Secondary | ICD-10-CM | POA: Diagnosis not present

## 2021-04-30 DIAGNOSIS — M79662 Pain in left lower leg: Secondary | ICD-10-CM | POA: Diagnosis not present

## 2021-04-30 DIAGNOSIS — M79661 Pain in right lower leg: Secondary | ICD-10-CM | POA: Diagnosis not present

## 2021-04-30 DIAGNOSIS — M5416 Radiculopathy, lumbar region: Secondary | ICD-10-CM | POA: Diagnosis not present

## 2021-05-07 DIAGNOSIS — M79662 Pain in left lower leg: Secondary | ICD-10-CM | POA: Diagnosis not present

## 2021-05-07 DIAGNOSIS — M79661 Pain in right lower leg: Secondary | ICD-10-CM | POA: Diagnosis not present

## 2021-06-19 DIAGNOSIS — Z1211 Encounter for screening for malignant neoplasm of colon: Secondary | ICD-10-CM | POA: Diagnosis not present

## 2021-06-19 DIAGNOSIS — J45998 Other asthma: Secondary | ICD-10-CM | POA: Diagnosis not present

## 2021-06-19 DIAGNOSIS — K641 Second degree hemorrhoids: Secondary | ICD-10-CM | POA: Diagnosis not present

## 2021-06-19 DIAGNOSIS — E782 Mixed hyperlipidemia: Secondary | ICD-10-CM | POA: Diagnosis not present

## 2021-09-19 DIAGNOSIS — Z1211 Encounter for screening for malignant neoplasm of colon: Secondary | ICD-10-CM | POA: Diagnosis not present

## 2021-09-19 DIAGNOSIS — K648 Other hemorrhoids: Secondary | ICD-10-CM | POA: Diagnosis not present

## 2022-01-03 DIAGNOSIS — Z23 Encounter for immunization: Secondary | ICD-10-CM | POA: Diagnosis not present

## 2022-03-19 DIAGNOSIS — Z23 Encounter for immunization: Secondary | ICD-10-CM | POA: Diagnosis not present

## 2022-03-30 ENCOUNTER — Other Ambulatory Visit: Payer: Self-pay | Admitting: Internal Medicine

## 2022-03-30 DIAGNOSIS — N6489 Other specified disorders of breast: Secondary | ICD-10-CM

## 2022-04-06 DIAGNOSIS — D649 Anemia, unspecified: Secondary | ICD-10-CM | POA: Diagnosis not present

## 2022-04-06 DIAGNOSIS — E785 Hyperlipidemia, unspecified: Secondary | ICD-10-CM | POA: Diagnosis not present

## 2022-04-14 ENCOUNTER — Ambulatory Visit: Payer: Medicare Other | Admitting: Cardiology

## 2022-04-14 ENCOUNTER — Ambulatory Visit
Admission: RE | Admit: 2022-04-14 | Discharge: 2022-04-14 | Disposition: A | Discharge status: Home / Self Care | Payer: Medicare Other | Source: Ambulatory Visit | Attending: Internal Medicine | Admitting: Internal Medicine

## 2022-04-14 DIAGNOSIS — M1990 Unspecified osteoarthritis, unspecified site: Secondary | ICD-10-CM | POA: Diagnosis not present

## 2022-04-14 DIAGNOSIS — R7989 Other specified abnormal findings of blood chemistry: Secondary | ICD-10-CM | POA: Diagnosis not present

## 2022-04-14 DIAGNOSIS — D649 Anemia, unspecified: Secondary | ICD-10-CM | POA: Diagnosis not present

## 2022-04-14 DIAGNOSIS — R82998 Other abnormal findings in urine: Secondary | ICD-10-CM | POA: Diagnosis not present

## 2022-04-14 DIAGNOSIS — R5383 Other fatigue: Secondary | ICD-10-CM | POA: Diagnosis not present

## 2022-04-14 DIAGNOSIS — L989 Disorder of the skin and subcutaneous tissue, unspecified: Secondary | ICD-10-CM | POA: Diagnosis not present

## 2022-04-14 DIAGNOSIS — M5126 Other intervertebral disc displacement, lumbar region: Secondary | ICD-10-CM | POA: Diagnosis not present

## 2022-04-14 DIAGNOSIS — Z0000 Encounter for general adult medical examination without abnormal findings: Secondary | ICD-10-CM | POA: Diagnosis not present

## 2022-04-14 DIAGNOSIS — E785 Hyperlipidemia, unspecified: Secondary | ICD-10-CM | POA: Diagnosis not present

## 2022-04-14 DIAGNOSIS — N6489 Other specified disorders of breast: Secondary | ICD-10-CM

## 2022-04-14 DIAGNOSIS — I671 Cerebral aneurysm, nonruptured: Secondary | ICD-10-CM | POA: Diagnosis not present

## 2022-04-14 DIAGNOSIS — M48061 Spinal stenosis, lumbar region without neurogenic claudication: Secondary | ICD-10-CM | POA: Diagnosis not present

## 2022-04-14 DIAGNOSIS — Z1331 Encounter for screening for depression: Secondary | ICD-10-CM | POA: Diagnosis not present

## 2022-04-14 DIAGNOSIS — Z1339 Encounter for screening examination for other mental health and behavioral disorders: Secondary | ICD-10-CM | POA: Diagnosis not present

## 2022-04-14 DIAGNOSIS — R922 Inconclusive mammogram: Secondary | ICD-10-CM | POA: Diagnosis not present

## 2022-04-28 NOTE — Progress Notes (Addendum)
Cardiology Office Note   Date:  04/30/2022   ID:  Julie Tapia, Julie Tapia 16-Oct-1950, MRN [id number]  PCP:  Christain Sacramento, MD  Cardiologist:   Minus Breeding, MD Referring:  Christain Sacramento, MD  Chief Complaint  Patient presents with   PFO       History of Present Illness: Julie Tapia is a 72 y.o. female who presents for evaluation of a PFO .  Echocardiogram from [place] in February 2012 suggests a possible PFO.  She has had a history of TIAs some years ago and has been treated with full dose aspirin without any problems over many years.  She was seen at [facility]. She was scheduled for follow CTA in 2025.  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  Since she was last seen she has had no new cardiovascular complaints.  She has had no chest pressure, neck or arm discomfort.  She has not had any new shortness of breath, PND or orthopnea.  She has had no weight gain or edema.  She is really not able to exercise as much because of her back pain but she does walk up to 10,000 steps in her job at a [employer].   Past Medical History:  Diagnosis Date   Aneurysm ([place]) 01-16-11   "lt .side of head not intra(cavity area)/Found during series of tests to r/o TIA's"-report from [facility] showed  Lt. ICA  aneurysm   Arthritis 01-16-11   osteoarthritis,-knees, hips, ankles. 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 tight chested-"no inhalers in yrs.   Complication of anesthesia 01-16-11   Pt. very slow to awaken and was days before feeling like herself.    Past Surgical History:  Procedure Laterality Date   ABDOMINAL HYSTERECTOMY     BREAST CYST EXCISION  01-16-11   several excisions in the past   JOINT REPLACEMENT  01-16-11   Rt. great toe ball joint replacement-metal   REDUCTION MAMMAPLASTY  01-16-11   Bil. '86( problems with s/p  surgical clot at rt. breast drainage site)   TOTAL HIP ARTHROPLASTY  01/20/2011   Procedure: TOTAL HIP ARTHROPLASTY ANTERIOR APPROACH;  Surgeon: Mauri Pole;  Location: [facility];  Service: Orthopedics;  Laterality: Right;     Current Outpatient Medications  Medication Sig Dispense Refill   albuterol (VENTOLIN HFA) 108 (90 Base) MCG/ACT inhaler albuterol sulfate HFA 90 mcg/actuation aerosol inhaler  INL 2 PFS PO Q 6 H PRN FOR WHZ     aspirin 325 MG tablet Take 325 mg daily by mouth.     cholecalciferol (VITAMIN D) 1000 UNITS tablet Take 1,000 Units by mouth daily.       diphenhydrAMINE (BENADRYL) 25 mg capsule Take 25 mg by mouth every 6 (six) hours as needed. Allergies       EPINEPHrine (EPIPEN JR) 0.15 MG/0.3ML injection Inject 0.15 mg into the muscle as needed. Allergy reaction      Homeopathic Products (ARNICARE) GEL Apply 1 application topically 4 (four) times daily as needed. pain      Multiple Vitamins-Minerals (MULTIVITAMINS THER. W/MINERALS) TABS Take 1 tablet by mouth daily.       triamcinolone ointment (KENALOG) 0.5 % Apply to affected area twice daily     No current facility-administered medications for this visit.    Allergies:   Erythromycin,  Ciprofloxacin hcl, Contrast media [iodinated contrast media], Lidocaine, Penicillins, and Sulfa drugs cross reactors   ROS:  Please see the history of present illness.   Otherwise, review of systems are positive for back pain.  All other systems are reviewed and negative.    PHYSICAL EXAM: VS:  BP 120/78   Pulse 60   Ht 5' 7.5" (1.715 m)   Wt 128 lb (58.1 kg)   SpO2 99%   BMI 19.75 kg/m  , BMI Body mass index is 19.75 kg/m. GENERAL:  Well appearing NECK:  No jugular venous distention, waveform within normal limits, carotid upstroke brisk and symmetric, no bruits, no thyromegaly LUNGS:  Clear to auscultation bilaterally CHEST:  Unremarkable HEART:  PMI not displaced or sustained,S1 and S2 within normal limits, no S3, no S4,  no clicks, no rub no murmurs ABD:  Flat, positive bowel sounds normal in frequency in pitch, no bruits, no rebound, no guarding, no midline pulsatile mass, no hepatomegaly, no splenomegaly EXT:  2 plus pulses throughout, no edema, no cyanosis no clubbing   EKG:  EKG is ordered today. The ekg ordered today demonstrates sinus rhythm, rate 60, axis within normal limits, intervals within normal limits, premature atrial contractions, no acute ST-T wave changes.   Recent Labs: No results found for requested labs within last 365 days.    Lipid Panel    Component Value Date/Time   CHOL (H) 01/18/2010 0622    231        ATP III CLASSIFICATION:  <200     mg/dL   Desirable  200-239  mg/dL   Borderline High  >=240    mg/dL   High          TRIG 116 01/18/2010 0622   HDL 71 01/18/2010 0622   CHOLHDL 3.3 01/18/2010 0622   VLDL 23 01/18/2010 0622   LDLCALC (H) 01/18/2010 0622    137        Total Cholesterol/HDL:CHD Risk Coronary Heart Disease Risk Table                     Men   Women  1/2 Average Risk   3.4   3.3  Average Risk       5.0   4.4  2 X Average Risk   9.6   7.1  3 X Average Risk  23.4   11.0        Use the calculated Patient Ratio above and the CHD Risk Table to determine the patient's CHD Risk.        ATP III CLASSIFICATION (LDL):  <100     mg/dL   Optimal  100-129  mg/dL   Near or Above                    Optimal  130-159  mg/dL   Borderline  160-189  mg/dL   High  >190     mg/dL   Very High      Wt Readings from Last 3 Encounters:  04/30/22 128 lb (58.1 kg)  01/13/21 120 lb (54.4 kg)  12/08/18 147 lb (66.7 kg)      Other studies Reviewed: Additional studies/ records that were reviewed today include: Labs. Review of the above records demonstrates:  Please see elsewhere in the note.     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   PFO: I will order an echocardiogram as I have not imaged  this in years and she had  a history of TIAs.  I did this with agitated saline.   DYSLIPIDEMIA: LDL is 140 with an HDL of 85.  She prefer not to be on meds.  I will screen her with a coronary calcium score which may help [place] determine goals of therapy.  She and I had a long discussion about this.   Current medicines are reviewed at length with the patient today.  The patient does not have concerns regarding medicines.  The following changes have been made:  None  Labs/ tests ordered today include: See elsewhere  Orders Placed This Encounter  Procedures   CT CARDIAC SCORING (SELF PAY ONLY)   EKG 12-Lead   ECHOCARDIOGRAM COMPLETE      Disposition:   FU with me in 12 months.     Signed, Rollene Rotunda, MD  04/30/2022 9:32 AM    [facility]

## 2022-04-30 ENCOUNTER — Encounter: Payer: Self-pay | Admitting: Cardiology

## 2022-04-30 ENCOUNTER — Ambulatory Visit: Payer: Medicare Other | Attending: Cardiology | Admitting: Cardiology

## 2022-04-30 VITALS — BP 120/78 | HR 60 | Ht 67.5 in | Wt 128.0 lb

## 2022-04-30 DIAGNOSIS — I671 Cerebral aneurysm, nonruptured: Secondary | ICD-10-CM | POA: Insufficient documentation

## 2022-04-30 DIAGNOSIS — Q2112 Patent foramen ovale: Secondary | ICD-10-CM | POA: Diagnosis not present

## 2022-04-30 DIAGNOSIS — E785 Hyperlipidemia, unspecified: Secondary | ICD-10-CM | POA: Insufficient documentation

## 2022-04-30 NOTE — Patient Instructions (Signed)
Medication Instructions:  Your physician recommends that you continue on your current medications as directed. Please refer to the Current Medication list given to you today.  *If you need a refill on your cardiac medications before your next appointment, please call your pharmacy*  Testing/Procedures: Your physician has requested that you have an echocardiogram. Echocardiography is a painless test that uses sound waves to create images of your heart. It provides your doctor with information about the size and shape of your heart and how well your heart's chambers and valves are working. This procedure takes approximately one hour. There are no restrictions for this procedure. Please do NOT wear cologne, perfume, aftershave, or lotions (deodorant is allowed). Please arrive 15 minutes prior to your appointment time.  CT coronary calcium score.   Test locations:  [place]   This is $99 out of pocket.   Coronary CalciumScan A coronary calcium scan is an imaging test used to look for deposits of calcium and other fatty materials (plaques) in the inner lining of the blood vessels of the heart (coronary arteries). These deposits of calcium and plaques can partly clog and narrow the coronary arteries without producing any symptoms or warning signs. This puts a person at risk for a heart attack. This test can detect these deposits before symptoms develop. Tell a health care provider about: Any allergies you have. All medicines you are taking, including vitamins, herbs, eye drops, creams, and over-the-counter medicines. Any problems you or family members have had with anesthetic medicines. Any blood disorders you have. Any surgeries you have had. Any medical conditions you have. Whether you are pregnant or may be pregnant. What are the risks? Generally, this is a safe procedure. However, problems may occur, including: Harm to a pregnant woman and her unborn  baby. This test involves the use of radiation. Radiation exposure can be dangerous to a pregnant woman and her unborn baby. If you are pregnant, you generally should not have this procedure done. Slight increase in the risk of cancer. This is because of the radiation involved in the test. What happens before the procedure? No preparation is needed for this procedure. What happens during the procedure? You will undress and remove any jewelry around your neck or chest. You will put on a hospital gown. Sticky electrodes will be placed on your chest. The electrodes will be connected to an electrocardiogram (ECG) machine to record a tracing of the electrical activity of your heart. A CT scanner will take pictures of your heart. During this time, you will be asked to lie still and hold your breath for 2-3 seconds while a picture of your heart is being taken. The procedure may vary among health care providers and hospitals. What happens after the procedure? You can get dressed. You can return to your normal activities. It is up to you to get the results of your test. Ask your health care provider, or the department that is doing the test, when your results will be ready. Summary A coronary calcium scan is an imaging test used to look for deposits of calcium and other fatty materials (plaques) in the inner lining of the blood vessels of the heart (coronary arteries). Generally, this is a safe procedure. Tell your health care provider if you are pregnant or may be pregnant. No preparation is needed for this procedure. A CT scanner will take pictures of your heart. You can return to your normal activities after the scan is done. This information  is not intended to replace advice given to you by your health care provider. Make sure you discuss any questions you have with your health care provider. Document Released: 08/15/2007 Document Revised: 01/06/2016 Document Reviewed: 01/06/2016 Elsevier Interactive  Patient Education  2017 [place]: At [facility], you and your health needs are our priority.  As part of our continuing mission to provide you with exceptional heart care, we have created designated Provider Care Teams.  These Care Teams include your primary Cardiologist (physician) and Advanced Practice Providers (APPs -  Physician Assistants and Nurse Practitioners) who all work together to provide you with the care you need, when you need it.  We recommend signing up for the patient portal called "MyChart".  Sign up information is provided on this After Visit Summary.  MyChart is used to connect with patients for Virtual Visits (Telemedicine).  Patients are able to view lab/test results, encounter notes, upcoming appointments, etc.  Non-urgent messages can be sent to your provider as well.   To learn more about what you can do with MyChart, go to [email or web address].    Your next appointment:   12 month(s)  Provider:   Minus Breeding, MD

## 2022-05-18 DIAGNOSIS — H25013 Cortical age-related cataract, bilateral: Secondary | ICD-10-CM | POA: Diagnosis not present

## 2022-05-18 DIAGNOSIS — H2513 Age-related nuclear cataract, bilateral: Secondary | ICD-10-CM | POA: Diagnosis not present

## 2022-06-01 ENCOUNTER — Ambulatory Visit (HOSPITAL_BASED_OUTPATIENT_CLINIC_OR_DEPARTMENT_OTHER)
Admission: RE | Admit: 2022-06-01 | Discharge: 2022-06-01 | Disposition: A | Discharge status: Home / Self Care | Payer: Medicare Other | Source: Ambulatory Visit | Attending: Cardiology | Admitting: Cardiology

## 2022-06-01 DIAGNOSIS — E785 Hyperlipidemia, unspecified: Secondary | ICD-10-CM | POA: Insufficient documentation

## 2022-06-03 ENCOUNTER — Ambulatory Visit (HOSPITAL_COMMUNITY): Payer: Medicare Other | Attending: Internal Medicine

## 2022-06-03 DIAGNOSIS — Q2112 Patent foramen ovale: Secondary | ICD-10-CM | POA: Diagnosis not present

## 2022-06-03 LAB — ECHOCARDIOGRAM COMPLETE BUBBLE STUDY
Area-P 1/2: 2.87 cm2
MV M vel: 4.91 m/s
MV Peak grad: 96.4 mmHg
P 1/2 time: 624 msec
S' Lateral: 3 cm

## 2022-06-05 IMAGING — MG DIGITAL DIAGNOSTIC BILAT W/ TOMO W/ CAD
6 of 12 series · 6 of 36 positions shown · non-contrast
Comparison: Previous exam(s).
COMPARISON: Previous exam(s).

Addendum:
CLINICAL DATA: One year follow-up for probably benign asymmetry
without sonographic correlate in the LEFT breast. History of
bilateral breast reduction.

EXAM:
DIGITAL DIAGNOSTIC BILATERAL MAMMOGRAM WITH TOMOSYNTHESIS AND CAD
TECHNIQUE: Bilateral digital diagnostic mammography and breast tomosynthesis
was performed. The images were evaluated with computer-aided
detection.

[L CC synth-2D]
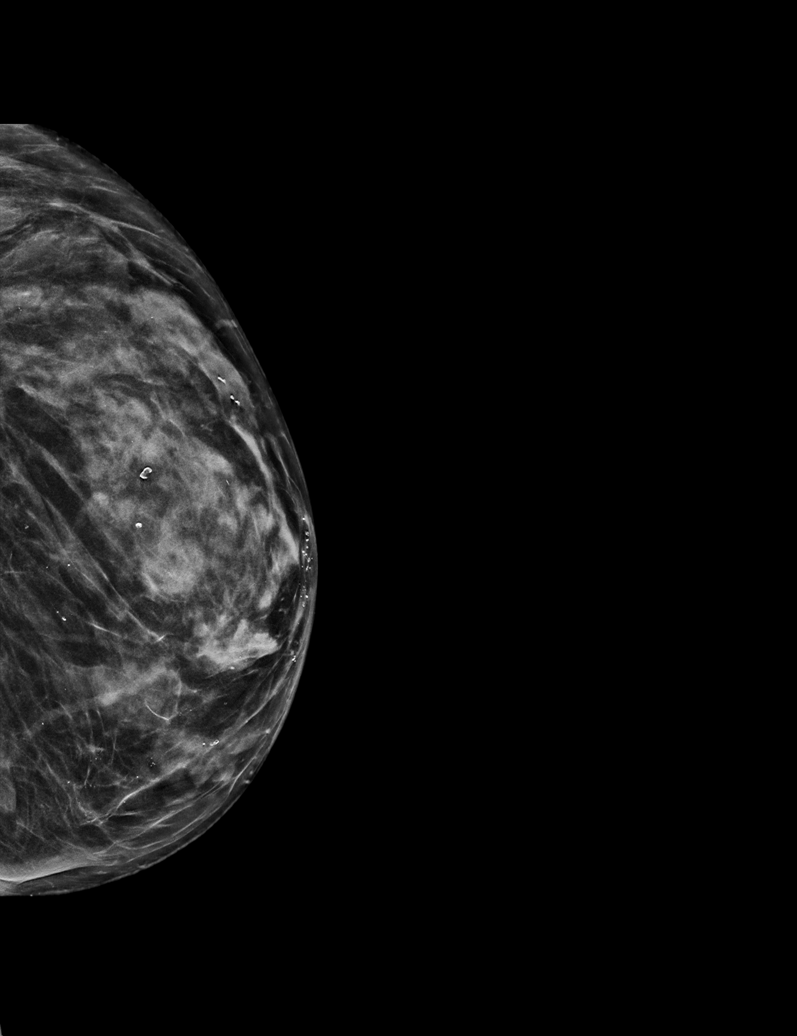

[R CC synth-2D]
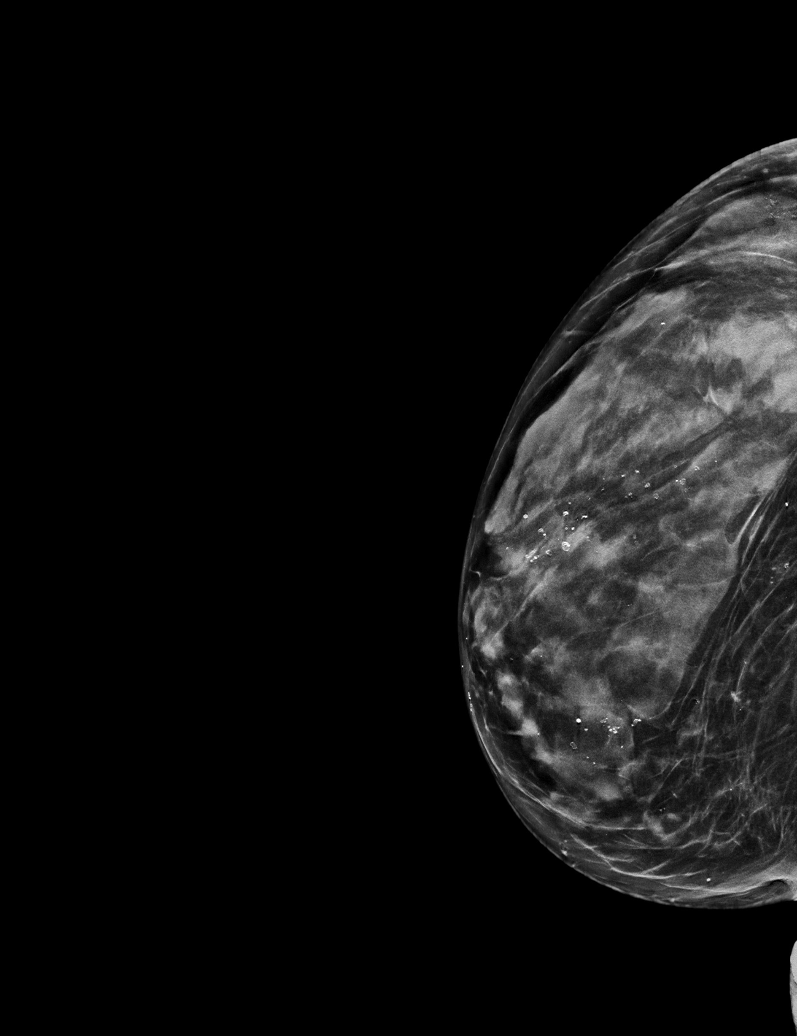

[L ML synth-2D]
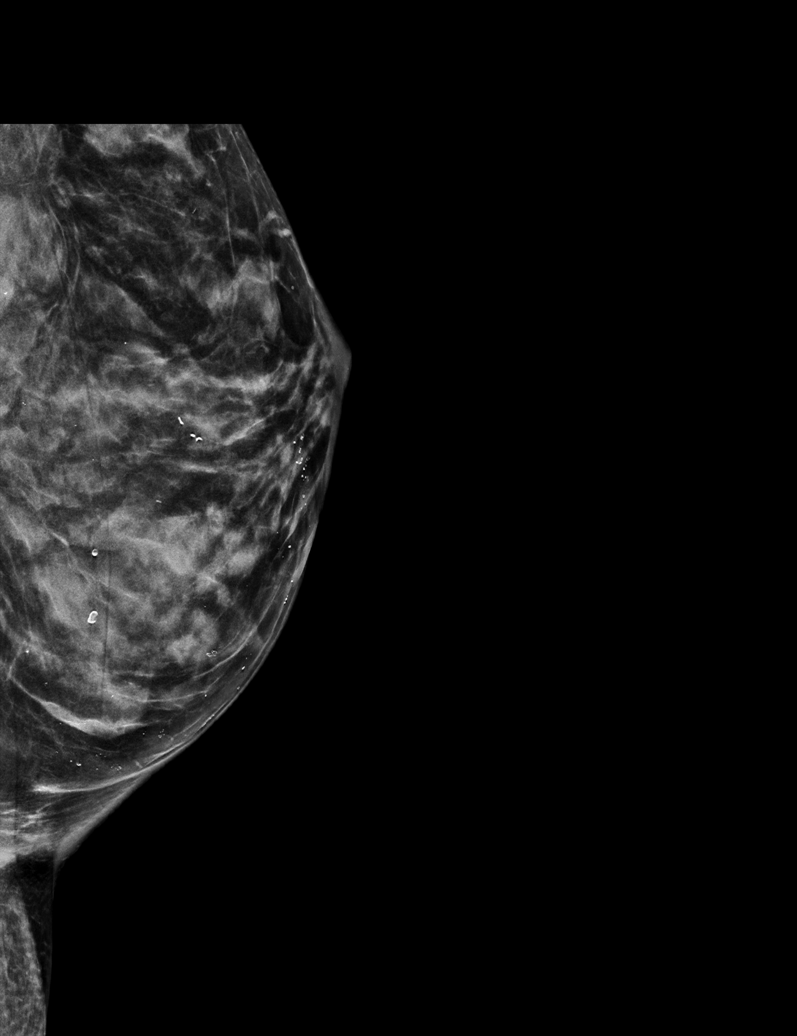

[R MLO synth-2D]
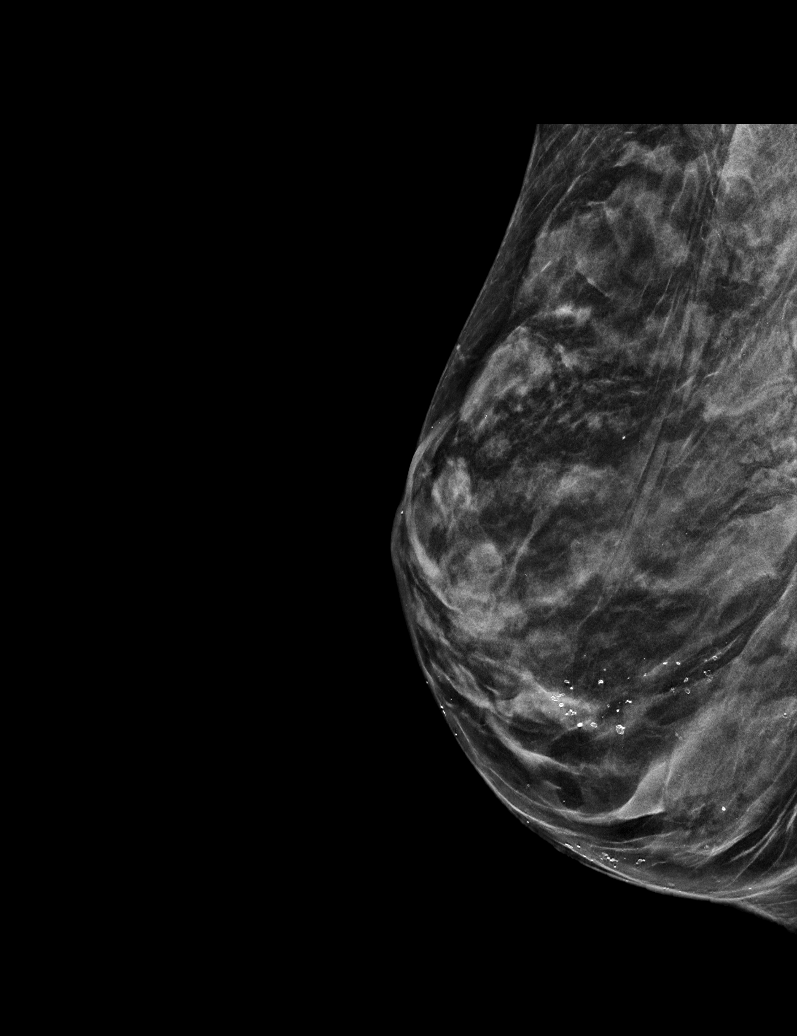

[L XCCM synth-2D]
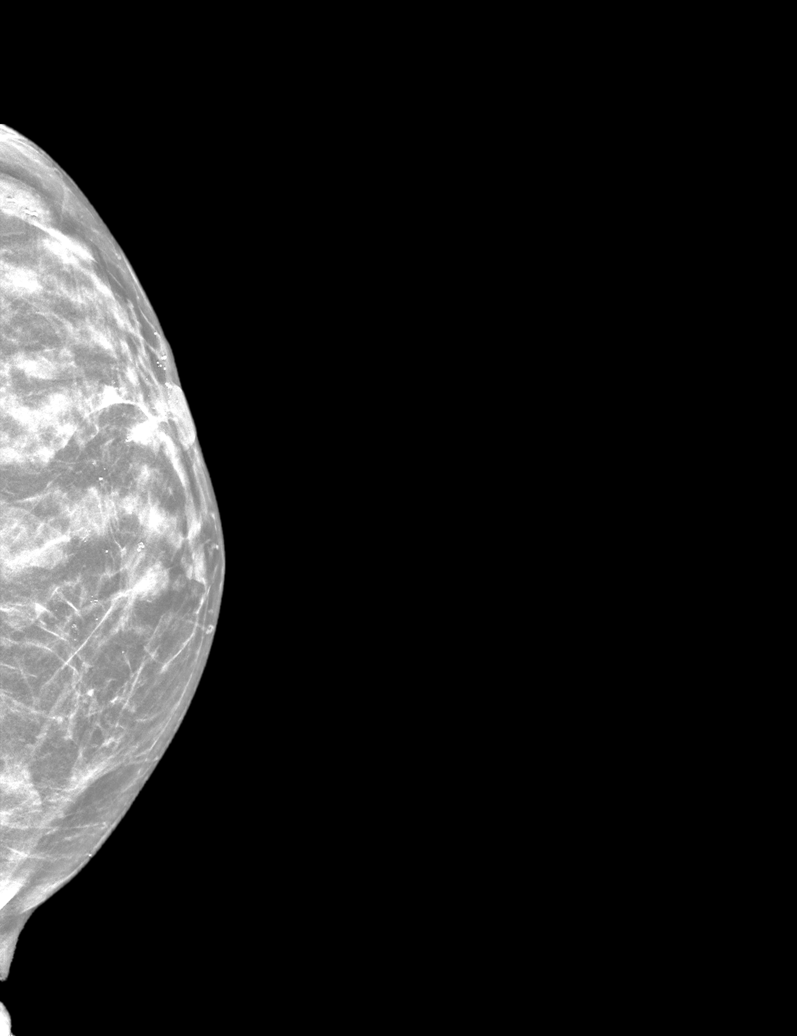

[L MLO synth-2D]
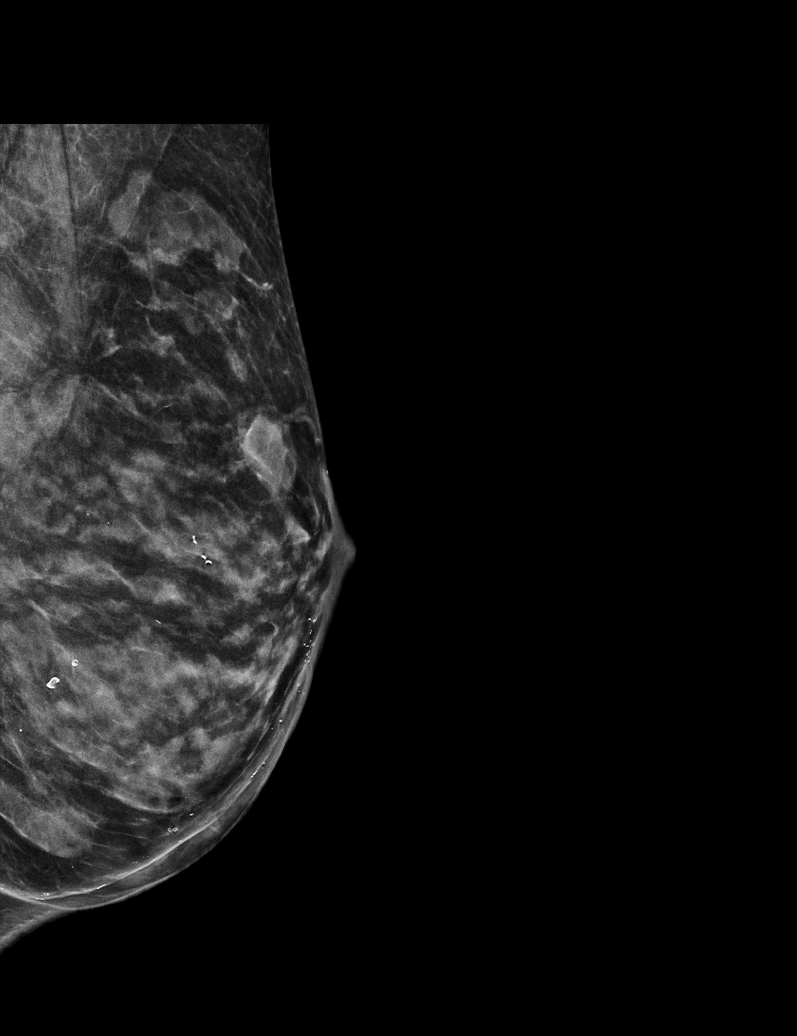

[6 of 36 positions shown; findings below may reference images not displayed]

ACR Breast Density Category c: The breast tissue is heterogeneously
dense, which may obscure small masses.
FINDINGS: Postoperative changes bilaterally. RIGHT breast is negative. In the
posteromedial aspect of the LEFT breast, there is stable appearance
of focal asymmetry, not associated with distortion or suspicious
microcalcifications.
IMPRESSION: One year stability of probably benign asymmetry in the posteromedial
portion of the LEFT breast. No mammographic evidence for malignancy.

RECOMMENDATION:
Recommend bilateral diagnostic mammogram to complete follow-up of
the LEFT breast.

I have discussed the findings and recommendations with the patient.
If applicable, a reminder letter will be sent to the patient
regarding the next appointment.

BI-RADS CATEGORY  3: Probably benign.

ADDENDUM:
RECOMMENDATION:

Recommend bilateral diagnostic mammogram in 1 year to complete
follow-up of the LEFT breast. I have discussed the findings and
recommendations with the patient. If applicable, a reminder letter
will be sent to the patient regarding the next appointment.

BI-RADS CATEGORY  3: Probably benign.

*** End of Addendum ***
ACR Breast Density Category c: The breast tissue is heterogeneously
dense, which may obscure small masses.
FINDINGS: Postoperative changes bilaterally. RIGHT breast is negative. In the
posteromedial aspect of the LEFT breast, there is stable appearance
of focal asymmetry, not associated with distortion or suspicious
microcalcifications.
IMPRESSION: One year stability of probably benign asymmetry in the posteromedial
portion of the LEFT breast. No mammographic evidence for malignancy.

RECOMMENDATION:
Recommend bilateral diagnostic mammogram to complete follow-up of
the LEFT breast.

I have discussed the findings and recommendations with the patient.
If applicable, a reminder letter will be sent to the patient
regarding the next appointment.

BI-RADS CATEGORY  3: Probably benign.

## 2022-06-12 ENCOUNTER — Encounter: Payer: Self-pay | Admitting: *Deleted

## 2022-06-15 ENCOUNTER — Telehealth: Payer: Self-pay | Admitting: *Deleted

## 2022-06-15 ENCOUNTER — Other Ambulatory Visit: Payer: Self-pay | Admitting: *Deleted

## 2022-06-15 DIAGNOSIS — E785 Hyperlipidemia, unspecified: Secondary | ICD-10-CM

## 2022-06-15 DIAGNOSIS — R931 Abnormal findings on diagnostic imaging of heart and coronary circulation: Secondary | ICD-10-CM

## 2022-06-15 MED ORDER — ROSUVASTATIN CALCIUM 10 MG PO TABS: 10.0000 mg | ORAL_TABLET | Freq: Every day | ORAL | 3 refills | Status: DC

## 2022-06-15 NOTE — Telephone Encounter (Signed)
-----   Message from Rollene Rotunda, MD sent at 06/12/2022  1:06 PM EDT ----- Positive calcium indicating CAD.    I would like to follow up with a POET ([employer] Treadmill).  Also, she agrees to start Crestor 10 mg po daily.  Please call in a prescription and repeat a lipid in 3 months. Send results to Alysia Penna, MD

## 2022-06-15 NOTE — Telephone Encounter (Signed)
pt aware of results  Order placed  New script sent to the pharmacy  Lab orders mailed to the pt

## 2022-06-17 ENCOUNTER — Other Ambulatory Visit: Payer: Self-pay | Admitting: *Deleted

## 2022-06-17 DIAGNOSIS — R931 Abnormal findings on diagnostic imaging of heart and coronary circulation: Secondary | ICD-10-CM

## 2022-06-23 ENCOUNTER — Ambulatory Visit: Payer: Medicare Other

## 2022-07-13 ENCOUNTER — Telehealth: Payer: Self-pay | Admitting: Cardiology

## 2022-07-13 NOTE — Telephone Encounter (Signed)
Called patient, advised that she has not taken her Crestor yesterday or today, it has caused her such bad joint pain, she wishes to switch to something else.   I advised I would reach out to Dr.Hochrein and we would call her back with recommendations.  Patient verbalized understanding.

## 2022-07-13 NOTE — Telephone Encounter (Signed)
Pt c/o medication issue:  1. Name of Medication:   rosuvastatin (CRESTOR) 10 MG tablet    2. How are you currently taking this medication (dosage and times per day)? As written   3. Are you having a reaction (difficulty breathing--STAT)? No   4. What is your medication issue? Pt states she has been having joint pain in her hands and feet since taking this medication. She states she understands this is a common side effect with this med however, it is way more than she can handle on a daily basis. She wants to know what her options are. Please advise.

## 2022-07-14 ENCOUNTER — Other Ambulatory Visit: Payer: Self-pay

## 2022-07-14 DIAGNOSIS — M79672 Pain in left foot: Secondary | ICD-10-CM | POA: Diagnosis not present

## 2022-07-14 MED ORDER — PRAVASTATIN SODIUM 40 MG PO TABS: 40.0000 mg | ORAL_TABLET | Freq: Every evening | ORAL | 3 refills | Status: DC

## 2022-07-14 NOTE — Telephone Encounter (Signed)
Called patient, sent over RX to pharmacy- discontinued Crestor.   Patient aware to wait and repeat blood work in 3 months with switch.   Julie Tapia, just sending so you know about the repeat blood work. I did not reorder since there was lab orders in the system already.  Thanks!

## 2022-07-15 ENCOUNTER — Ambulatory Visit: Payer: Medicare Other | Attending: Internal Medicine

## 2022-07-15 DIAGNOSIS — R931 Abnormal findings on diagnostic imaging of heart and coronary circulation: Secondary | ICD-10-CM | POA: Insufficient documentation

## 2022-07-15 LAB — EXERCISE TOLERANCE TEST
Angina Index: 0
Duke Treadmill Score: 7
Estimated workload: 7.7
Exercise duration (min): 6 min
Exercise duration (sec): 30 s
MPHR: 149 {beats}/min
Peak HR: 155 {beats}/min
Percent HR: 104 %
RPE: 15
Rest HR: 68 {beats}/min
ST Depression (mm): 0 mm

## 2022-07-28 ENCOUNTER — Encounter: Payer: Self-pay | Admitting: Cardiology

## 2022-08-24 DIAGNOSIS — M5451 Vertebrogenic low back pain: Secondary | ICD-10-CM | POA: Diagnosis not present

## 2022-08-24 DIAGNOSIS — M5416 Radiculopathy, lumbar region: Secondary | ICD-10-CM | POA: Diagnosis not present

## 2022-08-24 DIAGNOSIS — M5459 Other low back pain: Secondary | ICD-10-CM | POA: Diagnosis not present

## 2022-09-01 DIAGNOSIS — M47816 Spondylosis without myelopathy or radiculopathy, lumbar region: Secondary | ICD-10-CM | POA: Diagnosis not present

## 2022-09-08 DIAGNOSIS — M5416 Radiculopathy, lumbar region: Secondary | ICD-10-CM | POA: Diagnosis not present

## 2022-09-08 DIAGNOSIS — M5451 Vertebrogenic low back pain: Secondary | ICD-10-CM | POA: Diagnosis not present

## 2022-09-16 DIAGNOSIS — M5416 Radiculopathy, lumbar region: Secondary | ICD-10-CM | POA: Diagnosis not present

## 2022-09-22 DIAGNOSIS — L814 Other melanin hyperpigmentation: Secondary | ICD-10-CM | POA: Diagnosis not present

## 2022-09-22 DIAGNOSIS — D225 Melanocytic nevi of trunk: Secondary | ICD-10-CM | POA: Diagnosis not present

## 2022-09-22 DIAGNOSIS — D485 Neoplasm of uncertain behavior of skin: Secondary | ICD-10-CM | POA: Diagnosis not present

## 2022-09-22 DIAGNOSIS — L820 Inflamed seborrheic keratosis: Secondary | ICD-10-CM | POA: Diagnosis not present

## 2022-09-22 DIAGNOSIS — L578 Other skin changes due to chronic exposure to nonionizing radiation: Secondary | ICD-10-CM | POA: Diagnosis not present

## 2022-09-29 DIAGNOSIS — M5416 Radiculopathy, lumbar region: Secondary | ICD-10-CM | POA: Diagnosis not present

## 2022-09-29 DIAGNOSIS — M5459 Other low back pain: Secondary | ICD-10-CM | POA: Diagnosis not present

## 2022-09-29 DIAGNOSIS — M533 Sacrococcygeal disorders, not elsewhere classified: Secondary | ICD-10-CM | POA: Diagnosis not present

## 2022-09-29 DIAGNOSIS — M5451 Vertebrogenic low back pain: Secondary | ICD-10-CM | POA: Diagnosis not present

## 2022-10-07 DIAGNOSIS — Z5181 Encounter for therapeutic drug level monitoring: Secondary | ICD-10-CM | POA: Diagnosis not present

## 2022-10-07 DIAGNOSIS — G894 Chronic pain syndrome: Secondary | ICD-10-CM | POA: Diagnosis not present

## 2022-10-07 DIAGNOSIS — M48062 Spinal stenosis, lumbar region with neurogenic claudication: Secondary | ICD-10-CM | POA: Diagnosis not present

## 2022-10-07 DIAGNOSIS — M545 Low back pain, unspecified: Secondary | ICD-10-CM | POA: Diagnosis not present

## 2022-10-07 DIAGNOSIS — Z79899 Other long term (current) drug therapy: Secondary | ICD-10-CM | POA: Diagnosis not present

## 2022-10-07 DIAGNOSIS — M7918 Myalgia, other site: Secondary | ICD-10-CM | POA: Diagnosis not present

## 2022-10-07 DIAGNOSIS — M533 Sacrococcygeal disorders, not elsewhere classified: Secondary | ICD-10-CM | POA: Diagnosis not present

## 2022-10-13 ENCOUNTER — Encounter: Payer: Self-pay | Admitting: *Deleted

## 2022-11-12 ENCOUNTER — Ambulatory Visit: Payer: Medicare Other | Admitting: Neurology

## 2022-11-13 ENCOUNTER — Encounter: Payer: Self-pay | Admitting: *Deleted

## 2022-12-07 ENCOUNTER — Ambulatory Visit (INDEPENDENT_AMBULATORY_CARE_PROVIDER_SITE_OTHER): Payer: Medicare Other | Admitting: Neurology

## 2022-12-07 VITALS — BP 128/73 | HR 61 | Ht 68.0 in | Wt 132.8 lb

## 2022-12-07 DIAGNOSIS — E559 Vitamin D deficiency, unspecified: Secondary | ICD-10-CM | POA: Diagnosis not present

## 2022-12-07 DIAGNOSIS — E538 Deficiency of other specified B group vitamins: Secondary | ICD-10-CM | POA: Diagnosis not present

## 2022-12-07 DIAGNOSIS — M6281 Muscle weakness (generalized): Secondary | ICD-10-CM | POA: Diagnosis not present

## 2022-12-07 DIAGNOSIS — E519 Thiamine deficiency, unspecified: Secondary | ICD-10-CM

## 2022-12-07 DIAGNOSIS — G1220 Motor neuron disease, unspecified: Secondary | ICD-10-CM | POA: Diagnosis not present

## 2022-12-07 DIAGNOSIS — M5416 Radiculopathy, lumbar region: Secondary | ICD-10-CM | POA: Diagnosis not present

## 2022-12-07 DIAGNOSIS — R253 Fasciculation: Secondary | ICD-10-CM

## 2022-12-07 DIAGNOSIS — E531 Pyridoxine deficiency: Secondary | ICD-10-CM | POA: Diagnosis not present

## 2022-12-07 NOTE — Progress Notes (Unsigned)
[facility]    Provider:  Dr Lucia Gaskins Requesting Provider: Windle Guard, MD Primary Care Provider:  Alysia Penna, MD  CC:  MRI Lumbar, same (bulging/herniated disc) arthirits in back.  Muscle spasms in legs. Had some steroid inject and helped.   12-17-2022 another steroid injection. Has seen Pain Management.   HPI:  Julie Tapia is a 72 y.o. female here as requested by Windle Guard, MD for diffuse fasciculations. has Cerebral aneurysm and PFO (patent foramen ovale) on their problem list. She is going to [facility] Dr. Drucie Ip and he is an anesthesiologist and he has been providing injections. She states she has not fallen for several years. Her balance is off and she feels weaker. Moving is harder. Her legs are weak. She has had muscle twitches for years. And they are the same in both legs. The twitches can wake her in the night. Her calfs and legs can cramp or become stiff and it is painful she even has to vocalize. In 2020 we did not see fasciculations. She has been having low back pain for years and in [employer] in June she lifted something heavy. She has pain when she goes to bed and she may be pressing on something. The pain is in the low back. She states Dr. Drucie Ip believe she needs an SI injections this month and this past July she had ESI which helped and helped the fasciculations. She was sent here for fasciculations. She states she is confused on the diagnosis and needs a more comprehensive understanding. We reviewed images of dermatomes and myotomes. She has radiating pain down the back of the legs   History 11/2018: Patient is here as at the request of [facility], [facility] for EMG/NCS to evaluate for lumbar radiculopathy. She has chronic low back pain, she has known about a herniated L3 disk since 2008 when she stood up and noticed severe pain but never had surgery; this led to a series of injections( I do not have access to her MRI Lumbar Spine images). The pain  improved after injections. She has polyarthritis and has had injections in the knees as well. At the end of May she had to get an injection for an insect injury, within 2-3 days she started waking up with severe cramping in the right leg and calf and the cramping can be so severe her muscle is sore afterwards. She saw Dr. Andrey Campanile and he did not think this was related to the shot. Leg pain is progressive, she has severe spasms in the right distal leg, tingling in the calf and numbness, to the bottom of the foot. She was very active walking 30+ miles a year and has had to stop due to severe back pain and feels her back is "going to break". Also cramping in the left > right inner thighs. Left thigh symptoms are worse than right thigh symptoms. Right distal leg symptoms are worse than on the left.  Gets very tight right calf pain, muscle gets very hard mostly on the right. She saw Dr. Charlann Boxer for her hip in the past and reports full hip replacement on the right side. 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 Her hip surgery was in 2012.  Limited neurologic exam: left patella(1+) is hyporeflex compared to right patellar(2+). Right AJ(absent) is hyporeflexic compared to left AJ(trace). Weakness left hip flexion 5- with more left proximal symptoms. Right leg flexion 4+/5 and she reports more symptoms distally in  the right leg. Clinical history and focused exam may point to left L3/L4 pathology and right L5/S1 but clinical and radiologic correlation is recommended.   Reviewed notes, labs and imaging from outside physicians, which showed:  MRi lumbar spine: FINDINGS: 08/2022 There are 5 nonrib-bearing lumbar-type vertebrae. The conus medullaris terminates at a normal level. Grade 1 retrolisthesis of L2 on L3. The vertebral body heights are maintained. Moderate/severe L2-L3 intervertebral disc height loss with moderate height loss at L1-L2 and L3-L4. No suspicious marrow signal.  L1-2: Disc bulge  contributes to mild spinal canal stenosis and mild/moderate bilateral neural foraminal stenosis.  L2-3: Disc bulge contributes to mild spinal canal stenosis and along with facet arthrosis, moderate/severe bilateral neural foraminal stenosis.  L3-4: No significant spinal canal stenosis. Facet arthrosis and posterior marginal osteophytosis contributes to severe left and mild/moderate right neural foraminal stenosis.  L4-5: No significant spinal canal stenosis. Facet arthrosis contributes to moderate bilateral neural foraminal stenosis.  L5-S1: No significant spinal canal or neural foraminal stenosis.   Review of Systems: Patient complains of symptoms per HPI as well as the following symptoms LBP, weakness, imbalance, muscle atrophy. Pertinent negatives and positives per HPI. All others negative.  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  EMG needle study did not show acute/ongoing denervation or chronic neurogenic changes to explain her symptoms but clinically appears radicular. Clinical and radiologic correlation recommended.  Fasciculations in the bilateral calfs and thighs did not see any in the arms but will get her in a gown at the emg/ncs. Hip 4/5, leg flexion 4+/5, would think if this is ALS or other muscle disease she would be more progressed, reflexes normal, no upper motor neuron signs however given fasciculations which are now in the arms need thorough evaluation for ALS or other disorder, did not see any in the tongue, facial muscles appear normal no facial weakness, dysophagia reported.   USE numbing spray   MRI cervical spine for neck pain

## 2022-12-07 NOTE — Patient Instructions (Addendum)
[email or web address] Search on "Dermatomes of the lower extremity"  Electromyoneurogram Electromyoneurogram is a test to check how well your muscles and nerves are working. This procedure includes the combined use of electromyogram (EMG) and nerve conduction study (NCS). EMG is used to evaluate muscles and the nerves that control those muscles. NCS, which is also called electroneurogram, measures how well your nerves conduct electricity. The procedures should be done together to check if your muscles and nerves are healthy. If the results of the tests are abnormal, this may indicate disease or injury, such as a neuromuscular disease or peripheral nerve damage. Tell a health care provider about: Any allergies you have. All medicines you are taking, including vitamins, herbs, eye drops, creams, and over-the-counter medicines. Any bleeding problems you have. Any surgeries you have had. Any medical conditions you have. What are the risks? Generally, this is a safe procedure. However, problems may occur, including: Bleeding or bruising. Infection where the electrodes were inserted. What happens before the test? Medicines Take all of your usually prescribed medications before this testing is performed. Do not stop your blood thinners unless advised by your prescribing physician. General instructions Your health care provider may ask you to warm the limb that will be checked with warm water, hot pack, or wrapping the limb in a blanket. Do not use lotions or creams on the same day that you will be having the procedure. What happens during the test? For EMG  Your health care provider will ask you to stay in a position so that the muscle being studied can be accessed. You will be sitting or lying down. You may be given a medicine to numb the area (local anesthetic) and the skin will be disinfected. A very thin needle that has an electrode will be inserted into  your muscle, one muscle at a time. Typically, multiple muscles are evaluated during a single study. Another small electrode will be placed on your skin near the muscle. Your health care provider will ask you to continue to remain still. The electrodes will record the electrical activity of your muscles. You may see this on a monitor or hear it in the room. After your muscles have been studied at rest, your health care provider will ask you to contract or flex your muscles. The electrodes will record the electrical activity of your muscles. Your health care provider will remove the electrodes and the electrode needle when the procedure is finished. The procedure may vary among health care providers and hospitals. For NCS  An electrode that records your nerve activity (recording electrode) will be placed on your skin by the muscle that is being studied. An electrode that is used as a reference (reference electrode) will be placed near the recording electrode. A paste or gel will be applied to your skin between the recording electrode and the reference electrode. Your nerve will be stimulated with a mild shock. The speed of the nerves and strength of response is recorded by the electrodes. Your health care provider will remove the electrodes and the gel when the procedure is finished. The procedure may vary among health care providers and hospitals. What can I expect after the test? It is up to you to get your test results. Ask your health care provider, or the department that is doing the test, when your results will be ready. Your health care provider may: Give you medicines for any pain. Monitor the insertion sites to make sure that bleeding stops. You should be able  to drive yourself to and from the test. Discomfort can persist for a few hours after the test, but should be better the next day. Contact a health care provider if: You have swelling, redness, or drainage at any of the insertion  sites. Summary Electromyoneurogram is a test to check how well your muscles and nerves are working. If the results of the tests are abnormal, this may indicate disease or injury. This is a safe procedure. However, problems may occur, such as bleeding and infection. Your health care provider will do two tests to complete this procedure. One checks your muscles (EMG) and another checks your nerves (NCS). It is up to you to get your test results. Ask your health care provider, or the department that is doing the test, when your results will be ready. This information is not intended to replace advice given to you by your health care provider. Make sure you discuss any questions you have with your health care provider. Document Revised: 10/30/2020 Document Reviewed: 09/29/2020 Elsevier Patient Education  2024 [employer].

## 2022-12-08 ENCOUNTER — Encounter: Payer: Self-pay | Admitting: Neurology

## 2022-12-09 ENCOUNTER — Telehealth: Payer: Self-pay | Admitting: Neurology

## 2022-12-09 NOTE — Telephone Encounter (Signed)
medicare/[employer] NPR sent to [facility] [phone] 

## 2022-12-16 ENCOUNTER — Other Ambulatory Visit: Payer: Self-pay | Admitting: Neurology

## 2022-12-16 DIAGNOSIS — R768 Other specified abnormal immunological findings in serum: Secondary | ICD-10-CM

## 2022-12-17 ENCOUNTER — Ambulatory Visit (INDEPENDENT_AMBULATORY_CARE_PROVIDER_SITE_OTHER): Payer: Medicare Other | Admitting: Neurology

## 2022-12-17 ENCOUNTER — Telehealth: Payer: Self-pay | Admitting: Neurology

## 2022-12-17 ENCOUNTER — Ambulatory Visit (INDEPENDENT_AMBULATORY_CARE_PROVIDER_SITE_OTHER): Payer: Self-pay | Admitting: Neurology

## 2022-12-17 DIAGNOSIS — Z0289 Encounter for other administrative examinations: Secondary | ICD-10-CM

## 2022-12-17 DIAGNOSIS — M6281 Muscle weakness (generalized): Secondary | ICD-10-CM

## 2022-12-17 DIAGNOSIS — R253 Fasciculation: Secondary | ICD-10-CM

## 2022-12-17 DIAGNOSIS — R768 Other specified abnormal immunological findings in serum: Secondary | ICD-10-CM | POA: Diagnosis not present

## 2022-12-17 DIAGNOSIS — M5416 Radiculopathy, lumbar region: Secondary | ICD-10-CM

## 2022-12-17 DIAGNOSIS — G1220 Motor neuron disease, unspecified: Secondary | ICD-10-CM

## 2022-12-17 DIAGNOSIS — M533 Sacrococcygeal disorders, not elsewhere classified: Secondary | ICD-10-CM | POA: Diagnosis not present

## 2022-12-17 NOTE — Telephone Encounter (Signed)
[facility] was told that Dr. Lucia Gaskins would get her in sooner to go over results and she was a little anxious and wanted to make sure it would be taken care of.

## 2022-12-17 NOTE — Telephone Encounter (Signed)
Which test result? MRI isn't done yet.

## 2022-12-19 LAB — ANTINUCLEAR ANTIBODIES, IFA: ANA Titer 1: NEGATIVE

## 2022-12-20 NOTE — Progress Notes (Unsigned)
Full Name: Julie Tapia Gender: Female MRN #: [id number] Date of Birth: December 19, 1950    Visit Date: 12/17/2022 10:07 Age: 72 Years Examining Physician: Dr. Naomie Dean Referring Physician: Dr. Naomie Dean Height: 5 feet 8 inch  History:  72 y.o. female here as requested by Windle Guard, MD for fasciculations. [facility] is going to [facility] Dr. Drucie Ip and he is an anesthesiologist and he has been providing injections. She states she has not fallen for several years.  She has had muscle twitches for years without chronic progression of weakness. She has been having low back pain for years and in [employer] in June she lifted something heavy which acutely worsened her symptoms; Had lumbar ESI which helped the fasciculations. She was sent here for evaluation of fasciculations.     Summary: NCS was performed on the bilateral lower extremities and left upper extremity. EMG was performed on the left upper and left lower extremities and one right lower extremity muscle where fasciculation was seen at the time of testing. The left peroneal motor nerve (EDB) showed no response.  The left peroneal (tibialis anterior) showed reduced amplitude (1.3 mV, normal greater than 3).  The left peroneal sensory nerve showed no response.The left peroneus longus muscle, left extensor hallucis longus muscle and left tibialis posterior muscle showed prolonged motor unit duration, polyphasic motor units and diminished motor unit recruitment.  The left gastrocnemius muscle showed polyphasic motor units and diminished motor unit recruitment.  The right lateral gastrocnemius showed a few fasciculations.All remaining nerves and all remaining muscles (as indicated in the following tables) were within normal limits.     Conclusion: Nerve conduction studies of the right leg were within normal limits.  Nerve conduction studies of the left leg showed abnormal peroneal motor and sensory conductions (but normal tibial motor and  sural sensory conductions).  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  No evidence for motor neuron disease or any acute/ongoing findings; fibrillations ongoing for years without other corresponding symptoms very likely to be benign or from prior radicular nerve damage.  ------------------------------- Naomie Dean, M.D.  [facility] [address] [place], [place] [address] Tel: [phone] Fax: [phone]  Verbal informed consent was obtained from the patient, patient was informed of potential risk of procedure, including bruising, bleeding, hematoma formation, infection, muscle weakness, muscle pain, numbness, among others.        MNC    Nerve / Sites Muscle Latency Ref. Amplitude Ref. Rel Amp Segments Distance Velocity Ref. Area    ms ms mV mV %  cm m/s m/s mVms  L Median - APB     Wrist APB 3.8 <=4.4 6.3 >=4.0 100 Wrist - APB 7   19.6     Upper arm APB 9.7  8.4  133 Upper arm - Wrist 32 54 >=49 24.9  L Ulnar - ADM     Wrist ADM 3.1 <=3.3 10.8 >=6.0 100 Wrist - ADM 7   34.0     B.Elbow ADM 5.2  8.9  82.4 B.Elbow - Wrist 14 65 >=49 30.7     A.Elbow ADM 8.1  8.4  94 A.Elbow - B.Elbow 16 56 >=49 29.7  L Peroneal - EDB     Ankle EDB NR <=6.5 NR >=2.0 NR Ankle -  EDB 9   NR         Pop fossa - Ankle      R Peroneal - EDB     Ankle EDB 5.2 <=6.5 2.3 >=2.0 100 Ankle - EDB 9   6.3     Fib head EDB 11.7  1.6  69.3 Fib head - Ankle 27 44 >=44 5.1     Pop fossa EDB 14.3  1.6  102 Pop fossa - Fib head 13 51 >=44 4.4         Pop fossa - Ankle      L Tibial - AH     Ankle AH 5.2 <=5.8 5.3 >=4.0 100 Ankle - AH 9   15.2     Pop fossa AH 13.6  4.0  75.1 Pop fossa - Ankle 34.6 42 >=41 13.5  R Tibial - AH     Ankle AH 5.4 <=5.8 4.1 >=4.0 100  Ankle - AH 9   11.7     Pop fossa AH 14.4  4.0  97.2 Pop fossa - Ankle 38 43 >=41 10.8  L Peroneal - Tib Ant     Fib Head Tib Ant 3.0 <=4.7 1.0 >=3.0 100 Fib Head - Tib Ant 10   1.5     Pop fossa Tib Ant 5.6  1.1  113 Pop fossa - Fib Head 13 52 >=44 1.8  R Peroneal - Tib Ant     Fib Head Tib Ant 2.5 <=4.7 1.4 >=3.0 100 Fib Head - Tib Ant 10   1.6     Pop fossa Tib Ant 4.4  0.9  63.8 Pop fossa - Fib Head 12 62 >=44 0.8                     SNC    Nerve / Sites Rec. Site Peak Lat Ref.  Amp Ref. Segments Distance Peak Diff Ref.    ms ms V V  cm ms ms  L Sural - Ankle (Calf)     Calf Ankle 4.4 <=4.4 10 >=3(adjusted for age) Calf - Ankle 14    R Sural - Ankle (Calf)     Calf Ankle 4.4 <=4.4 8 >=3(adjusted for age) Calf - Ankle 14    L Superficial peroneal - Ankle     Lat leg Ankle NR <=4.4 NR >=3(adjusted for age) Lat leg - Ankle 14    R Superficial peroneal - Ankle     Lat leg Ankle 2.4 <=4.4 3 >=3(adjusted for age) Lat leg - Ankle 14    L Median, Ulnar - Transcarpal comparison     Median Palm Wrist 2.2 <=2.2 50 >=35 Median Palm - Wrist 8       Ulnar Palm Wrist 2.2 <=2.2 25 >=12 Ulnar Palm - Wrist 8          Median Palm - Ulnar Palm  0.0 <=0.4  L Median - Orthodromic (Dig II, Mid palm)     Dig II Wrist 3.4 <=3.4 14 >=10 Dig II - Wrist 13    L Ulnar - Orthodromic, (Dig V, Mid palm)     Dig V Wrist 3.1 <=3.1 13 >=5 Dig V - Wrist 4                     F  Wave    Nerve F Lat Ref.   ms ms  L Tibial - AH 52.1 <=56.0  R Tibial - AH 53.0 <=56.0  L Ulnar -  ADM 28.2 <=32.0           EMG Summary Table    Spontaneous MUAP Recruitment  Muscle IA Fib PSW Fasc Other Amp Dur. Poly Pattern  L. Deltoid Normal None None None _______ Normal Normal Normal Normal  L. Trapezius Normal None None None _______ Normal Normal Normal Normal  L. Biceps brachii Normal None None None _______ Normal Normal Normal Normal  L. Pronator teres Normal None None None _______ Normal Normal Normal Normal  L. First  dorsal interosseous Normal None None None _______ Normal Normal Normal Normal  L. Iliopsoas Normal None None None _______ Normal Normal Normal Normal  L. Vastus medialis Normal None None None _______ Normal Normal Normal Normal  L. Tibialis anterior Normal None None None _______ Normal Normal Normal Normal  L. Gastrocnemius (Medial head) Normal None None None _______ Normal Normal 2+ Reduced  L. Extensor hallucis longus Normal None None None _______ Normal Increased 2+ Reduced  L. Biceps femoris (long head) Normal None None None _______ Normal Normal Normal Normal  L. Gluteus maximus Normal None None None _______ Normal Normal Normal Normal  L. Gluteus medius Normal None None None _______ Normal Normal Normal Normal  L. Cervical paraspinals (low) Normal None None None _______ Normal Normal Normal Normal  L. Lumbar paraspinals (low) Normal None None None _______ Normal Normal Normal Normal  L. Peroneus longus Normal None None None _______ Normal Increased 2+ Reduced  L. Tibialis posterior Normal None None None _______ Normal Increased 2+ Reduced  R Gastrocnemius (Lateral head) Normal None   None   +1  Normal Normal Normal Normal

## 2022-12-21 ENCOUNTER — Encounter: Payer: Self-pay | Admitting: Neurology

## 2022-12-21 NOTE — Telephone Encounter (Signed)
You all can open up my December 20th and put this patient on at 10:45 and block my 11:15 so I have extra time with her

## 2022-12-22 NOTE — Telephone Encounter (Signed)
Pt has been rescheduled for 02/19/23 at 10:45am and 11:15 slot has been blocked

## 2022-12-23 ENCOUNTER — Ambulatory Visit: Payer: Medicare Other | Admitting: Neurology

## 2022-12-31 LAB — COMPREHENSIVE METABOLIC PANEL
ALT: 26 [IU]/L (ref 0–32)
AST: 27 [IU]/L (ref 0–40)
Albumin: 4.6 g/dL (ref 3.8–4.8)
Alkaline Phosphatase: 103 [IU]/L (ref 44–121)
BUN/Creatinine Ratio: 14 (ref 12–28)
BUN: 10 mg/dL (ref 8–27)
Bilirubin Total: 0.3 mg/dL (ref 0.0–1.2)
CO2: 25 mmol/L (ref 20–29)
Calcium: 9.7 mg/dL (ref 8.7–10.3)
Chloride: 103 mmol/L (ref 96–106)
Creatinine, Ser: 0.7 mg/dL (ref 0.57–1.00)
Globulin, Total: 2.6 g/dL (ref 1.5–4.5)
Glucose: 98 mg/dL (ref 70–99)
Potassium: 4.8 mmol/L (ref 3.5–5.2)
Sodium: 142 mmol/L (ref 134–144)
Total Protein: 7.2 g/dL (ref 6.0–8.5)
eGFR: 92 mL/min/{1.73_m2} (ref >59)

## 2022-12-31 LAB — MULTIPLE MYELOMA PANEL, SERUM
Albumin SerPl Elph-Mcnc: 4.3 g/dL (ref 2.9–4.4)
Albumin/Glob SerPl: 1.5 (ref 0.7–1.7)
Alpha 1: 0.2 g/dL (ref 0.0–0.4)
Alpha2 Glob SerPl Elph-Mcnc: 0.8 g/dL (ref 0.4–1.0)
B-Globulin SerPl Elph-Mcnc: 1 g/dL (ref 0.7–1.3)
Gamma Glob SerPl Elph-Mcnc: 0.9 g/dL (ref 0.4–1.8)
Globulin, Total: 2.9 g/dL (ref 2.2–3.9)
IgA/Immunoglobulin A, Serum: 186 mg/dL (ref 64–422)
IgG (Immunoglobin G), Serum: 928 mg/dL (ref 586–1602)
IgM (Immunoglobulin M), Srm: 115 mg/dL (ref 26–217)

## 2022-12-31 LAB — CBC WITH DIFFERENTIAL/PLATELET
Basophils Absolute: 0 10*3/uL (ref 0.0–0.2)
Basos: 1 %
EOS (ABSOLUTE): 0.2 10*3/uL (ref 0.0–0.4)
Eos: 3 %
Hematocrit: 39.6 % (ref 34.0–46.6)
Hemoglobin: 12.9 g/dL (ref 11.1–15.9)
Immature Grans (Abs): 0 10*3/uL (ref 0.0–0.1)
Immature Granulocytes: 0 %
Lymphocytes Absolute: 1.4 10*3/uL (ref 0.7–3.1)
Lymphs: 21 %
MCH: 32.8 pg (ref 26.6–33.0)
MCHC: 32.6 g/dL (ref 31.5–35.7)
MCV: 101 fL — ABNORMAL HIGH (ref 79–97)
Monocytes Absolute: 0.4 10*3/uL (ref 0.1–0.9)
Monocytes: 5 %
Neutrophils Absolute: 4.8 10*3/uL (ref 1.4–7.0)
Neutrophils: 70 %
Platelets: 308 10*3/uL (ref 150–450)
RBC: 3.93 x10E6/uL (ref 3.77–5.28)
RDW: 12.7 % (ref 11.7–15.4)
WBC: 6.8 10*3/uL (ref 3.4–10.8)

## 2022-12-31 LAB — ANGIOTENSIN CONVERTING ENZYME: Angio Convert Enzyme: 61 U/L (ref 14–82)

## 2022-12-31 LAB — TSH RFX ON ABNORMAL TO FREE T4: TSH: 1.65 u[IU]/mL (ref 0.450–4.500)

## 2022-12-31 LAB — ANA W/REFLEX: Anti Nuclear Antibody (ANA): POSITIVE — AB

## 2022-12-31 LAB — ENA+DNA/DS+SJORGEN'S
ENA RNP Ab: 2.2 AI — ABNORMAL HIGH (ref 0.0–0.9)
ENA SM Ab Ser-aCnc: <0.2 AI (ref 0.0–0.9)
ENA SSA (RO) Ab: 0.4 AI (ref 0.0–0.9)
ENA SSB (LA) Ab: <0.2 AI (ref 0.0–0.9)
dsDNA Ab: <1 [IU]/mL (ref 0–9)

## 2022-12-31 LAB — ACETYLCHOLINE RECEPTOR, BINDING: AChR Binding Ab, Serum: <0.03 nmol/L (ref 0.00–0.24)

## 2022-12-31 LAB — VITAMIN B6: Vitamin B6: 46.5 ug/L (ref 3.4–65.2)

## 2022-12-31 LAB — B12 AND FOLATE PANEL
Folate: >20 ng/mL (ref >3.0)
Vitamin B-12: 1154 pg/mL (ref 232–1245)

## 2022-12-31 LAB — SEDIMENTATION RATE: Sed Rate: 5 mm/h (ref 0–40)

## 2022-12-31 LAB — METHYLMALONIC ACID, SERUM: Methylmalonic Acid: 199 nmol/L (ref 0–378)

## 2022-12-31 LAB — ANCA PROFILE
Anti-MPO Antibodies: <0.2 U (ref 0.0–0.9)
Anti-PR3 Antibodies: <0.2 U (ref 0.0–0.9)
Atypical pANCA: <1:20 {titer}
C-ANCA: <1:20 {titer}
P-ANCA: <1:20 {titer}

## 2022-12-31 LAB — C-REACTIVE PROTEIN: CRP: <1 mg/L (ref 0–10)

## 2022-12-31 LAB — HEAVY METALS, BLOOD
Arsenic: 8 ug/L (ref 0–9)
Lead, Blood: 1.2 ug/dL (ref 0.0–3.4)
Mercury: 1.2 ug/L (ref 0.0–14.9)

## 2022-12-31 LAB — MAGNESIUM: Magnesium: 2 mg/dL (ref 1.6–2.3)

## 2022-12-31 LAB — VGCC ANTIBODY: VGCC Antibody: <1 pmol/L (ref 0.0–30.0)

## 2022-12-31 LAB — VITAMIN B1: Thiamine: 170.1 nmol/L (ref 66.5–200.0)

## 2022-12-31 LAB — CK: Total CK: 160 U/L (ref 32–182)

## 2022-12-31 LAB — GLIADIN ANTIBODIES, SERUM
Antigliadin Abs, IgA: 5 U (ref 0–19)
Gliadin IgG: 4 U (ref 0–19)

## 2022-12-31 LAB — ACETYLCHOLINE RECEPTOR, BLOCKING: Acetylchol Block Ab: 13 % (ref 0–25)

## 2022-12-31 LAB — COPPER, SERUM: Copper: 110 ug/dL (ref 80–158)

## 2022-12-31 LAB — THYROID PEROXIDASE ANTIBODY: Thyroperoxidase Ab SerPl-aCnc: <9 [IU]/mL (ref 0–34)

## 2022-12-31 LAB — VITAMIN D 25 HYDROXY (VIT D DEFICIENCY, FRACTURES): Vit D, 25-Hydroxy: 41.6 ng/mL (ref 30.0–100.0)

## 2022-12-31 LAB — THYROGLOBULIN ANTIBODY: Thyroglobulin Antibody: <1 [IU]/mL (ref 0.0–0.9)

## 2023-01-08 DIAGNOSIS — M5416 Radiculopathy, lumbar region: Secondary | ICD-10-CM | POA: Diagnosis not present

## 2023-01-08 DIAGNOSIS — M533 Sacrococcygeal disorders, not elsewhere classified: Secondary | ICD-10-CM | POA: Diagnosis not present

## 2023-01-08 DIAGNOSIS — M5451 Vertebrogenic low back pain: Secondary | ICD-10-CM | POA: Diagnosis not present

## 2023-01-08 DIAGNOSIS — M5459 Other low back pain: Secondary | ICD-10-CM | POA: Diagnosis not present

## 2023-01-25 DIAGNOSIS — Z23 Encounter for immunization: Secondary | ICD-10-CM | POA: Diagnosis not present

## 2023-02-19 ENCOUNTER — Encounter: Payer: Self-pay | Admitting: Neurology

## 2023-02-19 ENCOUNTER — Ambulatory Visit (INDEPENDENT_AMBULATORY_CARE_PROVIDER_SITE_OTHER): Payer: Medicare Other | Admitting: Neurology

## 2023-02-19 VITALS — BP 133/80 | HR 84 | Ht 67.0 in | Wt 132.8 lb

## 2023-02-19 DIAGNOSIS — R253 Fasciculation: Secondary | ICD-10-CM | POA: Diagnosis not present

## 2023-02-19 DIAGNOSIS — G1229 Other motor neuron disease: Secondary | ICD-10-CM

## 2023-02-19 DIAGNOSIS — G1220 Motor neuron disease, unspecified: Secondary | ICD-10-CM

## 2023-02-19 DIAGNOSIS — M6250 Muscle wasting and atrophy, not elsewhere classified, unspecified site: Secondary | ICD-10-CM

## 2023-02-19 DIAGNOSIS — R768 Other specified abnormal immunological findings in serum: Secondary | ICD-10-CM

## 2023-02-19 DIAGNOSIS — M6281 Muscle weakness (generalized): Secondary | ICD-10-CM

## 2023-02-19 MED ORDER — GABAPENTIN 100 MG PO CAPS: 100.0000 mg | ORAL_CAPSULE | Freq: Every day | ORAL | 3 refills | Status: DC

## 2023-02-19 NOTE — Progress Notes (Unsigned)
[id number]    Provider:  Dr Lucia Gaskins Requesting Provider: Windle Guard, MD Primary Care Provider:  Alysia Penna, MD  CC:  MRI Lumbar, same (bulging/herniated disc) arthirits in back.  Muscle spasms in legs. Had some steroid inject and helped.   12-17-2022 another steroid injection. Has seen Pain Management.   Here with daughter. We review prior workup. We discussed she has chronic pain. She can have injections, then facet injections or other procedures, spinal cord stimulator. She is scheduled for water therapy at [facility]. Extended visit. We discussed her fasciculations, her emg/ncs did not show neuromuscular disorder, extensive blood testing was negative. Monitoring caffeine, treating anxiety, using medications like ca-channel blockers or aeds like gabapentin or propranolol. Discussed medications. Discussed channelopathies and other genetic disorders,pros and cons of genetic testing  Should be lifting weights, scared to hurt her back. Personal trainer.   Reviewed images personally with patient and daughter: MRI Lumbar spine 09/01/2022: MRI lumbar spine:  INDICATION: Back pain.  TECHNIQUE: Sagittal and axial T1 and T2-weighted sequences were performed. Additional sagittal STIR images were performed.  COMPARISON: None available at time of dictation.  FINDINGS:  There are 5 nonrib-bearing lumbar-type vertebrae. The conus medullaris terminates at a normal level. Grade 1 retrolisthesis of L2 on L3. The vertebral body heights are maintained. Moderate/severe L2-L3 intervertebral disc height loss with moderate height loss at L1-L2 and L3-L4. No suspicious marrow signal.  L1-2: Disc bulge contributes to mild spinal canal stenosis and mild/moderate bilateral neural foraminal stenosis.  L2-3: Disc bulge contributes to mild spinal canal stenosis and along with facet arthrosis, moderate/severe bilateral neural foraminal stenosis.  L3-4: No significant spinal canal stenosis.  Facet arthrosis and posterior marginal osteophytosis contributes to severe left and mild/moderate right neural foraminal stenosis.  L4-5: No significant spinal canal stenosis. Facet arthrosis contributes to moderate bilateral neural foraminal stenosis.  L5-S1: No significant spinal canal or neural foraminal stenosis.  Reviewed my prior EMG nerve conduction study with patient and daughter: EMG/NCS was performed 12/07/2022: Conclusion: Nerve conduction studies of the right leg were within normal limits.  Nerve conduction studies of the left leg showed abnormal peroneal motor and sensory conductions (but normal tibial motor and sural sensory conductions).  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  No evidence for motor neuron disease or any acute/ongoing findings; fibrillations ongoing for years without other corresponding symptoms very likely to be benign or from prior radicular nerve damage.  Patient complains of symptoms per HPI as well as the following symptoms: None. Pertinent negatives and positives per HPI. All others negative   HPI 12/07/2022:  Julie Tapia is a 72 y.o. female here as requested by Windle Guard, MD for diffuse fasciculations. has Cerebral aneurysm and PFO (patent foramen ovale) on their problem list. [facility] is going to [facility] Dr. Drucie Ip and he is an anesthesiologist and he has been providing injections. She states she has not fallen for several years. Her balance is off and she feels weaker. Moving is harder. Her legs are weak. She has had muscle twitches for years. And they are the same in both legs. The twitches can wake her in the night. Her calfs and legs can cramp or become stiff and it is painful she even has to vocalize. In 2020 we did not see  fasciculations. She  has been having low back pain for years and in [employer] in June she lifted something heavy. She has pain when she goes to bed and she may be pressing on something. The pain is in the low back. She states Dr. Drucie Ip believe she needs an SI injections this month and this past July she had ESI which helped and helped the fasciculations. She was sent here for fasciculations. She states she is confused on the diagnosis and needs a more comprehensive understanding. We reviewed images of dermatomes and myotomes on detail. We reviewed mri of the lumbar spine in detail. Answered many questions She has radiating pain down the back of the legs,  (bulging/herniated disc) arthirits in back.  Muscle spasms in legs. Had some steroid inject and helped.   12-17-2022 another steroid injection. Has seen Pain Management. No other focal neurologic deficits, associated symptoms, inciting events or modifiable factors.  Reviewed notes, labs and imaging from outside physicians, which showed:  MRi lumbar spine: FINDINGS: 08/2022 There are 5 nonrib-bearing lumbar-type vertebrae. The conus medullaris terminates at a normal level. Grade 1 retrolisthesis of L2 on L3. The vertebral body heights are maintained. Moderate/severe L2-L3 intervertebral disc height loss with moderate height loss at L1-L2 and L3-L4. No suspicious marrow signal.  L1-2: Disc bulge contributes to mild spinal canal stenosis and mild/moderate bilateral neural foraminal stenosis.  L2-3: Disc bulge contributes to mild spinal canal stenosis and along with facet arthrosis, moderate/severe bilateral neural foraminal stenosis.  L3-4: No significant spinal canal stenosis. Facet arthrosis and posterior marginal osteophytosis contributes to severe left and mild/moderate right neural foraminal stenosis.  L4-5: No significant spinal canal stenosis. Facet arthrosis contributes to moderate bilateral neural foraminal stenosis.  L5-S1: No significant spinal canal  or neural foraminal stenosis.   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  EMG needle study did not show acute/ongoing denervation or chronic neurogenic changes to explain her symptoms but clinically appears radicular. Clinical and radiologic correlation recommended. Review of Systems: Patient complains of symptoms per HPI as well as the following symptoms LBP, weakness, imbalance, muscle atrophy. Pertinent negatives and positives per HPI. All others negative.   History 11/2018 reviewed: Patient is here as at the request of Glynis Smiles, PA for EMG/NCS to evaluate for lumbar radiculopathy. She has chronic low back pain, she has known about a herniated L3 disk since 2008 when she stood up and noticed severe pain but never had surgery; this led to a series of injections( I do not have access to her MRI Lumbar Spine images). The pain improved after injections. She has polyarthritis and has had injections in the knees as well. At the end of May she had to get an injection for an insect injury, within 2-3 days she started waking up with severe cramping in the right leg and calf and the cramping can be so severe her muscle is sore afterwards. She saw Dr. Andrey Campanile and he did not think this was related to the shot. Leg pain is progressive, she has severe spasms in the right distal leg, tingling in the calf and numbness, to the bottom of the foot. She was very active walking 30+ miles a year and has had to stop due to severe back pain and feels her back is "going to break". Also cramping in the left > right inner thighs. Left thigh  symptoms are worse than right thigh symptoms. Right distal leg symptoms are worse than on the left.  Gets very tight right calf pain, muscle gets very hard mostly on the right. She saw Dr.  Charlann Boxer for her hip in the past and reports full hip replacement on the right side. 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 Her hip surgery was in 2012.  Limited neurologic exam: left patella(1+) is hyporeflex compared to right patellar(2+). Right AJ(absent) is hyporeflexic compared to left AJ(trace). Weakness left hip flexion 5- with more left proximal symptoms. Right leg flexion 4+/5 and she reports more symptoms distally in the right leg. Clinical history and focused exam may point to left L3/L4 pathology and right L5/S1 but clinical and radiologic correlation is recommended.    Social History   Socioeconomic History   Marital status: Single    Spouse name: Not on file   Number of children: Not on file   Years of education: Not on file   Highest education level: Not on file  Occupational History   Not on file  Tobacco Use   Smoking status: Former    Current packs/day: 0.00    Types: Cigarettes    Quit date: 01/16/1984    Years since quitting: 39.1   Smokeless tobacco: Never  Vaping Use   Vaping status: Never Used  Substance and Sexual Activity   Alcohol use: Yes    Alcohol/week: 1.0 standard drink of alcohol    Types: 1 Glasses of wine per week   Drug use: No   Sexual activity: Never  Other Topics Concern   Not on file  Social History Narrative   Lives alone.  One daughter.     Social Drivers of Corporate investment banker Strain: Not on file  Food Insecurity: Not on file  Transportation Needs: Not on file  Physical Activity: Not on file  Stress: Not on file  Social Connections: Unknown (08/27/2022)   Received from [facility], [facility]   Social Network    Social Network: Not on file  Intimate Partner Violence: Unknown (08/27/2022)   Received from [facility], [facility]   HITS    Physically Hurt: Not on file    Insult or Talk Down To: Not on file    Threaten Physical Harm: Not on file    Scream or Curse: Not on file    Family  History  Problem Relation Age of Onset   Heart disease Mother        Pacemaker    Past Medical History:  Diagnosis Date   Aneurysm (HCC) 01-16-11   "lt .side of head not intra(cavity area)/Found during series of tests to r/o TIA's"-report from [facility] showed  Lt. ICA  aneurysm   Arthritis 01-16-11   osteoarthritis,-knees, hips, ankles. 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 tight chested-"no inhalers in yrs.   Complication of anesthesia 01-16-11   Pt. very slow to awaken and was days before feeling like herself.    Patient Active Problem List   Diagnosis Date Noted   Cerebral aneurysm 01/12/2021   PFO (patent foramen ovale) 01/12/2021    Past Surgical History:  Procedure Laterality Date   ABDOMINAL HYSTERECTOMY     BREAST CYST EXCISION  01-16-11   several excisions in the past   JOINT REPLACEMENT  01-16-11   Rt. great toe ball joint replacement-metal  REDUCTION MAMMAPLASTY  01-16-11   Bil. '86( problems with s/p surgical clot at rt. breast drainage site)   TOTAL HIP ARTHROPLASTY  01/20/2011   Procedure: TOTAL HIP ARTHROPLASTY ANTERIOR APPROACH;  Surgeon: Shelda Pal;  Location: [facility];  Service: Orthopedics;  Laterality: Right;    Current Outpatient Medications  Medication Sig Dispense Refill   albuterol (VENTOLIN HFA) 108 (90 Base) MCG/ACT inhaler albuterol sulfate HFA 90 mcg/actuation aerosol inhaler  INL 2 PFS PO Q 6 H PRN FOR WHZ     aspirin 325 MG tablet Take 325 mg daily by mouth.     cholecalciferol (VITAMIN D) 1000 UNITS tablet Take 1,000 Units by mouth daily.       diclofenac Sodium (VOLTAREN) 1 % GEL Apply 2 g topically daily.     diphenhydrAMINE (BENADRYL) 25 mg capsule Take 25 mg by mouth every 6 (six) hours as needed. Allergies       Homeopathic Products (ARNICARE) GEL Apply 1 application topically 4 (four) times daily as needed. pain      Multiple Vitamins-Minerals (MULTIVITAMINS  THER. W/MINERALS) TABS Take 1 tablet by mouth daily.       OVER THE COUNTER MEDICATION [employer] foam for back pain, uses prn     triamcinolone ointment (KENALOG) 0.5 % Apply to affected area twice daily     EPINEPHrine (EPIPEN JR) 0.15 MG/0.3ML injection Inject 0.15 mg into the muscle as needed. Allergy reaction  (Patient not taking: Reported on 02/19/2023)     pravastatin (PRAVACHOL) 40 MG tablet Take 1 tablet (40 mg total) by mouth every evening. 90 tablet 3   No current facility-administered medications for this visit.    Allergies as of 02/19/2023 - Review Complete 02/19/2023  Allergen Reaction Noted   Erythromycin Anaphylaxis 01/08/2011   Ciprofloxacin hcl Itching and Swelling 01/08/2011   Contrast media [iodinated contrast media] Itching and Swelling 01/08/2011   Lidocaine Itching and Other (See Comments) 01/08/2011   Penicillins Other (See Comments) 01/08/2011   Sulfa drugs cross reactors Itching, Swelling, and Other (See Comments) 01/08/2011    Vitals: BP 133/80 (BP Location: Left Arm, Patient Position: Sitting, Cuff Size: Normal)   Pulse 84   Ht 5\' 7"  (1.702 m)   Wt 132 lb 12.8 oz (60.2 kg)   BMI 20.80 kg/m  Last Weight:  Wt Readings from Last 1 Encounters:  02/19/23 132 lb 12.8 oz (60.2 kg)   Last Height:   Ht Readings from Last 1 Encounters:  02/19/23 5\' 7"  (1.702 m)    Physical exam: Exam: Gen: NAD, conversant      CV: No palpitations or chest pain or SOB. VS: Breathing at a normal rate. Weight normal. Not febrile. Eyes: Conjunctivae clear without exudates or hemorrhage  Neuro: Detailed Neurologic Exam  Speech:    Speech is normal; fluent and spontaneous with normal comprehension.  Cognition:    The patient is oriented to person, place, and time;     recent and remote memory intact;     language fluent;     normal attention, concentration, fund of knowledge Cranial Nerves:    The pupils are equal, round, and reactive to light. Visual fields are full  Extraocular movements are intact.  The face is symmetric with normal sensation. The palate elevates in the midline. Hearing intact. Voice is normal. Shoulder shrug is normal. The tongue has normal motion without fasciculations.   Coordination: normal  Gait:    No abnormalities noted or reported  Motor Observation:   no involuntary  movements noted. Tone:    normal  Posture:    Posture is normal. normal erect    Strength:    Strength is anti-gravity and symmetric in the upper and lower limbs.      Sensation: intact to LT, no reports of numbness or tingling or paresthesias         Assessment/Plan:   72 y.o. female here as requested by Windle Guard, MD for diffuse fasciculations. has Cerebral aneurysm and PFO (patent foramen ovale) on their problem list. [facility] is going to [facility] Dr. Drucie Ip and he is an anesthesiologist and he has been providing injections. She states she has not fallen for several years. Her balance is off and she feels weaker. Moving is harder. Her legs are weak. She has had muscle twitches for years. And they are the same in both legs. The twitches can wake her in the night. Her calfs and legs can cramp or become stiff and it is painful she even has to vocalize. In 2020 we did not see fasciculations. She has been having low back pain for years and in [employer] in [place] she lifted something heavy. Worsened her symptoms since then with back pain; She states Dr. Drucie Ip believes she needs SI injections this month and this past July she had lumbar ESI which helped and helped the fasciculations. She was sent here for evaluation of fasciculations. She states she is confused on the diagnosis and needs a more comprehensive understanding. Extended visit.  Extended visit today with patient and daughter:Here with daughter. We review prior workup. We discussed she has chronic pain. She can have injections, then facet injections or other procedures, spinal cord stimulator. She is  scheduled for water therapy at [facility]. Extended visit. We discussed her fasciculations, her emg/ncs did not show neuromuscular disorder, extensive blood testing was negative. Monitoring caffeine, treating anxiety, using medications like ca-channel blockers or aeds like gabapentin or propranolol. Discussed medications. Discussed channelopathies and other genetic disorders,pros and cons of genetic testing  Cramps: Magnesium doses and other OTC medications you can try: 200 mg mag citrate 1-2x daily 400 mg mag oxide 1-2x daily Mag sulfate 600 mg daily Magnesium glycinate 200-400mg  at bedtime Find a spot in 8 weeks  Tonic water with quinine, banana, magnesium as above  Should be lifting weights, scared to hurt her back. Personal trainer.   At last visit and at this visit with daughter repeated the following: - We reviewed images of dermatomes and myotomes in detail.  - We reviewed mri  images of the lumbar spine and cervical spine.  As well as EMG nerve conduction study.  See HPI.   Orders Placed This Encounter  Procedures   ANA, IFA (with reflex)   No orders of the defined types were placed in this encounter.   Cc: Alysia Penna, MD,  Alysia Penna, MD  Naomie Dean, MD  [facility] [address] [place], [place] [address]  Phone [phone] Fax [phone]  I spent over 70 minutes of face-to-face and non-face-to-face time with patient on the  1. ANA positive   2. Screen for Motor neuron disease (HCC)   3. Fasciculations   4. Muscle weakness   5. Muscular atrophy, unspecified site   6. Upper motor neuron disease (HCC)    diagnosis.  This included previsit chart review, lab review, study review, order entry, electronic health record documentation, patient education on the different diagnostic and therapeutic options, counseling and coordination of care, risks and benefits of  management, compliance, or risk factor reduction

## 2023-02-19 NOTE — Patient Instructions (Addendum)
Cramps: Magnesium doses and other OTC medications you can try: 200 mg mag citrate 1-2x daily 400 mg mag oxide 1-2x daily Mag sulfate 600 mg daily Magnesium glycinate 200-400mg  at bedtime Find a spot in 8 weeks  Tonic water with quinine, banana, magnesium as above  Back pain, fasciculations, cramps:   Gabapentin 100mg  at bedtime can increase dosage or frequency during the day if needed  Water therapy and physical therapy  Discussed genetic testing for "channelopathies" that can cause nerve disease(example SCN4A) pros and cons  injections, then facet injections or other procedures, spinal cord stimulator  MRI of the brain and cervical spine to evaluate for causes in the brain or spinal cord for symptoms  Rechecked ANA for autoimmune disorders  Personal trainer for controlled weights if possible  Monitoring caffeine, treating anxiety(?)  See the handout on bening fasciculations  Muscle Cramps and Spasms Muscle cramps and spasms occur when a muscle or muscles tighten and you have no control over this tightening (involuntary muscle contraction). They are a common problem that can happen in any muscle. The most common place is in the calf muscles of the leg. There are a few ways that muscle cramps and spasms differ: Muscle cramps are painful. They come and go and may last for a few seconds or up to 15 minutes. Muscle cramps are often more forceful and last longer than muscle spasms. Muscle spasms may or may not be painful. They may last just a few seconds or last much longer. Certain conditions, such as diabetes or Parkinson's disease, can make you more likely to have cramps or spasms. But in most cases, cramps and spasms are not caused by other conditions. Common causes include: Overexertion. This is when you do more physical work or exercise than your body is ready for. Overuse from doing the same movements too many times. Staying in one position for too long. Improper preparation,  form, or technique when playing a sport or doing an activity. Not enough water or other fluids in your body (dehydration). Other causes may include: Injury. Side effects of some medicines. Too few salts and minerals in your body (electrolytes), such as potassium and calcium. This could happen if you are taking water pills (diuretics) or if you are pregnant. In many cases, the cause of muscle cramps or spasms is not known. Follow these instructions at home: Eating and drinking Drink enough fluid to keep your pee (urine) pale yellow. This can help prevent cramps or spasms. Eat a healthy diet that includes a lot of nutrients to help your muscles work. A healthy diet includes fruits and vegetables, lean protein, whole grains, and low-fat or nonfat dairy products. Managing pain and stiffness     Try to massage, stretch, and relax the affected muscle. Do this for a few minutes at a time. If told, put ice on the muscles. This may help if you are sore or have pain after a cramp or spasm. Put ice in a plastic bag. Place a towel between your skin and the bag. Leave the ice on for 20 minutes, 2-3 times a day. If told, apply heat to tight or tense muscles as often as told by your health care provider. Use the heat source that your provider recommends, such as a moist heat pack or a heating pad. Place a towel between your skin and the heat source. Leave the heat on for 20-30 minutes. If your skin turns bright red, remove the ice or heat right away to prevent  skin damage. The risk of damage is higher if you cannot feel pain, heat, or cold. Take hot showers or baths to help relax tight muscles. General instructions If you are having cramps often, avoid intense exercise for a few days. Take over-the-counter and prescription medicines only as told by your provider. Watch for any changes in your symptoms. Contact a health care provider if: Your cramps or spasms get more severe or happen more often. Your  cramps or spasms do not get better over time. This information is not intended to replace advice given to you by your health care provider. Make sure you discuss any questions you have with your health care provider. Document Revised: 10/07/2021 Document Reviewed: 10/07/2021 Elsevier Patient Education  2024 Elsevier Inc.  Gabapentin Capsules or Tablets What is this medication? GABAPENTIN (GA ba pen tin) treats nerve pain. It may also be used to prevent and control seizures in people with epilepsy. It works by calming overactive nerves in your body. This medicine may be used for other purposes; ask your health care provider or pharmacist if you have questions. COMMON BRAND NAME(S): Active-PAC with Gabapentin, Ascencion Dike, Gralise, Neurontin What should I tell my care team before I take this medication? They need to know if you have any of these conditions: Kidney disease Lung or breathing disease Substance use disorder Suicidal thoughts, plans, or attempt by you or a family member An unusual or allergic reaction to gabapentin, other medications, foods, dyes, or preservatives Pregnant or trying to get pregnant Breastfeeding How should I use this medication? Take this medication by mouth with a glass of water. Follow the directions on the prescription label. You can take it with or without food. If it upsets your stomach, take it with food. Take your medication at regular intervals. Do not take it more often than directed. Do not stop taking except on your care team's advice. If you are directed to break the 600 or 800 mg tablets in half as part of your dose, the extra half tablet should be used for the next dose. If you have not used the extra half tablet within 28 days, it should be thrown away. A special MedGuide will be given to you by the pharmacist with each prescription and refill. Be sure to read this information carefully each time. Talk to your care team about the use of this medication  in children. While this medication may be prescribed for children as young as 3 years for selected conditions, precautions do apply. Overdosage: If you think you have taken too much of this medicine contact a poison control center or emergency room at once. NOTE: This medicine is only for you. Do not share this medicine with others. What if I miss a dose? If you miss a dose, take it as soon as you can. If it is almost time for your next dose, take only that dose. Do not take double or extra doses. What may interact with this medication? Alcohol Antihistamines for allergy, cough, and cold Certain medications for anxiety or sleep Certain medications for depression like amitriptyline, fluoxetine, sertraline Certain medications for seizures like phenobarbital, primidone Certain medications for stomach problems General anesthetics like halothane, isoflurane, methoxyflurane, propofol Local anesthetics like lidocaine, pramoxine, tetracaine Medications that relax muscles for surgery Opioid medications for pain Phenothiazines like chlorpromazine, mesoridazine, prochlorperazine, thioridazine This list may not describe all possible interactions. Give your health care provider a list of all the medicines, herbs, non-prescription drugs, or dietary supplements you use. Also tell  them if you smoke, drink alcohol, or use illegal drugs. Some items may interact with your medicine. What should I watch for while using this medication? Visit your care team for regular checks on your progress. You may want to keep a record at home of how you feel your condition is responding to treatment. You may want to share this information with your care team at each visit. You should contact your care team if your seizures get worse or if you have any new types of seizures. Do not stop taking this medication or any of your seizure medications unless instructed by your care team. Stopping your medication suddenly can increase your  seizures or their severity. This medication may cause serious skin reactions. They can happen weeks to months after starting the medication. Contact your care team right away if you notice fevers or flu-like symptoms with a rash. The rash may be red or purple and then turn into blisters or peeling of the skin. Or, you might notice a red rash with swelling of the face, lips or lymph nodes in your neck or under your arms. Wear a medical identification bracelet or chain if you are taking this medication for seizures. Carry a card that lists all your medications. This medication may affect your coordination, reaction time, or judgment. Do not drive or operate machinery until you know how this medication affects you. Sit up or stand slowly to reduce the risk of dizzy or fainting spells. Drinking alcohol with this medication can increase the risk of these side effects. Your mouth may get dry. Chewing sugarless gum or sucking hard candy, and drinking plenty of water may help. Watch for new or worsening thoughts of suicide or depression. This includes sudden changes in mood, behaviors, or thoughts. These changes can happen at any time but are more common in the beginning of treatment or after a change in dose. Call your care team right away if you experience these thoughts or worsening depression. If you become pregnant while using this medication, you may enroll in the [place] American Antiepileptic Drug Pregnancy Registry by calling [phone]. This registry collects information about the safety of antiepileptic medication use during pregnancy. What side effects may I notice from receiving this medication? Side effects that you should report to your care team as soon as possible: Allergic reactions or angioedema--skin rash, itching, hives, swelling of the face, eyes, lips, tongue, arms, or legs, trouble swallowing or breathing Rash, fever, and swollen lymph nodes Thoughts of suicide or self harm, worsening  mood, feelings of depression Trouble breathing Unusual changes in mood or behavior in children after use such as difficulty concentrating, hostility, or restlessness Side effects that usually do not require medical attention (report to your care team if they continue or are bothersome): Dizziness Drowsiness Nausea Swelling of ankles, feet, or hands Vomiting This list may not describe all possible side effects. Call your doctor for medical advice about side effects. You may report side effects to FDA at 1-800-FDA-1088. Where should I keep my medication? Keep out of reach of children and pets. Store at room temperature between 15 and 30 degrees C (59 and 86 degrees F). Get rid of any unused medication after the expiration date. This medication may cause accidental overdose and death if taken by other adults, children, or pets. To get rid of medications that are no longer needed or have expired: Take the medication to a medication take-back program. Check with your pharmacy or law enforcement to find a  location. If you cannot return the medication, check the label or package insert to see if the medication should be thrown out in the garbage or flushed down the toilet. If you are not sure, ask your care team. If it is safe to put it in the trash, empty the medication out of the container. Mix the medication with cat litter, dirt, coffee grounds, or other unwanted substance. Seal the mixture in a bag or container. Put it in the trash. NOTE: This sheet is a summary. It may not cover all possible information. If you have questions about this medicine, talk to your doctor, pharmacist, or health care provider.  2024 Elsevier/Gold Standard (2021-12-02 00:00:00)

## 2023-02-21 ENCOUNTER — Telehealth: Payer: Self-pay | Admitting: Neurology

## 2023-02-21 NOTE — Telephone Encounter (Signed)
Julie Tapia and Julie Tapia, please call patient and let her know that I do not have anything in 8 weeks we are completely booked out unfortunately for almost 6 months.  This may not be a bad thing however because she still has physical therapy to go through and some other things that could take 3 months anyway so we should see how she is doing after those are completed.  I can offer her a 30-minute (please let her know that only 30 minutes I usually spend more time with her unfortunately given the backlog of new patients and have our scheduling out we may have to expedite this visit as compared to prior visits were of spent over 60 minutes whether) video appointment to check in with her in 12 weeks.  Before you call her lets discuss some options maybe I have some work ins, if I do please schedule her for 1 of those if not please come to me and we can find some things to offer her.  Thank you

## 2023-02-22 NOTE — Telephone Encounter (Signed)
Pt called backed and accepted the appt that was put on hold for her.

## 2023-02-22 NOTE — Telephone Encounter (Signed)
You have a work in slot open for 05/25/23 at 11:30am. That will push her follow up to 13 weeks. Is that slot okay to use for her?

## 2023-02-22 NOTE — Telephone Encounter (Signed)
Perfect thank you!

## 2023-02-22 NOTE — Telephone Encounter (Signed)
LVM asking pt to call back to schedule video visit with Dr. Lucia Gaskins  If pt calls back you can offer 05/25/23 at 11:30am

## 2023-02-23 ENCOUNTER — Telehealth: Payer: Self-pay | Admitting: Neurology

## 2023-02-23 LAB — ANTINUCLEAR ANTIBODIES, IFA: ANA Titer 1: NEGATIVE

## 2023-02-23 NOTE — Telephone Encounter (Signed)
medicare/[employer] NPR sent to [facility] [phone] 

## 2023-03-08 ENCOUNTER — Ambulatory Visit (HOSPITAL_BASED_OUTPATIENT_CLINIC_OR_DEPARTMENT_OTHER): Payer: Medicare Other | Admitting: Physical Therapy

## 2023-03-22 ENCOUNTER — Ambulatory Visit (HOSPITAL_BASED_OUTPATIENT_CLINIC_OR_DEPARTMENT_OTHER): Payer: Medicare Other | Admitting: Physical Therapy

## 2023-04-09 ENCOUNTER — Encounter (HOSPITAL_BASED_OUTPATIENT_CLINIC_OR_DEPARTMENT_OTHER): Payer: Self-pay | Admitting: Physical Therapy

## 2023-04-09 ENCOUNTER — Other Ambulatory Visit: Payer: Self-pay

## 2023-04-09 ENCOUNTER — Ambulatory Visit (HOSPITAL_BASED_OUTPATIENT_CLINIC_OR_DEPARTMENT_OTHER): Payer: Medicare Other | Attending: Anesthesiology | Admitting: Physical Therapy

## 2023-04-09 DIAGNOSIS — M6281 Muscle weakness (generalized): Secondary | ICD-10-CM | POA: Diagnosis not present

## 2023-04-09 DIAGNOSIS — M5459 Other low back pain: Secondary | ICD-10-CM | POA: Diagnosis not present

## 2023-04-09 DIAGNOSIS — R2689 Other abnormalities of gait and mobility: Secondary | ICD-10-CM | POA: Insufficient documentation

## 2023-04-09 NOTE — Therapy (Signed)
 OUTPATIENT PHYSICAL THERAPY THORACOLUMBAR EVALUATION   Patient Name: Julie Tapia MRN: [id number] DOB:1951/01/25, 73 y.o., female Today's Date: 04/09/2023  END OF SESSION:  PT End of Session - 04/09/23 1238     Visit Number 1    Date for PT Re-Evaluation 06/04/23    Authorization Type mcr    Progress Note Due on Visit 10    PT Start Time 1045    PT Stop Time 1130    PT Time Calculation (min) 45 min    Activity Tolerance Patient tolerated treatment well    Behavior During Therapy [facility] for tasks assessed/performed             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 tight chested-no inhalers in yrs.   Complication of anesthesia 01-16-11   Pt. very slow to awaken and was days before feeling like herself.   Past Surgical History:  Procedure Laterality Date   ABDOMINAL HYSTERECTOMY     BREAST CYST EXCISION  01-16-11   several excisions in the past   JOINT REPLACEMENT  01-16-11   Rt. great toe ball joint replacement-metal   REDUCTION MAMMAPLASTY  01-16-11   Bil. '86( problems with s/p surgical clot at rt. breast drainage site)   TOTAL HIP ARTHROPLASTY  01/20/2011   Procedure: TOTAL HIP ARTHROPLASTY ANTERIOR APPROACH;  Surgeon: Donnice JONETTA Car;  Location: [facility];  Service: Orthopedics;  Laterality: Right;   Patient Active Problem List   Diagnosis Date Noted   Cerebral aneurysm 01/12/2021   PFO (patent foramen ovale) 01/12/2021    PCP: Glendia Freeman MD  REFERRING PROVIDER:   Laqueta Ozell JONETTA, MD    REFERRING DIAG: M54.16 (ICD-10-CM) - Radiculopathy, lumbar region   Rationale for Evaluation and Treatment: Rehabilitation  THERAPY DIAG:   Muscle weakness (generalized)  Other abnormalities of gait and mobility  Other low back pain  ONSET DATE: last year  SUBJECTIVE:                                                                                                                                                                                           SUBJECTIVE STATEMENT: Began/Back injection 2007-8.  Went dormant.  Returned 2020.  Summer 2024 hurt my back lifting case of water .  Have had back pain continual since. Laqueta  has given injections in lumbar spine and L SIJ. Fasciculation/cramps were bad in my calves.  Better with gabapentin  low dose.  Most nights I can sleep (2-3 x week not sleeping). I have atrophied a lot.  Have a treadmill that I walk on it 5/7 days a week 6000-10000 step.  I immediately lay on mat doing exercises I was shown form other PT in past after finishing on bike. May need to have my left hip replaced pain and stiffness there.  I feel stiff in my back and hip. Weakness has been significantly decreasing since the summer. Tested for ALS neg  PERTINENT HISTORY:  R THR  PAIN:  Are you having pain? Yes: NPRS scale: current 0/10; worst when stretching after walking 4/10 Pain location: LB; radiates up spine; anterior medial thigh to knee and calf Pain description: cramping; ache; pressure Aggravating factors: walking on treadmill Relieving factors: rest/ stretching, meds  PRECAUTIONS: None  RED FLAGS: None   WEIGHT BEARING RESTRICTIONS: No  FALLS:  Has patient fallen in last 6 months? No  LIVING ENVIRONMENT: Lives with: lives alone Lives in: House/apartment Stairs: Yes: External: 8 steps; on right going up Has following equipment at home: None  OCCUPATION: retired last year;  PLOF: Independent  PATIENT GOALS: Increase strength, improve overall health, balance to do more  NEXT MD VISIT: end of March  OBJECTIVE:  Note: Objective measures were completed at Evaluation unless otherwise  noted.  DIAGNOSTIC FINDINGS:  MRI lumbar spine dated October 08, 2018 shows type II lumbosacral transitional vertebrae. At L2-3 there is moderate left and mild to moderate right neural foraminal narrowing with contact of the exiting bilateral L2 nerve roots worse on the left than the right. There is moderate left subarticular zone narrowing with mild spinal canal stenosis impinging upon with mild to moderate displacement of the left L3 nerve root due to a very small central disc extrusion superimposed upon a severe bulging disc osteophyte complex asymmetric to the left. At L3-4 there is a disc extrusion superimposed upon a severe left lateral bulging disc osteophyte and severe left disc height loss resulting in moderate to severe left foraminal narrowing and impingement upon the exiting left L3 nerve root. Moderate left and mild right subarticular zone narrowing and mild spinal canal stenosis with impingement upon the descending left L4 nerve root. At L4-5 there is moderate to severe left and moderate right facet arthrosis. Mild dextroconvex lumbar scoliosis with apex at L3.   PATIENT SURVEYS:  ODI  14(2)=28/45= 62%  COGNITION: Overall cognitive status: Within functional limits for tasks assessed     SENSATION: dysesthesias posterior calf pain and pain in the sole of her foot  MUSCLE LENGTH: Hamstrings: tight   POSTURE: rounded shoulders and increased thoracic kyphosis   LUMBAR ROM:   wfl  LOWER EXTREMITY ROM:     wfl  LOWER EXTREMITY MMT:    MMT Right eval Left eval  Hip flexion 39.5 29.8  Hip extension    Hip abduction 13.9 18.0  Hip adduction    Hip internal rotation    Hip external rotation    Knee flexion    Knee extension 26.4 22.6  Ankle dorsiflexion    Ankle plantarflexion    Ankle inversion    Ankle eversion     (Blank rows = not tested)  LUMBAR SPECIAL TESTS:  Straight leg raise test: Negative and Slump test: Negative  FUNCTIONAL TESTS:  5 times sit to  stand: 13.80 Timed up and go (TUG): 10.79  4 stage: passed 1&2.  Tandem x 5s; SLS x 7  GAIT: Distance walked: unlimited distance Assistive device utilized: None Level of assistance: Complete Independence Comments: wfl  TREATMENT                                                                                                                          Eval Self care: pt instructed on daily exercise using her treadmill; stretching program. Importance of resting/recovery   PATIENT EDUCATION:  Education details: Discussed eval findings, rehab rationale, aquatic program progression/POC and pools in area. Patient is in agreement  Person educated: Patient Education method: Explanation Education comprehension: verbalized understanding  HOME EXERCISE PROGRAM: Pt follows indep  ASSESSMENT:  CLINICAL IMPRESSION: Patient is a 73 y.o. f who was seen today for physical therapy evaluation and treatment for Radiculopathy, lumbar region. She reports dysesthesias posterior calf pain and pain in the sole of her foot with associated significant calf cramping which has subsided since taking gabapentin .  Steroid injections in lumbar spine and r SIJ has significantly decreased LBP but she continues to have episodes of radicular discomfort in right anteromedial thigh to knee she reports as tightness and tingling.  She is an active senior but has become afraid of falling (has not had any falls) due to an increase in general weakness and reports of general muscle atrophy. She does follow her own exercise program walking on treadmill x 40 mins + 5 days a week but does not do other things she enjoys doing outside her home due to fear of falling. Objective and functional testing demonstrate LE and core weakness.   She will benefit from skilled PT intervention to improve all areas of deficits and return pt to improved QOL. Will begin in aquatics then transition land based when approp.    OBJECTIVE IMPAIRMENTS:  decreased activity tolerance, decreased balance, decreased endurance, decreased mobility, difficulty walking, and decreased strength.   ACTIVITY LIMITATIONS: carrying, lifting, bending, standing, squatting, stairs, transfers, and locomotion level  PARTICIPATION LIMITATIONS: cleaning, community activity, occupation, and yard work  PERSONAL FACTORS: Time since onset of injury/illness/exacerbation are also affecting patient's functional outcome.   REHAB POTENTIAL: Good  CLINICAL DECISION MAKING: Evolving/moderate complexity  EVALUATION COMPLEXITY: Moderate   GOALS: Goals reviewed with patient? Yes  SHORT TERM GOALS: Target date: 05/10/23  Pt will tolerate full aquatic sessions consistently without increase in pain and with improving function to demonstrate good toleration and effectiveness of intervention.  Baseline: Goal status: INITIAL  2. Pt will perform SLS and tandem stance in 3.6 ft unsupported x 20s Baseline: see chart Goal INITIAL  3.Pt will report 50% decrease in fear of falling. Baseline: afraid to come outside my box Goal status: INITIAL  4.  Pt will improve on 5 X STS test to <or= 10s to demonstrate improving functional lower extremity strength, transitional movements, and balance Baseline: 13.80 Goal status: INITIAL   LONG TERM GOALS: Target date: 06/04/23  Pt will improve on ODI by 25%  to demonstrate improved function ability to do more. Baseline: 62% Goal status: INITIAL  2.  Pt will report running vacuum cleaner (Complete house work) without limitation to fatigue or pain Baseline:  Goal status: INITIAL  3.  Pt to be able to take trash can to and from curb safely Baseline:  Goal status: INITIAL  4.  Pt will report decreased waking from cramps at night to 1x week or less Baseline: 3 Goal status: INITIAL  5.  Pt will improve strength in all areas listed by at least 10 lbs  to demonstrate improved overall physical function Baseline: see chart Goal  status: INITIAL  6.   Pt will report ability to complete a full workout (completing cardio/ lifting)  Baseline:  Goal status: INITIAL  PLAN:  PT FREQUENCY: 2x/week  PT DURATION: 8 weeks  PLANNED INTERVENTIONS: 97164- PT Re-evaluation, 97110-Therapeutic exercises, 97530- Therapeutic activity, 97112- Neuromuscular re-education, 97535- Self Care, 02859- Manual therapy, [phone]- Gait training, [phone]- Orthotic Fit/training, [phone]- Aquatic Therapy, 97014- Electrical stimulation (unattended), [phone]- Ionotophoresis 4mg /ml Dexamethasone , Patient/Family education, Balance training, Stair training, Taping, Dry Needling, Joint mobilization, DME instructions, Cryotherapy, and Moist heat.  PLAN FOR NEXT SESSION: aquatics: general strengthening; balance retraining HEP   Ronal Foots) Omega Slager MPT 04/09/23 1:10 PM [facility] [address] [place], [place], [address] Phone: [phone]   Fax:  [phone]

## 2023-04-14 DIAGNOSIS — D649 Anemia, unspecified: Secondary | ICD-10-CM | POA: Diagnosis not present

## 2023-04-14 DIAGNOSIS — R7989 Other specified abnormal findings of blood chemistry: Secondary | ICD-10-CM | POA: Diagnosis not present

## 2023-04-14 DIAGNOSIS — E785 Hyperlipidemia, unspecified: Secondary | ICD-10-CM | POA: Diagnosis not present

## 2023-04-14 LAB — LAB REPORT - SCANNED: EGFR: 70.7

## 2023-04-21 DIAGNOSIS — L989 Disorder of the skin and subcutaneous tissue, unspecified: Secondary | ICD-10-CM | POA: Diagnosis not present

## 2023-04-21 DIAGNOSIS — R5383 Other fatigue: Secondary | ICD-10-CM | POA: Diagnosis not present

## 2023-04-21 DIAGNOSIS — Z0000 Encounter for general adult medical examination without abnormal findings: Secondary | ICD-10-CM | POA: Diagnosis not present

## 2023-04-21 DIAGNOSIS — M48061 Spinal stenosis, lumbar region without neurogenic claudication: Secondary | ICD-10-CM | POA: Diagnosis not present

## 2023-04-21 DIAGNOSIS — M1990 Unspecified osteoarthritis, unspecified site: Secondary | ICD-10-CM | POA: Diagnosis not present

## 2023-04-21 DIAGNOSIS — Z1331 Encounter for screening for depression: Secondary | ICD-10-CM | POA: Diagnosis not present

## 2023-04-21 DIAGNOSIS — E785 Hyperlipidemia, unspecified: Secondary | ICD-10-CM | POA: Diagnosis not present

## 2023-04-21 DIAGNOSIS — Z1339 Encounter for screening examination for other mental health and behavioral disorders: Secondary | ICD-10-CM | POA: Diagnosis not present

## 2023-04-21 DIAGNOSIS — M5126 Other intervertebral disc displacement, lumbar region: Secondary | ICD-10-CM | POA: Diagnosis not present

## 2023-04-21 DIAGNOSIS — I671 Cerebral aneurysm, nonruptured: Secondary | ICD-10-CM | POA: Diagnosis not present

## 2023-04-21 DIAGNOSIS — Q2112 Patent foramen ovale: Secondary | ICD-10-CM | POA: Diagnosis not present

## 2023-04-21 DIAGNOSIS — I2584 Coronary atherosclerosis due to calcified coronary lesion: Secondary | ICD-10-CM | POA: Diagnosis not present

## 2023-04-22 ENCOUNTER — Other Ambulatory Visit: Payer: Self-pay | Admitting: Internal Medicine

## 2023-04-22 DIAGNOSIS — Z1231 Encounter for screening mammogram for malignant neoplasm of breast: Secondary | ICD-10-CM

## 2023-04-26 ENCOUNTER — Encounter: Payer: Self-pay | Admitting: Internal Medicine

## 2023-05-05 ENCOUNTER — Encounter (HOSPITAL_BASED_OUTPATIENT_CLINIC_OR_DEPARTMENT_OTHER): Payer: Self-pay | Admitting: Physical Therapy

## 2023-05-05 ENCOUNTER — Ambulatory Visit (HOSPITAL_BASED_OUTPATIENT_CLINIC_OR_DEPARTMENT_OTHER): Payer: Medicare Other | Attending: Anesthesiology | Admitting: Physical Therapy

## 2023-05-05 DIAGNOSIS — R2689 Other abnormalities of gait and mobility: Secondary | ICD-10-CM | POA: Insufficient documentation

## 2023-05-05 DIAGNOSIS — M5459 Other low back pain: Secondary | ICD-10-CM | POA: Diagnosis not present

## 2023-05-05 DIAGNOSIS — M6281 Muscle weakness (generalized): Secondary | ICD-10-CM | POA: Diagnosis not present

## 2023-05-05 NOTE — Therapy (Signed)
 OUTPATIENT PHYSICAL THERAPY THORACOLUMBAR EVALUATION   Patient Name: Julie Tapia MRN: [id number] DOB:[birth year], 73 y.o., female Today's Date: 05/05/2023  END OF SESSION:  PT End of Session - 05/05/23 0940     Visit Number 2    Date for PT Re-Evaluation 06/04/23    Authorization Type mcr    Progress Note Due on Visit 10    PT Start Time [phone]    PT Stop Time 0930    PT Time Calculation (min) 44 min    Activity Tolerance Patient tolerated treatment well    Behavior During Therapy [facility] for tasks assessed/performed              Past Medical History:  Diagnosis Date   Aneurysm (HCC) 01-16-11   "lt .side of head not intra(cavity area)/Found during series of tests to r/o TIA's"-report from [facility] showed  Lt. ICA  aneurysm   Arthritis 01-16-11   osteoarthritis,-knees, hips, ankles. 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 tight chested-"no inhalers in yrs.   Complication of anesthesia 01-16-11   Pt. very slow to awaken and was days before feeling like herself.   Past Surgical History:  Procedure Laterality Date   ABDOMINAL HYSTERECTOMY     BREAST CYST EXCISION  01-16-11   several excisions in the past   JOINT REPLACEMENT  01-16-11   Rt. great toe ball joint replacement-metal   REDUCTION MAMMAPLASTY  01-16-11   Bil. '86( problems with s/p surgical clot at rt. breast drainage site)   TOTAL HIP ARTHROPLASTY  01/20/2011   Procedure: TOTAL HIP ARTHROPLASTY ANTERIOR APPROACH;  Surgeon: Shelda Pal;  Location: [facility];  Service: Orthopedics;  Laterality: Right;   Patient Active Problem List   Diagnosis Date Noted   Cerebral aneurysm 01/12/2021   PFO (patent foramen ovale) 01/12/2021    PCP: Alysia Penna MD  REFERRING PROVIDER:   Windle Guard, MD    REFERRING DIAG: M54.16 (ICD-10-CM) - Radiculopathy, lumbar region   Rationale for Evaluation and Treatment: Rehabilitation  THERAPY DIAG:   Muscle weakness (generalized)  Other abnormalities of gait and mobility  Other low back pain  ONSET DATE: last year  SUBJECTIVE:                                                                                                                                                                                           SUBJECTIVE STATEMENT: "I had a bad night was up with cramps a lot.  Calves and hips are sore and tight"   Initial Subjective Began/Back injection 2007-8.  Went dormant.  Returned 2020.  Summer 2024 hurt my back lifting case of water.  Have had back pain continual since. Drucie Ip has given injections in lumbar spine and L SIJ. Fasciculation/cramps were bad in my calves.  Better with gabapentin low dose.  Most nights I can sleep (2-3 x week not sleeping). I have atrophied a lot.  Have a treadmill that I walk on it 5/7 days a week 6000-10000 step.  I immediately lay on mat doing exercises I was shown form other PT in past after finishing on bike. May need to have my left hip replaced pain and stiffness there.  I feel stiff in my back and hip. Weakness has been significantly decreasing since the summer. Tested for ALS neg  PERTINENT HISTORY:  R THR  PAIN:  Are you having pain? Yes: NPRS scale: current 1/10; worst when stretching after walking 4/10 Pain location: LB; radiates up spine; anterior medial thigh to knee and calf Pain description: cramping; ache; pressure Aggravating factors: walking on treadmill Relieving factors: rest/ stretching, meds  PRECAUTIONS: None  RED FLAGS: None   WEIGHT BEARING RESTRICTIONS: No  FALLS:  Has patient fallen in last 6 months? No  LIVING ENVIRONMENT: Lives with: lives alone Lives in: House/apartment Stairs: Yes: External: 8 steps; on right going up Has following equipment at home: None  OCCUPATION: retired last year;  PLOF: Independent  PATIENT GOALS: Increase strength, improve overall health, balance to do more  NEXT MD VISIT:  end of March  OBJECTIVE:  Note: Objective measures were completed at Evaluation unless otherwise noted.  DIAGNOSTIC FINDINGS:  MRI lumbar spine dated October 08, 2018 shows type II lumbosacral transitional vertebrae. At L2-3 there is moderate left and mild to moderate right neural foraminal narrowing with contact of the exiting bilateral L2 nerve roots worse on the left than the right. There is moderate left subarticular zone narrowing with mild spinal canal stenosis impinging upon with mild to moderate displacement of the left L3 nerve root due to a very small central disc extrusion superimposed upon a severe bulging disc osteophyte complex asymmetric to the left. At L3-4 there is a disc extrusion superimposed upon a severe left lateral bulging disc osteophyte and severe left disc height loss resulting in moderate to severe left foraminal narrowing and impingement upon the exiting left L3 nerve root. Moderate left and mild right subarticular zone narrowing and mild spinal canal stenosis with impingement upon the descending left L4 nerve root. At L4-5 there is moderate to severe left and moderate right facet arthrosis. Mild dextroconvex lumbar scoliosis with apex at L3.   PATIENT SURVEYS:  ODI  14(2)=28/45= 62%  COGNITION: Overall cognitive status: Within functional limits for tasks assessed     SENSATION: dysesthesias posterior calf pain and pain in the sole of her foot  MUSCLE LENGTH: Hamstrings: tight   POSTURE: rounded shoulders and increased thoracic kyphosis   LUMBAR ROM:   wfl  LOWER EXTREMITY ROM:     wfl  LOWER EXTREMITY MMT:    MMT Right eval Left eval  Hip flexion 39.5 29.8  Hip extension    Hip abduction 13.9 18.0  Hip adduction    Hip internal rotation    Hip external rotation    Knee flexion    Knee extension 26.4 22.6  Ankle dorsiflexion    Ankle plantarflexion    Ankle inversion    Ankle eversion     (Blank rows = not tested)  LUMBAR SPECIAL TESTS:   Straight leg  raise test: Negative and Slump test: Negative  FUNCTIONAL TESTS:  5 times sit to stand: 13.80 Timed up and go (TUG): 10.79 4 stage: passed 1&2.  Tandem x 5s; SLS x 7  GAIT: Distance walked: unlimited distance Assistive device utilized: None Level of assistance: Complete Independence Comments: wfl  TREATMENT                                                                                                                          [facility] Adult PT Treatment:                                                DATE: 05/05/23 Pt seen for aquatic therapy today.  Treatment took place in water 3.5-4.75 ft in depth at the [facility] pool. Temp of water was 91.  Pt entered/exited the pool via stairs with hand rail.  *intro to setting *walking forward and back unsupported *L stretch *calf stretch on bottom step *hip hiking bottom step 2 x 10 R/L *straddling noodle: cycling x 4 widths  -ue support corner wall: hip add/abd; hip flex/ext *Hollow noodle pull down wide stance then staggered  Pt requires the buoyancy and hydrostatic pressure of water for support, and to offload joints by unweighting joint load by at least 50 % in navel deep water and by at least 75-80% in chest to neck deep water.  Viscosity of the water is needed for resistance of strengthening. Water current perturbations provides challenge to standing balance requiring increased core activation.    PATIENT EDUCATION:  Education details: Discussed eval findings, rehab rationale, aquatic program progression/POC and pools in area. Patient is in agreement  Person educated: Patient Education method: Explanation Education comprehension: verbalized understanding  HOME EXERCISE PROGRAM: Pt follows indep  ASSESSMENT:  CLINICAL IMPRESSION: Pt demonstrates safety and independence in aquatic setting with therapist instructing from deck. She is confident  moving throughout all depths easily.  Directed through various  movement patterns and trials in standing and vertically suspended position.  Slight difficulty gaining sitting balance on noodle initially but given time it improves allowing for hip and knee unloaded movement in all planes for stretching and pain/stiffness reduction. She tolerates session well reporting decreased stiffness. She is a good candidate for aquatic intervention and will benefit from the properties of water to progress towards functional goals.      Initial impression Patient is a 73 y.o. f who was seen today for physical therapy evaluation and treatment for Radiculopathy, lumbar region. She reports dysesthesias posterior calf pain and pain in the sole of her foot with associated significant calf cramping which has subsided since taking gabapentin.  Steroid injections in lumbar spine and r SIJ has significantly decreased LBP but she continues to have episodes of radicular discomfort in right anteromedial thigh to knee she reports as tightness and tingling.  She is an active senior but has become afraid of falling (has not had any falls) due to an increase in general weakness and reports of general muscle atrophy. She does follow her own exercise program walking on treadmill x 40 mins + 5 days a week but does not do other things she enjoys doing outside her home due to fear of falling. Objective and functional testing demonstrate LE and core weakness.   She will benefit from skilled PT intervention to improve all areas of deficits and return pt to improved QOL. Will begin in aquatics then transition land based when approp.    OBJECTIVE IMPAIRMENTS: decreased activity tolerance, decreased balance, decreased endurance, decreased mobility, difficulty walking, and decreased strength.   ACTIVITY LIMITATIONS: carrying, lifting, bending, standing, squatting, stairs, transfers, and locomotion level  PARTICIPATION LIMITATIONS: cleaning, community activity, occupation, and yard work  PERSONAL FACTORS:  Time since onset of injury/illness/exacerbation are also affecting patient's functional outcome.   REHAB POTENTIAL: Good  CLINICAL DECISION MAKING: Evolving/moderate complexity  EVALUATION COMPLEXITY: Moderate   GOALS: Goals reviewed with patient? Yes  SHORT TERM GOALS: Target date: 05/10/23  Pt will tolerate full aquatic sessions consistently without increase in pain and with improving function to demonstrate good toleration and effectiveness of intervention.  Baseline: Goal status: INITIAL  2. Pt will perform SLS and tandem stance in 3.6 ft unsupported x 20s Baseline: see chart Goal INITIAL  3.Pt will report 50% decrease in fear of falling. Baseline: "afraid to come outside my box" Goal status: INITIAL  4.  Pt will improve on 5 X STS test to <or= 10s to demonstrate improving functional lower extremity strength, transitional movements, and balance Baseline: 13.80 Goal status: INITIAL   LONG TERM GOALS: Target date: 06/04/23  Pt will improve on ODI by 25% to demonstrate improved function ability "to do more". Baseline: 62% Goal status: INITIAL  2.  Pt will report running vacuum cleaner (Complete house work) without limitation to fatigue or pain Baseline:  Goal status: INITIAL  3.  Pt to be able to take trash can to and from curb safely Baseline:  Goal status: INITIAL  4.  Pt will report decreased waking from cramps at night to 1x week or less Baseline: 3 Goal status: INITIAL  5.  Pt will improve strength in all areas listed by at least 10 lbs  to demonstrate improved overall physical function Baseline: see chart Goal status: INITIAL  6.   Pt will report ability to complete a "full workout" (completing cardio/ lifting)  Baseline:  Goal status: INITIAL  PLAN:  PT FREQUENCY: 2x/week  PT DURATION: 8 weeks  PLANNED INTERVENTIONS: 97164- PT Re-evaluation, 97110-Therapeutic exercises, 97530- Therapeutic activity, 97112- Neuromuscular re-education, 97535- Self  Care, 65784- Manual therapy, [id number]- Gait training, [phone]- Orthotic Fit/training, [id number]- Aquatic Therapy, 97014- Electrical stimulation (unattended), [phone]- Ionotophoresis 4mg /ml Dexamethasone, Patient/Family education, Balance training, Stair training, Taping, Dry Needling, Joint mobilization, DME instructions, Cryotherapy, and Moist heat.  PLAN FOR NEXT SESSION: aquatics: general strengthening; balance retraining HEP   Rushie Chestnut) Lileigh Fahringer MPT 05/05/23 9:41 AM [facility] [address] [place], [place], [address] Phone: [phone]   Fax:  [phone]

## 2023-05-07 ENCOUNTER — Ambulatory Visit (HOSPITAL_BASED_OUTPATIENT_CLINIC_OR_DEPARTMENT_OTHER): Payer: Medicare Other | Admitting: Physical Therapy

## 2023-05-07 ENCOUNTER — Encounter (HOSPITAL_BASED_OUTPATIENT_CLINIC_OR_DEPARTMENT_OTHER): Payer: Self-pay | Admitting: Physical Therapy

## 2023-05-07 DIAGNOSIS — M6281 Muscle weakness (generalized): Secondary | ICD-10-CM

## 2023-05-07 DIAGNOSIS — M5459 Other low back pain: Secondary | ICD-10-CM | POA: Diagnosis not present

## 2023-05-07 DIAGNOSIS — R2689 Other abnormalities of gait and mobility: Secondary | ICD-10-CM

## 2023-05-07 NOTE — Therapy (Signed)
 OUTPATIENT PHYSICAL THERAPY THORACOLUMBAR TREATMENT   Patient Name: Julie Tapia MRN: [id number] DOB:21-Aug-1950, 73 y.o., female Today's Date: 05/07/2023  END OF SESSION:  PT End of Session - 05/07/23 0959     Visit Number 3    Date for PT Re-Evaluation 06/04/23    Authorization Type mcr    Progress Note Due on Visit 10    PT Start Time 1000    PT Stop Time 1040    PT Time Calculation (min) 40 min    Activity Tolerance Patient tolerated treatment well    Behavior During Therapy [facility] for tasks assessed/performed              Past Medical History:  Diagnosis Date   Aneurysm (HCC) 01-16-11   "lt .side of head not intra(cavity area)/Found during series of tests to r/o TIA's"-report from [facility] showed  Lt. ICA  aneurysm   Arthritis 01-16-11   osteoarthritis,-knees, hips, ankles. 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 tight chested-"no inhalers in yrs.   Complication of anesthesia 01-16-11   Pt. very slow to awaken and was days before feeling like herself.   Past Surgical History:  Procedure Laterality Date   ABDOMINAL HYSTERECTOMY     BREAST CYST EXCISION  01-16-11   several excisions in the past   JOINT REPLACEMENT  01-16-11   Rt. great toe ball joint replacement-metal   REDUCTION MAMMAPLASTY  01-16-11   Bil. '86( problems with s/p surgical clot at rt. breast drainage site)   TOTAL HIP ARTHROPLASTY  01/20/2011   Procedure: TOTAL HIP ARTHROPLASTY ANTERIOR APPROACH;  Surgeon: Shelda Pal;  Location: [facility];  Service: Orthopedics;  Laterality: Right;   Patient Active Problem List   Diagnosis Date Noted   Cerebral aneurysm 01/12/2021   PFO (patent foramen ovale) 01/12/2021    PCP: Alysia Penna MD  REFERRING PROVIDER:   Windle Guard, MD    REFERRING DIAG: M54.16 (ICD-10-CM) - Radiculopathy, lumbar region   Rationale for Evaluation and Treatment: Rehabilitation  THERAPY DIAG:   Muscle weakness (generalized)  Other abnormalities of gait and mobility  Other low back pain  ONSET DATE: last year  SUBJECTIVE:                                                                                                                                                                                           SUBJECTIVE STATEMENT: "I hurt really bad and had terrible spasms in my calves and inner thighs after working with you. None of my normal remedies helped.  I  slept sitting up last night"   Initial Subjective Began/Back injection 2007-8.  Went dormant.  Returned 2020.  Summer 2024 hurt my back lifting case of water.  Have had back pain continual since. Drucie Ip has given injections in lumbar spine and L SIJ. Fasciculation/cramps were bad in my calves.  Better with gabapentin low dose.  Most nights I can sleep (2-3 x week not sleeping). I have atrophied a lot.  Have a treadmill that I walk on it 5/7 days a week 6000-10000 step.  I immediately lay on mat doing exercises I was shown form other PT in past after finishing on bike. May need to have my left hip replaced pain and stiffness there.  I feel stiff in my back and hip. Weakness has been significantly decreasing since the summer. Tested for ALS neg  PERTINENT HISTORY:  R THR  PAIN:  Are you having pain? Yes: NPRS scale: current 1/10; worst when stretching after walking 4/10 Pain location: LB; radiates up spine; anterior medial thigh to knee and calf Pain description: cramping; ache; pressure Aggravating factors: walking on treadmill Relieving factors: rest/ stretching, meds  PRECAUTIONS: None  RED FLAGS: None   WEIGHT BEARING RESTRICTIONS: No  FALLS:  Has patient fallen in last 6 months? No  LIVING ENVIRONMENT: Lives with: lives alone Lives in: House/apartment Stairs: Yes: External: 8 steps; on right going up Has following equipment at home: None  OCCUPATION: retired last year;  PLOF: Independent  PATIENT  GOALS: Increase strength, improve overall health, balance to do more  NEXT MD VISIT: end of March  OBJECTIVE:  Note: Objective measures were completed at Evaluation unless otherwise noted.  DIAGNOSTIC FINDINGS:  MRI lumbar spine dated October 08, 2018 shows type II lumbosacral transitional vertebrae. At L2-3 there is moderate left and mild to moderate right neural foraminal narrowing with contact of the exiting bilateral L2 nerve roots worse on the left than the right. There is moderate left subarticular zone narrowing with mild spinal canal stenosis impinging upon with mild to moderate displacement of the left L3 nerve root due to a very small central disc extrusion superimposed upon a severe bulging disc osteophyte complex asymmetric to the left. At L3-4 there is a disc extrusion superimposed upon a severe left lateral bulging disc osteophyte and severe left disc height loss resulting in moderate to severe left foraminal narrowing and impingement upon the exiting left L3 nerve root. Moderate left and mild right subarticular zone narrowing and mild spinal canal stenosis with impingement upon the descending left L4 nerve root. At L4-5 there is moderate to severe left and moderate right facet arthrosis. Mild dextroconvex lumbar scoliosis with apex at L3.   PATIENT SURVEYS:  ODI  14(2)=28/45= 62%  COGNITION: Overall cognitive status: Within functional limits for tasks assessed     SENSATION: dysesthesias posterior calf pain and pain in the sole of her foot  MUSCLE LENGTH: Hamstrings: tight   POSTURE: rounded shoulders and increased thoracic kyphosis   LUMBAR ROM:   wfl  LOWER EXTREMITY ROM:     wfl  LOWER EXTREMITY MMT:    MMT Right eval Left eval  Hip flexion 39.5 29.8  Hip extension    Hip abduction 13.9 18.0  Hip adduction    Hip internal rotation    Hip external rotation    Knee flexion    Knee extension 26.4 22.6  Ankle dorsiflexion    Ankle plantarflexion    Ankle  inversion    Ankle eversion     (  Blank rows = not tested)  LUMBAR SPECIAL TESTS:  Straight leg raise test: Negative and Slump test: Negative  FUNCTIONAL TESTS:  5 times sit to stand: 13.80 Timed up and go (TUG): 10.79 4 stage: passed 1&2.  Tandem x 5s; SLS x 7  GAIT: Distance walked: unlimited distance Assistive device utilized: None Level of assistance: Complete Independence Comments: wfl  TREATMENT                                                                                                                          [facility] Adult PT Treatment:                                                DATE: 05/05/23 Pt seen for aquatic therapy today.  Treatment took place in water 3.5-4.75 ft in depth at the [facility] pool. Temp of water was 91.  Pt entered/exited the pool via stairs with hand rail.   *walking forward and back unsupported *standing in 4.83ft ue support wall: df; pf; hip add/abd; hip extension; relaxed squats; hip circles.  Cues for avoiding end ranges *noodle wrapped posteriorly across chest: cycling x 4 widths  -wrapped anteriorly ue support corner wall: hip add/abd; hip flex/ext   Pt requires the buoyancy and hydrostatic pressure of water for support, and to offload joints by unweighting joint load by at least 50 % in navel deep water and by at least 75-80% in chest to neck deep water.  Viscosity of the water is needed for resistance of strengthening. Water current perturbations provides challenge to standing balance requiring increased core activation.    PATIENT EDUCATION:  Education details: Discussed eval findings, rehab rationale, aquatic program progression/POC and pools in area. Patient is in agreement  Person educated: Patient Education method: Explanation Education comprehension: verbalized understanding  HOME EXERCISE PROGRAM: Pt follows indep  ASSESSMENT:  CLINICAL IMPRESSION: Pt with adverse response to first aquatic session with extreme  cramping in calves and adductors impeding sleep and functional mobility.  Dialed session back to include only general movement patterns continuous of ~ 30 minutes.  No stretching or movements into end ranges.  No Jacuzzi (not billed) post session.  Trial to determine pt toleration/identifying if specific cause to adverse reaction.  Pt will report back next session.  She tolerates session without difficulty, no pain.  Goals ongoing.      Initial impression Patient is a 73 y.o. f who was seen today for physical therapy evaluation and treatment for Radiculopathy, lumbar region. She reports dysesthesias posterior calf pain and pain in the sole of her foot with associated significant calf cramping which has subsided since taking gabapentin.  Steroid injections in lumbar spine and r SIJ has significantly decreased LBP but she continues to have episodes of radicular discomfort in right anteromedial thigh to knee she reports as tightness  and tingling.  She is an active senior but has become afraid of falling (has not had any falls) due to an increase in general weakness and reports of general muscle atrophy. She does follow her own exercise program walking on treadmill x 40 mins + 5 days a week but does not do other things she enjoys doing outside her home due to fear of falling. Objective and functional testing demonstrate LE and core weakness.   She will benefit from skilled PT intervention to improve all areas of deficits and return pt to improved QOL. Will begin in aquatics then transition land based when approp.    OBJECTIVE IMPAIRMENTS: decreased activity tolerance, decreased balance, decreased endurance, decreased mobility, difficulty walking, and decreased strength.   ACTIVITY LIMITATIONS: carrying, lifting, bending, standing, squatting, stairs, transfers, and locomotion level  PARTICIPATION LIMITATIONS: cleaning, community activity, occupation, and yard work  PERSONAL FACTORS: Time since onset of  injury/illness/exacerbation are also affecting patient's functional outcome.   REHAB POTENTIAL: Good  CLINICAL DECISION MAKING: Evolving/moderate complexity  EVALUATION COMPLEXITY: Moderate   GOALS: Goals reviewed with patient? Yes  SHORT TERM GOALS: Target date: 05/10/23  Pt will tolerate full aquatic sessions consistently without increase in pain and with improving function to demonstrate good toleration and effectiveness of intervention.  Baseline: Goal status: INITIAL  2. Pt will perform SLS and tandem stance in 3.6 ft unsupported x 20s Baseline: see chart Goal INITIAL  3.Pt will report 50% decrease in fear of falling. Baseline: "afraid to come outside my box" Goal status: INITIAL  4.  Pt will improve on 5 X STS test to <or= 10s to demonstrate improving functional lower extremity strength, transitional movements, and balance Baseline: 13.80 Goal status: INITIAL   LONG TERM GOALS: Target date: 06/04/23  Pt will improve on ODI by 25% to demonstrate improved function ability "to do more". Baseline: 62% Goal status: INITIAL  2.  Pt will report running vacuum cleaner (Complete house work) without limitation to fatigue or pain Baseline:  Goal status: INITIAL  3.  Pt to be able to take trash can to and from curb safely Baseline:  Goal status: INITIAL  4.  Pt will report decreased waking from cramps at night to 1x week or less Baseline: 3 Goal status: INITIAL  5.  Pt will improve strength in all areas listed by at least 10 lbs  to demonstrate improved overall physical function Baseline: see chart Goal status: INITIAL  6.   Pt will report ability to complete a "full workout" (completing cardio/ lifting)  Baseline:  Goal status: INITIAL  PLAN:  PT FREQUENCY: 2x/week  PT DURATION: 8 weeks  PLANNED INTERVENTIONS: 97164- PT Re-evaluation, 97110-Therapeutic exercises, 97530- Therapeutic activity, 97112- Neuromuscular re-education, 97535- Self Care, [id number]- Manual  therapy, [id number]- Gait training, [phone]- Orthotic Fit/training, [phone]- Aquatic Therapy, 97014- Electrical stimulation (unattended), [phone]- Ionotophoresis 4mg /ml Dexamethasone, Patient/Family education, Balance training, Stair training, Taping, Dry Needling, Joint mobilization, DME instructions, Cryotherapy, and Moist heat.  PLAN FOR NEXT SESSION: aquatics: general strengthening; balance retraining HEP   Rushie Chestnut) Kaitlyn Skowron MPT 05/07/23 10:00 AM [facility] [address] [place], [place], [address] Phone: [phone]   Fax:  [phone]

## 2023-05-10 DIAGNOSIS — L299 Pruritus, unspecified: Secondary | ICD-10-CM | POA: Diagnosis not present

## 2023-05-10 DIAGNOSIS — M5459 Other low back pain: Secondary | ICD-10-CM | POA: Diagnosis not present

## 2023-05-10 DIAGNOSIS — L309 Dermatitis, unspecified: Secondary | ICD-10-CM | POA: Diagnosis not present

## 2023-05-10 DIAGNOSIS — M5416 Radiculopathy, lumbar region: Secondary | ICD-10-CM | POA: Diagnosis not present

## 2023-05-10 DIAGNOSIS — M533 Sacrococcygeal disorders, not elsewhere classified: Secondary | ICD-10-CM | POA: Diagnosis not present

## 2023-05-10 DIAGNOSIS — M5451 Vertebrogenic low back pain: Secondary | ICD-10-CM | POA: Diagnosis not present

## 2023-05-11 ENCOUNTER — Encounter (HOSPITAL_BASED_OUTPATIENT_CLINIC_OR_DEPARTMENT_OTHER): Payer: Self-pay | Admitting: Physical Therapy

## 2023-05-11 ENCOUNTER — Ambulatory Visit (HOSPITAL_BASED_OUTPATIENT_CLINIC_OR_DEPARTMENT_OTHER): Payer: Medicare Other | Admitting: Physical Therapy

## 2023-05-11 DIAGNOSIS — R2689 Other abnormalities of gait and mobility: Secondary | ICD-10-CM

## 2023-05-11 DIAGNOSIS — M6281 Muscle weakness (generalized): Secondary | ICD-10-CM

## 2023-05-11 DIAGNOSIS — M5459 Other low back pain: Secondary | ICD-10-CM

## 2023-05-11 NOTE — Therapy (Signed)
 OUTPATIENT PHYSICAL THERAPY THORACOLUMBAR TREATMENT   Patient Name: Julie Tapia MRN: [id number] DOB:September 28, 1950, 73 y.o., female Today's Date: 05/11/2023  END OF SESSION:  PT End of Session - 05/11/23 1111     Visit Number 4    Date for PT Re-Evaluation 06/04/23    Authorization Type mcr    Progress Note Due on Visit 10    PT Start Time 1102    PT Stop Time 1140    PT Time Calculation (min) 38 min    Activity Tolerance Patient tolerated treatment well    Behavior During Therapy [facility] for tasks assessed/performed              Past Medical History:  Diagnosis Date   Aneurysm (HCC) 01-16-11   "lt .side of head not intra(cavity area)/Found during series of tests to r/o TIA's"-report from [facility] showed  Lt. ICA  aneurysm   Arthritis 01-16-11   osteoarthritis,-knees, hips, ankles. 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 tight chested-"no inhalers in yrs.   Complication of anesthesia 01-16-11   Pt. very slow to awaken and was days before feeling like herself.   Past Surgical History:  Procedure Laterality Date   ABDOMINAL HYSTERECTOMY     BREAST CYST EXCISION  01-16-11   several excisions in the past   JOINT REPLACEMENT  01-16-11   Rt. great toe ball joint replacement-metal   REDUCTION MAMMAPLASTY  01-16-11   Bil. '86( problems with s/p surgical clot at rt. breast drainage site)   TOTAL HIP ARTHROPLASTY  01/20/2011   Procedure: TOTAL HIP ARTHROPLASTY ANTERIOR APPROACH;  Surgeon: Shelda Pal;  Location: [facility];  Service: Orthopedics;  Laterality: Right;   Patient Active Problem List   Diagnosis Date Noted   Cerebral aneurysm 01/12/2021   PFO (patent foramen ovale) 01/12/2021    PCP: Alysia Penna MD  REFERRING PROVIDER:   Windle Guard, MD    REFERRING DIAG: M54.16 (ICD-10-CM) - Radiculopathy, lumbar region   Rationale for Evaluation and Treatment: Rehabilitation  THERAPY DIAG:   Muscle weakness (generalized)  Other abnormalities of gait and mobility  Other low back pain  ONSET DATE: last year  SUBJECTIVE:                                                                                                                                                                                           SUBJECTIVE STATEMENT: tP reports good response to last session without increase in cramping as after initial aquatic session.  Did see Dr Drucie Ip yesterday who manipulated her SIJ  increasing her pain during the night   Initial Subjective Began/Back injection 2007-8.  Went dormant.  Returned 2020.  Summer 2024 hurt my back lifting case of water.  Have had back pain continual since. Drucie Ip has given injections in lumbar spine and L SIJ. Fasciculation/cramps were bad in my calves.  Better with gabapentin low dose.  Most nights I can sleep (2-3 x week not sleeping). I have atrophied a lot.  Have a treadmill that I walk on it 5/7 days a week 6000-10000 step.  I immediately lay on mat doing exercises I was shown form other PT in past after finishing on bike. May need to have my left hip replaced pain and stiffness there.  I feel stiff in my back and hip. Weakness has been significantly decreasing since the summer. Tested for ALS neg  PERTINENT HISTORY:  R THR  PAIN:  Are you having pain? Yes: NPRS scale: current 1/10; worst when stretching after walking 4/10 Pain location: LB; radiates up spine; anterior medial thigh to knee and calf Pain description: cramping; ache; pressure Aggravating factors: walking on treadmill Relieving factors: rest/ stretching, meds  PRECAUTIONS: None  RED FLAGS: None   WEIGHT BEARING RESTRICTIONS: No  FALLS:  Has patient fallen in last 6 months? No  LIVING ENVIRONMENT: Lives with: lives alone Lives in: House/apartment Stairs: Yes: External: 8 steps; on right going up Has following equipment at home: None  OCCUPATION: retired last  year;  PLOF: Independent  PATIENT GOALS: Increase strength, improve overall health, balance to do more  NEXT MD VISIT: end of March  OBJECTIVE:  Note: Objective measures were completed at Evaluation unless otherwise noted.  DIAGNOSTIC FINDINGS:  MRI lumbar spine dated October 08, 2018 shows type II lumbosacral transitional vertebrae. At L2-3 there is moderate left and mild to moderate right neural foraminal narrowing with contact of the exiting bilateral L2 nerve roots worse on the left than the right. There is moderate left subarticular zone narrowing with mild spinal canal stenosis impinging upon with mild to moderate displacement of the left L3 nerve root due to a very small central disc extrusion superimposed upon a severe bulging disc osteophyte complex asymmetric to the left. At L3-4 there is a disc extrusion superimposed upon a severe left lateral bulging disc osteophyte and severe left disc height loss resulting in moderate to severe left foraminal narrowing and impingement upon the exiting left L3 nerve root. Moderate left and mild right subarticular zone narrowing and mild spinal canal stenosis with impingement upon the descending left L4 nerve root. At L4-5 there is moderate to severe left and moderate right facet arthrosis. Mild dextroconvex lumbar scoliosis with apex at L3.   PATIENT SURVEYS:  ODI  14(2)=28/45= 62%  COGNITION: Overall cognitive status: Within functional limits for tasks assessed     SENSATION: dysesthesias posterior calf pain and pain in the sole of her foot  MUSCLE LENGTH: Hamstrings: tight   POSTURE: rounded shoulders and increased thoracic kyphosis   LUMBAR ROM:   wfl  LOWER EXTREMITY ROM:     wfl  LOWER EXTREMITY MMT:    MMT Right eval Left eval  Hip flexion 39.5 29.8  Hip extension    Hip abduction 13.9 18.0  Hip adduction    Hip internal rotation    Hip external rotation    Knee flexion    Knee extension 26.4 22.6  Ankle  dorsiflexion    Ankle plantarflexion    Ankle inversion    Ankle eversion     (  Blank rows = not tested)  LUMBAR SPECIAL TESTS:  Straight leg raise test: Negative and Slump test: Negative  FUNCTIONAL TESTS:  5 times sit to stand: 13.80 Timed up and go (TUG): 10.79 4 stage: passed 1&2.  Tandem x 5s; SLS x 7  GAIT: Distance walked: unlimited distance Assistive device utilized: None Level of assistance: Complete Independence Comments: wfl  TREATMENT                                                                                                                          [facility] Adult PT Treatment:                                                DATE: 05/05/23 Pt seen for aquatic therapy today.  Treatment took place in water 3.5-4.75 ft in depth at the [facility] pool. Temp of water was 91.  Pt entered/exited the pool via stairs with hand rail.   *walking forward and back unsupported *Unsupported: Walking forward ;  backward ;  side stepping .  *posterior pelvic tilts sitting swing like on noodle: attempted hip hiking but stopped after a few due to SIJ discomfort * arm addct/ abdct with rainbow hand floats x 2 laps. Cues for abd bracing *Forward step ups x 10 R/L without UE support->runners step up x 10 leading R/L *standing in 4.31ft ue support wall: df; pf; hip add/abd; hip extension; relaxed squats; hip circles.   *noodle wrapped posteriorly across chest: cycling x 4 widths  -wrapped anteriorly ue support corner wall: hip add/abd; hip flex/ext   Pt requires the buoyancy and hydrostatic pressure of water for support, and to offload joints by unweighting joint load by at least 50 % in navel deep water and by at least 75-80% in chest to neck deep water.  Viscosity of the water is needed for resistance of strengthening. Water current perturbations provides challenge to standing balance requiring increased core activation.    PATIENT EDUCATION:  Education details: Discussed eval  findings, rehab rationale, aquatic program progression/POC and pools in area. Patient is in agreement  Person educated: Patient Education method: Explanation Education comprehension: verbalized understanding  HOME EXERCISE PROGRAM: Pt follows indep  ASSESSMENT:  CLINICAL IMPRESSION: Improved toleration to last session.  Progressed session today adding pelvic tilts which she tolerates well.  Does not tolerate hip hiking. Reports pressure relief in SIJ with vertical suspension.  Goals ongoing      Initial impression Patient is a 73 y.o. f who was seen today for physical therapy evaluation and treatment for Radiculopathy, lumbar region. She reports dysesthesias posterior calf pain and pain in the sole of her foot with associated significant calf cramping which has subsided since taking gabapentin.  Steroid injections in lumbar spine and r SIJ has significantly decreased LBP but she continues to have episodes of radicular  discomfort in right anteromedial thigh to knee she reports as tightness and tingling.  She is an active senior but has become afraid of falling (has not had any falls) due to an increase in general weakness and reports of general muscle atrophy. She does follow her own exercise program walking on treadmill x 40 mins + 5 days a week but does not do other things she enjoys doing outside her home due to fear of falling. Objective and functional testing demonstrate LE and core weakness.   She will benefit from skilled PT intervention to improve all areas of deficits and return pt to improved QOL. Will begin in aquatics then transition land based when approp.    OBJECTIVE IMPAIRMENTS: decreased activity tolerance, decreased balance, decreased endurance, decreased mobility, difficulty walking, and decreased strength.   ACTIVITY LIMITATIONS: carrying, lifting, bending, standing, squatting, stairs, transfers, and locomotion level  PARTICIPATION LIMITATIONS: cleaning, community  activity, occupation, and yard work  PERSONAL FACTORS: Time since onset of injury/illness/exacerbation are also affecting patient's functional outcome.   REHAB POTENTIAL: Good  CLINICAL DECISION MAKING: Evolving/moderate complexity  EVALUATION COMPLEXITY: Moderate   GOALS: Goals reviewed with patient? Yes  SHORT TERM GOALS: Target date: 05/10/23  Pt will tolerate full aquatic sessions consistently without increase in pain and with improving function to demonstrate good toleration and effectiveness of intervention.  Baseline: Goal status: INITIAL  2. Pt will perform SLS and tandem stance in 3.6 ft unsupported x 20s Baseline: see chart Goal INITIAL  3.Pt will report 50% decrease in fear of falling. Baseline: "afraid to come outside my box" Goal status: INITIAL  4.  Pt will improve on 5 X STS test to <or= 10s to demonstrate improving functional lower extremity strength, transitional movements, and balance Baseline: 13.80 Goal status: INITIAL   LONG TERM GOALS: Target date: 06/04/23  Pt will improve on ODI by 25% to demonstrate improved function ability "to do more". Baseline: 62% Goal status: INITIAL  2.  Pt will report running vacuum cleaner (Complete house work) without limitation to fatigue or pain Baseline:  Goal status: INITIAL  3.  Pt to be able to take trash can to and from curb safely Baseline:  Goal status: INITIAL  4.  Pt will report decreased waking from cramps at night to 1x week or less Baseline: 3 Goal status: INITIAL  5.  Pt will improve strength in all areas listed by at least 10 lbs  to demonstrate improved overall physical function Baseline: see chart Goal status: INITIAL  6.   Pt will report ability to complete a "full workout" (completing cardio/ lifting)  Baseline:  Goal status: INITIAL  PLAN:  PT FREQUENCY: 2x/week  PT DURATION: 8 weeks  PLANNED INTERVENTIONS: 97164- PT Re-evaluation, 97110-Therapeutic exercises, 97530- Therapeutic  activity, 97112- Neuromuscular re-education, 97535- Self Care, 40102- Manual therapy, [id number]- Gait training, [phone]- Orthotic Fit/training, [phone]- Aquatic Therapy, 97014- Electrical stimulation (unattended), [phone]- Ionotophoresis 4mg /ml Dexamethasone, Patient/Family education, Balance training, Stair training, Taping, Dry Needling, Joint mobilization, DME instructions, Cryotherapy, and Moist heat.  PLAN FOR NEXT SESSION: aquatics: general strengthening; balance retraining HEP   Rushie Chestnut) Johnney Scarlata MPT 05/11/23 11:11 AM [facility] [address] [place], [place], [address] Phone: [phone]   Fax:  [phone]

## 2023-05-12 ENCOUNTER — Telehealth: Payer: Self-pay | Admitting: Neurology

## 2023-05-12 NOTE — Telephone Encounter (Signed)
 Pt would like a call to discuss an increase in her  gabapentin (NEURONTIN) 100 MG capsule, pt would like to increase from 100mg  to 200mg , please call pt to discuss.

## 2023-05-12 NOTE — Telephone Encounter (Signed)
 I called pt.  She has had PT/aqua therapy started 05/05/2023 was fantastic, but feels it really worked her and had increase pain SI joing and cramps.  She went ahead and increased 05/06/2023 gabapentin to 200mg  po at bedtime.  This has helped with pain, and helped her to sleep/rest comfortably.  She wanted to let you know this and if was ok would need new prescription relating dose increase.

## 2023-05-13 ENCOUNTER — Other Ambulatory Visit: Payer: Self-pay | Admitting: Neurology

## 2023-05-13 ENCOUNTER — Ambulatory Visit (HOSPITAL_BASED_OUTPATIENT_CLINIC_OR_DEPARTMENT_OTHER): Payer: Medicare Other | Admitting: Physical Therapy

## 2023-05-13 ENCOUNTER — Ambulatory Visit: Payer: Medicare Other | Admitting: Neurology

## 2023-05-13 MED ORDER — GABAPENTIN 100 MG PO CAPS: 200.0000 mg | ORAL_CAPSULE | Freq: Every day | ORAL | 3 refills | Status: AC

## 2023-05-13 NOTE — Telephone Encounter (Signed)
 I called the patient and let her know that Dr Lucia Gaskins sent in Gabapentin 200 mg prescription to her pharmacy, using 100 mg capsules. Pt can go ahead and increase dose until she can fill again. [facility] on [address] has Rx. She thanked me for the call.

## 2023-05-13 NOTE — Telephone Encounter (Signed)
 Done, thank you!

## 2023-05-14 ENCOUNTER — Ambulatory Visit
Admission: RE | Admit: 2023-05-14 | Discharge: 2023-05-14 | Disposition: A | Discharge status: Home / Self Care | Payer: Medicare Other | Source: Ambulatory Visit | Attending: Internal Medicine | Admitting: Internal Medicine

## 2023-05-14 DIAGNOSIS — Z1231 Encounter for screening mammogram for malignant neoplasm of breast: Secondary | ICD-10-CM

## 2023-05-18 ENCOUNTER — Ambulatory Visit (HOSPITAL_BASED_OUTPATIENT_CLINIC_OR_DEPARTMENT_OTHER): Payer: Medicare Other | Admitting: Physical Therapy

## 2023-05-18 ENCOUNTER — Encounter (HOSPITAL_BASED_OUTPATIENT_CLINIC_OR_DEPARTMENT_OTHER): Payer: Self-pay | Admitting: Physical Therapy

## 2023-05-18 DIAGNOSIS — M5459 Other low back pain: Secondary | ICD-10-CM | POA: Diagnosis not present

## 2023-05-18 DIAGNOSIS — R2689 Other abnormalities of gait and mobility: Secondary | ICD-10-CM

## 2023-05-18 DIAGNOSIS — M6281 Muscle weakness (generalized): Secondary | ICD-10-CM | POA: Diagnosis not present

## 2023-05-18 NOTE — Therapy (Signed)
 OUTPATIENT PHYSICAL THERAPY THORACOLUMBAR TREATMENT   Patient Name: Julie Tapia MRN: [id number] DOB:[birth year], 73 y.o., female Today's Date: 05/18/2023  END OF SESSION:  PT End of Session - 05/18/23 1059     Visit Number 5    Date for PT Re-Evaluation 06/04/23    Authorization Type mcr    Progress Note Due on Visit 10    PT Start Time 1059    PT Stop Time 1137    PT Time Calculation (min) 38 min    Behavior During Therapy [facility] for tasks assessed/performed              Past Medical History:  Diagnosis Date   Aneurysm (HCC) 01-16-11   "lt .side of head not intra(cavity area)/Found during series of tests to r/o TIA's"-report from [facility] showed  Lt. ICA  aneurysm   Arthritis 01-16-11   osteoarthritis,-knees, hips, ankles. 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 tight chested-"no inhalers in yrs.   Complication of anesthesia 01-16-11   Pt. very slow to awaken and was days before feeling like herself.   Past Surgical History:  Procedure Laterality Date   ABDOMINAL HYSTERECTOMY     BREAST CYST EXCISION  01-16-11   several excisions in the past   JOINT REPLACEMENT  01-16-11   Rt. great toe ball joint replacement-metal   REDUCTION MAMMAPLASTY  01-16-11   Bil. '86( problems with s/p surgical clot at rt. breast drainage site)   TOTAL HIP ARTHROPLASTY  01/20/2011   Procedure: TOTAL HIP ARTHROPLASTY ANTERIOR APPROACH;  Surgeon: Shelda Pal;  Location: [facility];  Service: Orthopedics;  Laterality: Right;   Patient Active Problem List   Diagnosis Date Noted   Cerebral aneurysm 01/12/2021   PFO (patent foramen ovale) 01/12/2021    PCP: Alysia Penna MD  REFERRING PROVIDER:   Windle Guard, MD    REFERRING DIAG: M54.16 (ICD-10-CM) - Radiculopathy, lumbar region   Rationale for Evaluation and Treatment: Rehabilitation  THERAPY DIAG:  Muscle weakness (generalized)  Other abnormalities of  gait and mobility  Other low back pain  ONSET DATE: last year  SUBJECTIVE:                                                                                                                                                                                           SUBJECTIVE STATEMENT: Pt reports she didn't have any cramps last night; first time in a long time.  She has decided to get an injection tomorrow in SI joint.    Initial Subjective Began/Back injection 2007-8.  Went dormant.  Returned 2020.  Summer 2024 hurt my back lifting case of water.  Have had back pain continual since. Drucie Ip has given injections in lumbar spine and L SIJ. Fasciculation/cramps were bad in my calves.  Better with gabapentin low dose.  Most nights I can sleep (2-3 x week not sleeping). I have atrophied a lot.  Have a treadmill that I walk on it 5/7 days a week 6000-10000 step.  I immediately lay on mat doing exercises I was shown form other PT in past after finishing on bike. May need to have my left hip replaced pain and stiffness there.  I feel stiff in my back and hip. Weakness has been significantly decreasing since the summer. Tested for ALS neg  PERTINENT HISTORY:  R THR  PAIN:  Are you having pain? Yes: NPRS scale: current 1/10 Pain location: LB; radiates up spine; anterior medial thigh to knee and calf Pain description: cramping; ache; pressure Aggravating factors: walking on treadmill Relieving factors: rest/ stretching, meds  PRECAUTIONS: None  RED FLAGS: None   WEIGHT BEARING RESTRICTIONS: No  FALLS:  Has patient fallen in last 6 months? No  LIVING ENVIRONMENT: Lives with: lives alone Lives in: House/apartment Stairs: Yes: External: 8 steps; on right going up Has following equipment at home: None  OCCUPATION: retired last year;  PLOF: Independent  PATIENT GOALS: Increase strength, improve overall health, balance to do more  NEXT MD VISIT: end of March  OBJECTIVE:  Note: Objective  measures were completed at Evaluation unless otherwise noted.  DIAGNOSTIC FINDINGS:  MRI lumbar spine dated October 08, 2018 shows type II lumbosacral transitional vertebrae. At L2-3 there is moderate left and mild to moderate right neural foraminal narrowing with contact of the exiting bilateral L2 nerve roots worse on the left than the right. There is moderate left subarticular zone narrowing with mild spinal canal stenosis impinging upon with mild to moderate displacement of the left L3 nerve root due to a very small central disc extrusion superimposed upon a severe bulging disc osteophyte complex asymmetric to the left. At L3-4 there is a disc extrusion superimposed upon a severe left lateral bulging disc osteophyte and severe left disc height loss resulting in moderate to severe left foraminal narrowing and impingement upon the exiting left L3 nerve root. Moderate left and mild right subarticular zone narrowing and mild spinal canal stenosis with impingement upon the descending left L4 nerve root. At L4-5 there is moderate to severe left and moderate right facet arthrosis. Mild dextroconvex lumbar scoliosis with apex at L3.   PATIENT SURVEYS:  ODI  14(2)=28/45= 62%  COGNITION: Overall cognitive status: Within functional limits for tasks assessed     SENSATION: dysesthesias posterior calf pain and pain in the sole of her foot  MUSCLE LENGTH: Hamstrings: tight   POSTURE: rounded shoulders and increased thoracic kyphosis   LUMBAR ROM:   wfl  LOWER EXTREMITY ROM:     wfl  LOWER EXTREMITY MMT:    MMT Right eval Left eval  Hip flexion 39.5 29.8  Hip extension    Hip abduction 13.9 18.0  Hip adduction    Hip internal rotation    Hip external rotation    Knee flexion    Knee extension 26.4 22.6  Ankle dorsiflexion    Ankle plantarflexion    Ankle inversion    Ankle eversion     (Blank rows = not tested)  LUMBAR SPECIAL TESTS:  Straight leg raise test: Negative and Slump  test:  Negative  FUNCTIONAL TESTS:  5 times sit to stand: 13.80 Timed up and go (TUG): 10.79 4 stage: passed 1&2.  Tandem x 5s; SLS x 7 05/18/23:  5x STS:  10.5s  GAIT: Distance walked: unlimited distance Assistive device utilized: None Level of assistance: Complete Independence Comments: wfl  TREATMENT                                                                                                                          [facility] Adult PT Treatment:                                                DATE: 05/18/23 Pt seen for aquatic therapy today.  Treatment took place in water 3.5-4.75 ft in depth at the [facility] pool. Temp of water was 91.  Pt entered/exited the pool via stairs with hand rail.   *walking forward and back unsupported * wide stance with arm swing with light resistance bells x 10 -> with forward walking x 2 laps *Unsupported: side stepping with arm addct/ abdct with light resistance bells  *UEsupport wall: heel raises x 10;  hip add/abd x 10; hip circles (outward only) x 10 each * return to walking backward/ forward with arm swing * TrA set with half hollow noodle to thighs in wide stance then staggered stance * UE on wall: alternating single leg clams x 10 each  *  straddling noodle and cycling, UE on wall   PATIENT EDUCATION:  Education details: aquatic therapy progression / modifications Person educated: Patient Education method: Explanation Education comprehension: verbalized understanding  HOME EXERCISE PROGRAM: Pt follows indep  ASSESSMENT:  CLINICAL IMPRESSION: Good tolerance for all aquatic exercises; no increase in symptoms.  She required occasional cues for posture and form.  Encouraged pt to bring towel or lumbar pillow to church for back support during service. Pt has met STG 1 and 4. She will benefit from skilled PT intervention to improve all areas of deficits and return pt to improved QOL.     Initial impression Patient is a 73 y.o. f  who was seen today for physical therapy evaluation and treatment for Radiculopathy, lumbar region. She reports dysesthesias posterior calf pain and pain in the sole of her foot with associated significant calf cramping which has subsided since taking gabapentin.  Steroid injections in lumbar spine and r SIJ has significantly decreased LBP but she continues to have episodes of radicular discomfort in right anteromedial thigh to knee she reports as tightness and tingling.  She is an active senior but has become afraid of falling (has not had any falls) due to an increase in general weakness and reports of general muscle atrophy. She does follow her own exercise program walking on treadmill x 40 mins + 5 days a week but does not do other things she  enjoys doing outside her home due to fear of falling. Objective and functional testing demonstrate LE and core weakness.   She will benefit from skilled PT intervention to improve all areas of deficits and return pt to improved QOL. Will begin in aquatics then transition land based when approp.    OBJECTIVE IMPAIRMENTS: decreased activity tolerance, decreased balance, decreased endurance, decreased mobility, difficulty walking, and decreased strength.   ACTIVITY LIMITATIONS: carrying, lifting, bending, standing, squatting, stairs, transfers, and locomotion level  PARTICIPATION LIMITATIONS: cleaning, community activity, occupation, and yard work  PERSONAL FACTORS: Time since onset of injury/illness/exacerbation are also affecting patient's functional outcome.   REHAB POTENTIAL: Good  CLINICAL DECISION MAKING: Evolving/moderate complexity  EVALUATION COMPLEXITY: Moderate   GOALS: Goals reviewed with patient? Yes  SHORT TERM GOALS: Target date: 05/10/23  Pt will tolerate full aquatic sessions consistently without increase in pain and with improving function to demonstrate good toleration and effectiveness of intervention.  Baseline: Goal status: MET -  05/18/23  2. Pt will perform SLS and tandem stance in 3.6 ft unsupported x 20s Baseline: see chart Goal INITIAL  3.Pt will report 50% decrease in fear of falling. Baseline: "afraid to come outside my box" Goal status: IN PROGRESS - 05/18/23  4.  Pt will improve on 5 X STS test to <or= 10s to demonstrate improving functional lower extremity strength, transitional movements, and balance Baseline: 13.80 at eval;  see above Goal status: Met- 05/18/23   LONG TERM GOALS: Target date: 06/04/23  Pt will improve on ODI by 25% to demonstrate improved function ability "to do more". Baseline: 62% Goal status: INITIAL  2.  Pt will report running vacuum cleaner (Complete house work) without limitation to fatigue or pain Baseline:  Goal status: INITIAL  3.  Pt to be able to take trash can to and from curb safely Baseline:  Goal status: INITIAL  4.  Pt will report decreased waking from cramps at night to 1x week or less Baseline: 3 Goal status: INITIAL  5.  Pt will improve strength in all areas listed by at least 10 lbs  to demonstrate improved overall physical function Baseline: see chart Goal status: INITIAL  6.   Pt will report ability to complete a "full workout" (completing cardio/ lifting)  Baseline:  Goal status: INITIAL  PLAN:  PT FREQUENCY: 2x/week  PT DURATION: 8 weeks  PLANNED INTERVENTIONS: 97164- PT Re-evaluation, 97110-Therapeutic exercises, 97530- Therapeutic activity, 97112- Neuromuscular re-education, 97535- Self Care, 19147- Manual therapy, [id number]- Gait training, [phone]- Orthotic Fit/training, [phone]- Aquatic Therapy, 97014- Electrical stimulation (unattended), [phone]- Ionotophoresis 4mg /ml Dexamethasone, Patient/Family education, Balance training, Stair training, Taping, Dry Needling, Joint mobilization, DME instructions, Cryotherapy, and Moist heat.  PLAN FOR NEXT SESSION: aquatics: general strengthening; balance retraining HEP  Mayer Camel, PTA 05/18/23 11:41  AM [facility] [address] [place], [place], [address] Phone: [phone]   Fax:  [phone]

## 2023-05-19 DIAGNOSIS — M533 Sacrococcygeal disorders, not elsewhere classified: Secondary | ICD-10-CM | POA: Diagnosis not present

## 2023-05-20 ENCOUNTER — Ambulatory Visit (HOSPITAL_BASED_OUTPATIENT_CLINIC_OR_DEPARTMENT_OTHER): Payer: Medicare Other | Admitting: Physical Therapy

## 2023-05-20 ENCOUNTER — Encounter (HOSPITAL_BASED_OUTPATIENT_CLINIC_OR_DEPARTMENT_OTHER): Payer: Self-pay | Admitting: Physical Therapy

## 2023-05-20 DIAGNOSIS — M6281 Muscle weakness (generalized): Secondary | ICD-10-CM

## 2023-05-20 DIAGNOSIS — M5459 Other low back pain: Secondary | ICD-10-CM | POA: Diagnosis not present

## 2023-05-20 DIAGNOSIS — R2689 Other abnormalities of gait and mobility: Secondary | ICD-10-CM | POA: Diagnosis not present

## 2023-05-20 NOTE — Therapy (Signed)
 OUTPATIENT PHYSICAL THERAPY THORACOLUMBAR TREATMENT   Patient Name: Julie Tapia MRN: [id number] DOB:26-Feb-1951, 73 y.o., female Today's Date: 05/20/2023  END OF SESSION:  PT End of Session - 05/20/23 1023     Visit Number 6    Date for PT Re-Evaluation 06/04/23    Authorization Type mcr    Progress Note Due on Visit 10    PT Start Time 1018    PT Stop Time 1056    PT Time Calculation (min) 38 min    Behavior During Therapy [facility] for tasks assessed/performed              Past Medical History:  Diagnosis Date   Aneurysm (HCC) 01-16-11   "lt .side of head not intra(cavity area)/Found during series of tests to r/o TIA's"-report from [facility] showed  Lt. ICA  aneurysm   Arthritis 01-16-11   osteoarthritis,-knees, hips, ankles. 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 tight chested-"no inhalers in yrs.   Complication of anesthesia 01-16-11   Pt. very slow to awaken and was days before feeling like herself.   Past Surgical History:  Procedure Laterality Date   ABDOMINAL HYSTERECTOMY     BREAST CYST EXCISION  01-16-11   several excisions in the past   JOINT REPLACEMENT  01-16-11   Rt. great toe ball joint replacement-metal   REDUCTION MAMMAPLASTY  01-16-11   Bil. '86( problems with s/p surgical clot at rt. breast drainage site)   TOTAL HIP ARTHROPLASTY  01/20/2011   Procedure: TOTAL HIP ARTHROPLASTY ANTERIOR APPROACH;  Surgeon: Shelda Pal;  Location: [facility];  Service: Orthopedics;  Laterality: Right;   Patient Active Problem List   Diagnosis Date Noted   Cerebral aneurysm 01/12/2021   PFO (patent foramen ovale) 01/12/2021    PCP: Alysia Penna MD  REFERRING PROVIDER:   Windle Guard, MD    REFERRING DIAG: M54.16 (ICD-10-CM) - Radiculopathy, lumbar region   Rationale for Evaluation and Treatment: Rehabilitation  THERAPY DIAG:  Muscle weakness (generalized)  Other abnormalities of  gait and mobility  Other low back pain  ONSET DATE: last year  SUBJECTIVE:                                                                                                                                                                                           SUBJECTIVE STATEMENT: Pt reports she felt great after last session; slept well.  Had injection yesterday; didn't sleep well last night.   POOL ACCESS: none currently in winter; given list of pools today.  Initial Subjective Began/Back injection 2007-8.  Went dormant.  Returned 2020.  Summer 2024 hurt my back lifting case of water.  Have had back pain continual since. Drucie Ip has given injections in lumbar spine and L SIJ. Fasciculation/cramps were bad in my calves.  Better with gabapentin low dose.  Most nights I can sleep (2-3 x week not sleeping). I have atrophied a lot.  Have a treadmill that I walk on it 5/7 days a week 6000-10000 step.  I immediately lay on mat doing exercises I was shown form other PT in past after finishing on bike. May need to have my left hip replaced pain and stiffness there.  I feel stiff in my back and hip. Weakness has been significantly decreasing since the summer. Tested for ALS neg  PERTINENT HISTORY:  R THR  PAIN:  Are you having pain? Yes: NPRS scale: current 2/10 Pain location: LB; bilat lower lateral legs Pain description: cramping; ache; burning Aggravating factors: walking on treadmill Relieving factors: rest/ stretching, meds  PRECAUTIONS: None  RED FLAGS: None   WEIGHT BEARING RESTRICTIONS: No  FALLS:  Has patient fallen in last 6 months? No  LIVING ENVIRONMENT: Lives with: lives alone Lives in: House/apartment Stairs: Yes: External: 8 steps; on right going up Has following equipment at home: None  OCCUPATION: retired last year;  PLOF: Independent  PATIENT GOALS: Increase strength, improve overall health, balance to do more  NEXT MD VISIT: end of March  OBJECTIVE:   Note: Objective measures were completed at Evaluation unless otherwise noted.  DIAGNOSTIC FINDINGS:  MRI lumbar spine dated October 08, 2018 shows type II lumbosacral transitional vertebrae. At L2-3 there is moderate left and mild to moderate right neural foraminal narrowing with contact of the exiting bilateral L2 nerve roots worse on the left than the right. There is moderate left subarticular zone narrowing with mild spinal canal stenosis impinging upon with mild to moderate displacement of the left L3 nerve root due to a very small central disc extrusion superimposed upon a severe bulging disc osteophyte complex asymmetric to the left. At L3-4 there is a disc extrusion superimposed upon a severe left lateral bulging disc osteophyte and severe left disc height loss resulting in moderate to severe left foraminal narrowing and impingement upon the exiting left L3 nerve root. Moderate left and mild right subarticular zone narrowing and mild spinal canal stenosis with impingement upon the descending left L4 nerve root. At L4-5 there is moderate to severe left and moderate right facet arthrosis. Mild dextroconvex lumbar scoliosis with apex at L3.   PATIENT SURVEYS:  ODI  14(2)=28/45= 62%  COGNITION: Overall cognitive status: Within functional limits for tasks assessed     SENSATION: dysesthesias posterior calf pain and pain in the sole of her foot  MUSCLE LENGTH: Hamstrings: tight   POSTURE: rounded shoulders and increased thoracic kyphosis   LUMBAR ROM:   wfl  LOWER EXTREMITY ROM:     wfl  LOWER EXTREMITY MMT:    MMT Right eval Left eval  Hip flexion 39.5 29.8  Hip extension    Hip abduction 13.9 18.0  Hip adduction    Hip internal rotation    Hip external rotation    Knee flexion    Knee extension 26.4 22.6  Ankle dorsiflexion    Ankle plantarflexion    Ankle inversion    Ankle eversion     (Blank rows = not tested)  LUMBAR SPECIAL TESTS:  Straight leg raise test:  Negative and Slump test:  Negative  FUNCTIONAL TESTS:  5 times sit to stand: 13.80 Timed up and go (TUG): 10.79 4 stage: passed 1&2.  Tandem x 5s; SLS x 7 05/18/23:  5x STS:  10.5s  GAIT: Distance walked: unlimited distance Assistive device utilized: None Level of assistance: Complete Independence Comments: wfl  TREATMENT                                                                                                                          [facility] Adult PT Treatment:                                                DATE: 05/20/23 Pt seen for aquatic therapy today.  Treatment took place in water 3.5-4.75 ft in depth at the [facility] pool. Temp of water was 91.  Pt entered/exited the pool via stairs with hand rail.   *walking forward and back unsupported, 3 laps *Unsupported: side stepping with arm addct/ abdct -> with light resistance bells  * wide stance with reciprocal arm swing with light resistance bells x 10 -> with forward walking x 1 lap (difficulty with coordination) * staggered stance with bilat horiz abdct/ addct with light resistance bells *UE support wall: heel raises and squats x 10;  hip add/abd x 10; hip circles (outward only) x 10 each * return to walking backward/ forward with arm swing * TrA set with half hollow noodle to thighs in wide stance then staggered stance, stopping for isometric holds at various levels on return to surface * tricep push downs with rainbow-> yellow hand floats x 10 each *  straddling noodle UE on wall : cycling, hip abdct/ addct, cycling  * once dried off: sensitive skin Rock tape applied in X pattern over injection site. Instructed on safe removal technique; verbalized understanding.  PATIENT EDUCATION:  Education details: aquatic therapy progression / modifications Person educated: Patient Education method: Explanation Education comprehension: verbalized understanding  HOME EXERCISE PROGRAM: Pt follows  indep  ASSESSMENT:  CLINICAL IMPRESSION: Good tolerance for all aquatic exercises; no increase in symptoms.  Improved posture during session. Some difficulty with coordination with reciprocal arm swing.  She will benefit from skilled PT intervention to improve all areas of deficits and return pt to improved QOL. Making good gains towards goals. Ck SLS / tandem stance (STG) in shallow water next visit.     Initial impression Patient is a 73 y.o. f who was seen today for physical therapy evaluation and treatment for Radiculopathy, lumbar region. She reports dysesthesias posterior calf pain and pain in the sole of her foot with associated significant calf cramping which has subsided since taking gabapentin.  Steroid injections in lumbar spine and r SIJ has significantly decreased LBP but she continues to have episodes of radicular discomfort in right anteromedial thigh to knee she reports as tightness  and tingling.  She is an active senior but has become afraid of falling (has not had any falls) due to an increase in general weakness and reports of general muscle atrophy. She does follow her own exercise program walking on treadmill x 40 mins + 5 days a week but does not do other things she enjoys doing outside her home due to fear of falling. Objective and functional testing demonstrate LE and core weakness.   She will benefit from skilled PT intervention to improve all areas of deficits and return pt to improved QOL. Will begin in aquatics then transition land based when approp.    OBJECTIVE IMPAIRMENTS: decreased activity tolerance, decreased balance, decreased endurance, decreased mobility, difficulty walking, and decreased strength.   ACTIVITY LIMITATIONS: carrying, lifting, bending, standing, squatting, stairs, transfers, and locomotion level  PARTICIPATION LIMITATIONS: cleaning, community activity, occupation, and yard work  PERSONAL FACTORS: Time since onset of injury/illness/exacerbation  are also affecting patient's functional outcome.   REHAB POTENTIAL: Good  CLINICAL DECISION MAKING: Evolving/moderate complexity  EVALUATION COMPLEXITY: Moderate   GOALS: Goals reviewed with patient? Yes  SHORT TERM GOALS: Target date: 05/10/23  Pt will tolerate full aquatic sessions consistently without increase in pain and with improving function to demonstrate good toleration and effectiveness of intervention.  Baseline: Goal status: MET - 05/18/23  2. Pt will perform SLS and tandem stance in 3.6 ft unsupported x 20s Baseline: see chart Goal INITIAL  3.Pt will report 50% decrease in fear of falling. Baseline: "afraid to come outside my box" Goal status: IN PROGRESS - 05/18/23  4.  Pt will improve on 5 X STS test to <or= 10s to demonstrate improving functional lower extremity strength, transitional movements, and balance Baseline: 13.80 at eval;  see above Goal status: Met- 05/18/23   LONG TERM GOALS: Target date: 06/04/23  Pt will improve on ODI by 25% to demonstrate improved function ability "to do more". Baseline: 62% Goal status: INITIAL  2.  Pt will report running vacuum cleaner (Complete house work) without limitation to fatigue or pain Baseline:  Goal status: INITIAL  3.  Pt to be able to take trash can to and from curb safely Baseline:  Goal status: INITIAL  4.  Pt will report decreased waking from cramps at night to 1x week or less Baseline: 3 Goal status: INITIAL  5.  Pt will improve strength in all areas listed by at least 10 lbs  to demonstrate improved overall physical function Baseline: see chart Goal status: INITIAL  6.   Pt will report ability to complete a "full workout" (completing cardio/ lifting)  Baseline:  Goal status: INITIAL  PLAN:  PT FREQUENCY: 2x/week  PT DURATION: 8 weeks  PLANNED INTERVENTIONS: 97164- PT Re-evaluation, 97110-Therapeutic exercises, 97530- Therapeutic activity, 97112- Neuromuscular re-education, 97535- Self Care,  97140- Manual therapy, [id number]- Gait training, 09811- Orthotic Fit/training, 91478- Aquatic Therapy, 97014- Electrical stimulation (unattended), [phone]- Ionotophoresis 4mg /ml Dexamethasone, Patient/Family education, Balance training, Stair training, Taping, Dry Needling, Joint mobilization, DME instructions, Cryotherapy, and Moist heat.  PLAN FOR NEXT SESSION: aquatics: general strengthening; balance retraining HEP  Mayer Camel, PTA 05/20/23 6:09 PM [facility] [address] [place], [place], [address] Phone: [phone]   Fax:  [phone]

## 2023-05-21 DIAGNOSIS — L309 Dermatitis, unspecified: Secondary | ICD-10-CM | POA: Diagnosis not present

## 2023-05-24 ENCOUNTER — Encounter (HOSPITAL_BASED_OUTPATIENT_CLINIC_OR_DEPARTMENT_OTHER): Payer: Self-pay | Admitting: Physical Therapy

## 2023-05-24 ENCOUNTER — Ambulatory Visit (HOSPITAL_BASED_OUTPATIENT_CLINIC_OR_DEPARTMENT_OTHER): Payer: Medicare Other | Admitting: Physical Therapy

## 2023-05-24 DIAGNOSIS — M5459 Other low back pain: Secondary | ICD-10-CM | POA: Diagnosis not present

## 2023-05-24 DIAGNOSIS — M6281 Muscle weakness (generalized): Secondary | ICD-10-CM

## 2023-05-24 DIAGNOSIS — R2689 Other abnormalities of gait and mobility: Secondary | ICD-10-CM | POA: Diagnosis not present

## 2023-05-24 NOTE — Therapy (Signed)
 OUTPATIENT PHYSICAL THERAPY THORACOLUMBAR TREATMENT   Patient Name: Julie Tapia MRN: [id number] DOB:March 01, 1951, 73 y.o., female Today's Date: 05/24/2023  END OF SESSION:  PT End of Session - 05/24/23 1145     Visit Number 7    Date for PT Re-Evaluation 06/04/23    Authorization Type mcr    Progress Note Due on Visit 10    PT Start Time 1146    PT Stop Time 1225    PT Time Calculation (min) 39 min    Activity Tolerance Patient tolerated treatment well    Behavior During Therapy [facility] for tasks assessed/performed              Past Medical History:  Diagnosis Date   Aneurysm (HCC) 01-16-11   "lt .side of head not intra(cavity area)/Found during series of tests to r/o TIA's"-report from [facility] showed  Lt. ICA  aneurysm   Arthritis 01-16-11   osteoarthritis,-knees, hips, ankles. 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 tight chested-"no inhalers in yrs.   Complication of anesthesia 01-16-11   Pt. very slow to awaken and was days before feeling like herself.   Past Surgical History:  Procedure Laterality Date   ABDOMINAL HYSTERECTOMY     BREAST CYST EXCISION  01-16-11   several excisions in the past   JOINT REPLACEMENT  01-16-11   Rt. great toe ball joint replacement-metal   REDUCTION MAMMAPLASTY  01-16-11   Bil. '86( problems with s/p surgical clot at rt. breast drainage site)   TOTAL HIP ARTHROPLASTY  01/20/2011   Procedure: TOTAL HIP ARTHROPLASTY ANTERIOR APPROACH;  Surgeon: Shelda Pal;  Location: [facility];  Service: Orthopedics;  Laterality: Right;   Patient Active Problem List   Diagnosis Date Noted   Cerebral aneurysm 01/12/2021   PFO (patent foramen ovale) 01/12/2021    PCP: Alysia Penna MD  REFERRING PROVIDER:   Windle Guard, MD    REFERRING DIAG: M54.16 (ICD-10-CM) - Radiculopathy, lumbar region   Rationale for Evaluation and Treatment: Rehabilitation  THERAPY DIAG:   Muscle weakness (generalized)  Other abnormalities of gait and mobility  Other low back pain  ONSET DATE: last year  SUBJECTIVE:                                                                                                                                                                                           SUBJECTIVE STATEMENT: Pt reports she felt great after last session; slept well.  Had injection yesterday; didn't sleep well last night.   POOL ACCESS: none currently in  winter; given list of pools today.    Initial Subjective Began/Back injection 2007-8.  Went dormant.  Returned 2020.  Summer 2024 hurt my back lifting case of water.  Have had back pain continual since. Drucie Ip has given injections in lumbar spine and L SIJ. Fasciculation/cramps were bad in my calves.  Better with gabapentin low dose.  Most nights I can sleep (2-3 x week not sleeping). I have atrophied a lot.  Have a treadmill that I walk on it 5/7 days a week 6000-10000 step.  I immediately lay on mat doing exercises I was shown form other PT in past after finishing on bike. May need to have my left hip replaced pain and stiffness there.  I feel stiff in my back and hip. Weakness has been significantly decreasing since the summer. Tested for ALS neg  PERTINENT HISTORY:  R THR  PAIN:  Are you having pain? Yes: NPRS scale: current 2/10 Pain location: LB; bilat lower lateral legs Pain description: cramping; ache; burning Aggravating factors: walking on treadmill Relieving factors: rest/ stretching, meds  PRECAUTIONS: None  RED FLAGS: None   WEIGHT BEARING RESTRICTIONS: No  FALLS:  Has patient fallen in last 6 months? No  LIVING ENVIRONMENT: Lives with: lives alone Lives in: House/apartment Stairs: Yes: External: 8 steps; on right going up Has following equipment at home: None  OCCUPATION: retired last year;  PLOF: Independent  PATIENT GOALS: Increase strength, improve overall health, balance to  do more  NEXT MD VISIT: end of March  OBJECTIVE:  Note: Objective measures were completed at Evaluation unless otherwise noted.  DIAGNOSTIC FINDINGS:  MRI lumbar spine dated October 08, 2018 shows type II lumbosacral transitional vertebrae. At L2-3 there is moderate left and mild to moderate right neural foraminal narrowing with contact of the exiting bilateral L2 nerve roots worse on the left than the right. There is moderate left subarticular zone narrowing with mild spinal canal stenosis impinging upon with mild to moderate displacement of the left L3 nerve root due to a very small central disc extrusion superimposed upon a severe bulging disc osteophyte complex asymmetric to the left. At L3-4 there is a disc extrusion superimposed upon a severe left lateral bulging disc osteophyte and severe left disc height loss resulting in moderate to severe left foraminal narrowing and impingement upon the exiting left L3 nerve root. Moderate left and mild right subarticular zone narrowing and mild spinal canal stenosis with impingement upon the descending left L4 nerve root. At L4-5 there is moderate to severe left and moderate right facet arthrosis. Mild dextroconvex lumbar scoliosis with apex at L3.   PATIENT SURVEYS:  ODI  14(2)=28/45= 62%  COGNITION: Overall cognitive status: Within functional limits for tasks assessed     SENSATION: dysesthesias posterior calf pain and pain in the sole of her foot  MUSCLE LENGTH: Hamstrings: tight   POSTURE: rounded shoulders and increased thoracic kyphosis   LUMBAR ROM:   wfl  LOWER EXTREMITY ROM:     wfl  LOWER EXTREMITY MMT:    MMT Right eval Left eval  Hip flexion 39.5 29.8  Hip extension    Hip abduction 13.9 18.0  Hip adduction    Hip internal rotation    Hip external rotation    Knee flexion    Knee extension 26.4 22.6  Ankle dorsiflexion    Ankle plantarflexion    Ankle inversion    Ankle eversion     (Blank rows = not  tested)  LUMBAR SPECIAL TESTS:  Straight leg raise test: Negative and Slump test: Negative  FUNCTIONAL TESTS:  5 times sit to stand: 13.80 Timed up and go (TUG): 10.79 4 stage: passed 1&2.  Tandem x 5s; SLS x 7 05/18/23:  5x STS:  10.5s  GAIT: Distance walked: unlimited distance Assistive device utilized: None Level of assistance: Complete Independence Comments: wfl  TREATMENT                                                                                                                          [facility] Adult PT Treatment:                                                DATE: 05/24/23 Self care: discussion and instruction on dx of Type II Lumbosacral transitional vertebrae. Pictures shown; Pt to bring in most recent MRI completed at [facility] (not in epic)  Pt seen for aquatic therapy today.  Treatment took place in water 3.5-4.75 ft in depth at the [facility] pool. Temp of water was 91.  Pt entered/exited the pool via stairs with hand rail.   *walking forward and back unsupported, 3 laps *Unsupported: side stepping with arm addct/ abdct -> with light resistance bells  * wide stance with reciprocal arm swing with light resistance bells x 10 -> with forward walking x 1 lap (difficulty with coordination) * staggered stance with bilat horiz abdct/ addct with light resistance bells * return to walking backward/ forward with arm swing *  straddling noodle UE on wall : cycling, hip abdct/ addct, cycling    PATIENT EDUCATION:  Education details: aquatic therapy progression / modifications Person educated: Patient Education method: Explanation Education comprehension: verbalized understanding  HOME EXERCISE PROGRAM: Pt follows indep  ASSESSMENT:  CLINICAL IMPRESSION: Pt with spike in pain last week after receiving steroid injection.  10/10 pain for days. Today resolved some 2/10.  Terrible cramping in calves and toes.  Time spent today education pt on dx and potential  Implications.  She reports last most recent MRI showed scoliosis as well which is not in Epic.  She does have some left calf cramping with cycling today. Pt to become member [facility] at [facility] for pool access.  Plan to re-assess in next 1-2 weeks.      Initial impression Patient is a 73 y.o. f who was seen today for physical therapy evaluation and treatment for Radiculopathy, lumbar region. She reports dysesthesias posterior calf pain and pain in the sole of her foot with associated significant calf cramping which has subsided since taking gabapentin.  Steroid injections in lumbar spine and r SIJ has significantly decreased LBP but she continues to have episodes of radicular discomfort in right anteromedial thigh to knee she reports as tightness and tingling.  She is an active senior but has become afraid of falling (has not had any  falls) due to an increase in general weakness and reports of general muscle atrophy. She does follow her own exercise program walking on treadmill x 40 mins + 5 days a week but does not do other things she enjoys doing outside her home due to fear of falling. Objective and functional testing demonstrate LE and core weakness.   She will benefit from skilled PT intervention to improve all areas of deficits and return pt to improved QOL. Will begin in aquatics then transition land based when approp.    OBJECTIVE IMPAIRMENTS: decreased activity tolerance, decreased balance, decreased endurance, decreased mobility, difficulty walking, and decreased strength.   ACTIVITY LIMITATIONS: carrying, lifting, bending, standing, squatting, stairs, transfers, and locomotion level  PARTICIPATION LIMITATIONS: cleaning, community activity, occupation, and yard work  PERSONAL FACTORS: Time since onset of injury/illness/exacerbation are also affecting patient's functional outcome.   REHAB POTENTIAL: Good  CLINICAL DECISION MAKING: Evolving/moderate complexity  EVALUATION COMPLEXITY:  Moderate   GOALS: Goals reviewed with patient? Yes  SHORT TERM GOALS: Target date: 05/10/23  Pt will tolerate full aquatic sessions consistently without increase in pain and with improving function to demonstrate good toleration and effectiveness of intervention.  Baseline: Goal status: MET - 05/18/23  2. Pt will perform SLS and tandem stance in 3.6 ft unsupported x 20s Baseline: see chart Goal INITIAL  3.Pt will report 50% decrease in fear of falling. Baseline: "afraid to come outside my box" Goal status: IN PROGRESS - 05/18/23  4.  Pt will improve on 5 X STS test to <or= 10s to demonstrate improving functional lower extremity strength, transitional movements, and balance Baseline: 13.80 at eval;  see above Goal status: Met- 05/18/23   LONG TERM GOALS: Target date: 06/04/23  Pt will improve on ODI by 25% to demonstrate improved function ability "to do more". Baseline: 62% Goal status: INITIAL  2.  Pt will report running vacuum cleaner (Complete house work) without limitation to fatigue or pain Baseline:  Goal status: INITIAL  3.  Pt to be able to take trash can to and from curb safely Baseline:  Goal status: INITIAL  4.  Pt will report decreased waking from cramps at night to 1x week or less Baseline: 3 Goal status: INITIAL  5.  Pt will improve strength in all areas listed by at least 10 lbs  to demonstrate improved overall physical function Baseline: see chart Goal status: INITIAL  6.   Pt will report ability to complete a "full workout" (completing cardio/ lifting)  Baseline:  Goal status: INITIAL  PLAN:  PT FREQUENCY: 2x/week  PT DURATION: 8 weeks  PLANNED INTERVENTIONS: 97164- PT Re-evaluation, 97110-Therapeutic exercises, 97530- Therapeutic activity, 97112- Neuromuscular re-education, 97535- Self Care, 16109- Manual therapy, [id number]- Gait training, [phone]- Orthotic Fit/training, [phone]- Aquatic Therapy, 97014- Electrical stimulation (unattended), [phone]-  Ionotophoresis 4mg /ml Dexamethasone, Patient/Family education, Balance training, Stair training, Taping, Dry Needling, Joint mobilization, DME instructions, Cryotherapy, and Moist heat.  PLAN FOR NEXT SESSION: aquatics: general strengthening; balance retraining HEP  Rushie Chestnut) Alynna Hargrove MPT 05/24/23 12:44 PM [facility] [address] [place], [place], [address] Phone: [phone]   Fax:  [phone]

## 2023-05-25 ENCOUNTER — Telehealth: Payer: Self-pay

## 2023-05-25 ENCOUNTER — Ambulatory Visit (INDEPENDENT_AMBULATORY_CARE_PROVIDER_SITE_OTHER): Payer: Medicare Other | Admitting: Neurology

## 2023-05-25 ENCOUNTER — Encounter: Payer: Self-pay | Admitting: Neurology

## 2023-05-25 VITALS — BP 140/78 | HR 67 | Ht 67.0 in | Wt 133.2 lb

## 2023-05-25 DIAGNOSIS — M5441 Lumbago with sciatica, right side: Secondary | ICD-10-CM

## 2023-05-25 DIAGNOSIS — M5442 Lumbago with sciatica, left side: Secondary | ICD-10-CM | POA: Diagnosis not present

## 2023-05-25 DIAGNOSIS — R252 Cramp and spasm: Secondary | ICD-10-CM | POA: Diagnosis not present

## 2023-05-25 DIAGNOSIS — M4800 Spinal stenosis, site unspecified: Secondary | ICD-10-CM

## 2023-05-25 DIAGNOSIS — G8929 Other chronic pain: Secondary | ICD-10-CM

## 2023-05-25 DIAGNOSIS — M7918 Myalgia, other site: Secondary | ICD-10-CM | POA: Diagnosis not present

## 2023-05-25 DIAGNOSIS — R253 Fasciculation: Secondary | ICD-10-CM | POA: Diagnosis not present

## 2023-05-25 MED ORDER — LIDOCAINE 5 % EX PTCH: 2.0000 | MEDICATED_PATCH | CUTANEOUS | 0 refills | Status: AC

## 2023-05-25 MED ORDER — CYCLOBENZAPRINE HCL 5 MG PO TABS: 5.0000 mg | ORAL_TABLET | Freq: Every day | ORAL | 6 refills | Status: DC

## 2023-05-25 NOTE — Progress Notes (Addendum)
 [facility]    Provider:  Dr Lucia Gaskins Requesting Provider: Windle Guard, MD Primary Care Provider:  Alysia Penna, MD    CC:  MRI Lumbar, same (bulging/herniated disc) arthirits in back.  Muscle spasms in legs. Had some steroid inject and helped.   12-17-2022 another steroid injection. Has seen Pain Management.   05/25/2023: She is not doing any better. She is having cramps, fasciculations, she had her second SI joint injection last week, she had aqua therapy and the heat felt good but it hasn't helped. We have done everything we can, she states she hasn't heard back about the mri brain and cervical spine. Gabapentin does help. After the epidural steroid injections the cramps worsened this month. Mostly at night. We discussed we can increase the gabapentin but she would like to add a muscle relaxer, she is in a permanent state of exhaustion. I highly encouraged a pain clinic. She has not been to pain clinic. She goes to ortho for injections and she can ask if they manage medications for pain. Also discussed therapy, cognitive behavioral therapy. No side effects to the gabapentin. Next can increase flexeril to 10mg  or use baclofen instead. Or increase gabapentin at bedtime.   02/19/2023: Here with daughter. We review prior workup. We discussed she has chronic pain. She can have injections, then facet injections or other procedures, spinal cord stimulator. She is scheduled for water therapy at [facility]. Extended visit. We discussed her fasciculations, her emg/ncs did not show neuromuscular disorder, extensive blood testing was negative. Monitoring caffeine, treating anxiety, using medications like ca-channel blockers or aeds like gabapentin or propranolol. Discussed medications. Discussed channelopathies and other genetic disorders,pros and cons of genetic testing  Should be lifting weights, scared to hurt her back. Personal trainer.   Reviewed images personally with patient  and daughter: MRI Lumbar spine 09/01/2022: MRI lumbar spine:  INDICATION: Back pain.  TECHNIQUE: Sagittal and axial T1 and T2-weighted sequences were performed. Additional sagittal STIR images were performed.  COMPARISON: None available at time of dictation.  FINDINGS:  There are 5 nonrib-bearing lumbar-type vertebrae. The conus medullaris terminates at a normal level. Grade 1 retrolisthesis of L2 on L3. The vertebral body heights are maintained. Moderate/severe L2-L3 intervertebral disc height loss with moderate height loss at L1-L2 and L3-L4. No suspicious marrow signal.  L1-2: Disc bulge contributes to mild spinal canal stenosis and mild/moderate bilateral neural foraminal stenosis.  L2-3: Disc bulge contributes to mild spinal canal stenosis and along with facet arthrosis, moderate/severe bilateral neural foraminal stenosis.  L3-4: No significant spinal canal stenosis. Facet arthrosis and posterior marginal osteophytosis contributes to severe left and mild/moderate right neural foraminal stenosis.  L4-5: No significant spinal canal stenosis. Facet arthrosis contributes to moderate bilateral neural foraminal stenosis.  L5-S1: No significant spinal canal or neural foraminal stenosis.  Reviewed my prior EMG nerve conduction study with patient and daughter: EMG/NCS was performed 12/07/2022: Conclusion: Nerve conduction studies of the right leg were within normal limits.  Nerve conduction studies of the left leg showed abnormal peroneal motor and sensory conductions (but normal tibial motor and sural sensory conductions).  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  No evidence for motor neuron disease or any acute/ongoing findings; fibrillations ongoing for years without other  corresponding symptoms very likely to be benign or from prior  radicular nerve damage.  Patient complains of symptoms per HPI as well as the following symptoms: None. Pertinent negatives and positives per HPI. All others negative   HPI 12/07/2022:  Julie Tapia is a 73 y.o. female here as requested by Windle Guard, MD for diffuse fasciculations. has Cerebral aneurysm and PFO (patent foramen ovale) on their problem list. [facility] is going to [facility] Dr. Drucie Ip and he is an anesthesiologist and he has been providing injections. She states she has not fallen for several years. Her balance is off and she feels weaker. Moving is harder. Her legs are weak. She has had muscle twitches for years. And they are the same in both legs. The twitches can wake her in the night. Her calfs and legs can cramp or become stiff and it is painful she even has to vocalize. In 2020 we did not see fasciculations. She has been having low back pain for years and in [employer] in June she lifted something heavy. She has pain when she goes to bed and she may be pressing on something. The pain is in the low back. She states Dr. Drucie Ip believe she needs an SI injections this month and this past July she had ESI which helped and helped the fasciculations. She was sent here for fasciculations. She states she is confused on the diagnosis and needs a more comprehensive understanding. We reviewed images of dermatomes and myotomes on detail. We reviewed mri of the lumbar spine in detail. Answered many questions She has radiating pain down the back of the legs,  (bulging/herniated disc) arthirits in back.  Muscle spasms in legs. Had some steroid inject and helped.   12-17-2022 another steroid injection. Has seen Pain Management. No other focal neurologic deficits, associated symptoms, inciting events or modifiable factors.  Reviewed notes, labs and imaging from outside physicians, which showed:  MRi lumbar spine: FINDINGS: 08/2022 There are 5  nonrib-bearing lumbar-type vertebrae. The conus medullaris terminates at a normal level. Grade 1 retrolisthesis of L2 on L3. The vertebral body heights are maintained. Moderate/severe L2-L3 intervertebral disc height loss with moderate height loss at L1-L2 and L3-L4. No suspicious marrow signal.  L1-2: Disc bulge contributes to mild spinal canal stenosis and mild/moderate bilateral neural foraminal stenosis.  L2-3: Disc bulge contributes to mild spinal canal stenosis and along with facet arthrosis, moderate/severe bilateral neural foraminal stenosis.  L3-4: No significant spinal canal stenosis. Facet arthrosis and posterior marginal osteophytosis contributes to severe left and mild/moderate right neural foraminal stenosis.  L4-5: No significant spinal canal stenosis. Facet arthrosis contributes to moderate bilateral neural foraminal stenosis.  L5-S1: No significant spinal canal or neural foraminal stenosis.   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  EMG needle study did not show acute/ongoing denervation or chronic neurogenic changes to explain her symptoms but clinically appears radicular. Clinical and radiologic correlation recommended. Review of Systems: Patient complains of symptoms per HPI as well as the following symptoms LBP, weakness, imbalance, muscle atrophy. Pertinent negatives and positives per HPI. All others negative.   History 11/2018 reviewed: Patient is here as at the request of Glynis Smiles, PA for EMG/NCS to evaluate for lumbar radiculopathy. She has chronic low back pain, she has known about a herniated L3 disk since 2008 when she stood up and noticed severe pain but never had surgery; this led  to a series of injections( I do not have access to her MRI Lumbar Spine images). The pain  improved after injections. She has polyarthritis and has had injections in the knees as well. At the end of May she had to get an injection for an insect injury, within 2-3 days she started waking up with severe cramping in the right leg and calf and the cramping can be so severe her muscle is sore afterwards. She saw Dr. Andrey Campanile and he did not think this was related to the shot. Leg pain is progressive, she has severe spasms in the right distal leg, tingling in the calf and numbness, to the bottom of the foot. She was very active walking 30+ miles a year and has had to stop due to severe back pain and feels her back is "going to break". Also cramping in the left > right inner thighs. Left thigh symptoms are worse than right thigh symptoms. Right distal leg symptoms are worse than on the left.  Gets very tight right calf pain, muscle gets very hard mostly on the right. She saw Dr. Charlann Boxer for her hip in the past and reports full hip replacement on the right side. 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 Her hip surgery was in 2012.  Limited neurologic exam: left patella(1+) is hyporeflex compared to right patellar(2+). Right AJ(absent) is hyporeflexic compared to left AJ(trace). Weakness left hip flexion 5- with more left proximal symptoms. Right leg flexion 4+/5 and she reports more symptoms distally in the right leg. Clinical history and focused exam may point to left L3/L4 pathology and right L5/S1 but clinical and radiologic correlation is recommended.    Social History   Socioeconomic History   Marital status: Single    Spouse name: Not on file   Number of children: Not on file   Years of education: Not on file   Highest education level: Not on file  Occupational History   Not on file  Tobacco Use   Smoking status: Former    Current packs/day: 0.00    Types: Cigarettes    Quit date: 01/16/1984    Years since quitting: 39.3   Smokeless tobacco: Never  Vaping Use   Vaping  status: Never Used  Substance and Sexual Activity   Alcohol use: Yes    Alcohol/week: 1.0 standard drink of alcohol    Types: 1 Glasses of wine per week   Drug use: No   Sexual activity: Never  Other Topics Concern   Not on file  Social History Narrative   Lives alone.  One daughter.     Social Drivers of Corporate investment banker Strain: Not on file  Food Insecurity: Not on file  Transportation Needs: Not on file  Physical Activity: Not on file  Stress: Not on file  Social Connections: Unknown (08/27/2022)   Received from [facility], [facility]   Social Network    Social Network: Not on file  Intimate Partner Violence: Unknown (08/27/2022)   Received from [facility], [facility]   HITS    Physically Hurt: Not on file    Insult or Talk Down To: Not on file    Threaten Physical Harm: Not on file    Scream or Curse: Not on file    Family History  Problem Relation Age of Onset   Heart disease Mother        Pacemaker    Past Medical History:  Diagnosis Date  Aneurysm (HCC) 01-16-11   "lt .side of head not intra(cavity area)/Found during series of tests to r/o TIA's"-report from [facility] showed  Lt. ICA  aneurysm   Arthritis 01-16-11   osteoarthritis,-knees, hips, ankles. 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 tight chested-"no inhalers in yrs.   Complication of anesthesia 01-16-11   Pt. very slow to awaken and was days before feeling like herself.    Patient Active Problem List   Diagnosis Date Noted   Myofascial pain syndrome of lumbar spine 05/25/2023   Chronic bilateral low back pain with bilateral sciatica 05/25/2023   Muscle cramps 05/25/2023   Nerve pain due to spinal stenosis 05/25/2023   Benign fasciculations 05/25/2023   Cerebral aneurysm 01/12/2021   PFO (patent foramen ovale) 01/12/2021    Past Surgical History:  Procedure Laterality Date   ABDOMINAL HYSTERECTOMY      BREAST CYST EXCISION  01-16-11   several excisions in the past   JOINT REPLACEMENT  01-16-11   Rt. great toe ball joint replacement-metal   REDUCTION MAMMAPLASTY  01-16-11   Bil. '86( problems with s/p surgical clot at rt. breast drainage site)   TOTAL HIP ARTHROPLASTY  01/20/2011   Procedure: TOTAL HIP ARTHROPLASTY ANTERIOR APPROACH;  Surgeon: Shelda Pal;  Location: [facility];  Service: Orthopedics;  Laterality: Right;    Current Outpatient Medications  Medication Sig Dispense Refill   albuterol (VENTOLIN HFA) 108 (90 Base) MCG/ACT inhaler albuterol sulfate HFA 90 mcg/actuation aerosol inhaler  INL 2 PFS PO Q 6 H PRN FOR WHZ     aspirin 325 MG tablet Take 325 mg daily by mouth.     cholecalciferol (VITAMIN D) 1000 UNITS tablet Take 1,000 Units by mouth daily.       cyclobenzaprine (FLEXERIL) 5 MG tablet Take 1 tablet (5 mg total) by mouth at bedtime. 30 tablet 6   diclofenac Sodium (VOLTAREN) 1 % GEL Apply 2 g topically daily.     diphenhydrAMINE (BENADRYL) 25 mg capsule Take 25 mg by mouth every 6 (six) hours as needed. Allergies       gabapentin (NEURONTIN) 100 MG capsule Take 2 capsules (200 mg total) by mouth at bedtime. 180 capsule 3   Homeopathic Products (ARNICARE) GEL Apply 1 application topically 4 (four) times daily as needed. pain      lidocaine (LIDODERM) 5 % Place 2 patches onto the skin daily. Remove & Discard patch within 12 hours or as directed by MD 60 patch 0   Magnesium Gluconate 250 MG TABS Take by mouth.     Multiple Vitamins-Minerals (MULTIVITAMINS THER. W/MINERALS) TABS Take 1 tablet by mouth daily.       OVER THE COUNTER MEDICATION [employer] foam for back pain, uses prn     triamcinolone ointment (KENALOG) 0.5 % Apply to affected area twice daily     pravastatin (PRAVACHOL) 40 MG tablet Take 1 tablet (40 mg total) by mouth every evening. 90 tablet 3   No current facility-administered medications for this visit.    Allergies as of 05/25/2023 - Review  Complete 05/25/2023  Allergen Reaction Noted   Erythromycin Anaphylaxis 01/08/2011   Ciprofloxacin hcl Itching and Swelling 01/08/2011   Contrast media [iodinated contrast media] Itching and Swelling 01/08/2011   Lidocaine Itching and Other (See Comments) 01/08/2011   Penicillins Other (See Comments) 01/08/2011   Sulfa drugs cross reactors Itching, Swelling, and Other (See Comments) 01/08/2011  Vitals: BP (!) 140/78 (BP Location: Right Arm, Patient Position: Sitting)   Pulse 67   Ht 5\' 7"  (1.702 m)   Wt 133 lb 3.2 oz (60.4 kg)   BMI 20.86 kg/m  Last Weight:  Wt Readings from Last 1 Encounters:  05/25/23 133 lb 3.2 oz (60.4 kg)   Last Height:   Ht Readings from Last 1 Encounters:  05/25/23 5\' 7"  (1.702 m)    Physical exam: Exam: Gen: NAD, conversant      CV: No palpitations or chest pain or SOB. VS: Breathing at a normal rate. Weight normal. Not febrile. Eyes: Conjunctivae clear without exudates or hemorrhage  Neuro: Detailed Neurologic Exam  Speech:    Speech is normal; fluent and spontaneous with normal comprehension.  Cognition:    The patient is oriented to person, place, and time;     recent and remote memory intact;     language fluent;     normal attention, concentration, fund of knowledge Cranial Nerves:    The pupils are equal, round, and reactive to light. Visual fields are full Extraocular movements are intact.  The face is symmetric with normal sensation. The palate elevates in the midline. Hearing intact. Voice is normal. Shoulder shrug is normal. The tongue has normal motion without fasciculations.   Coordination: normal  Gait:    No abnormalities noted or reported  Motor Observation:   no involuntary movements noted. Tone:    normal  Posture:    Posture is normal. normal erect    Strength:    Strength is anti-gravity and symmetric in the upper and lower limbs.      Sensation: intact to LT, no reports of numbness or tingling or  paresthesias         Assessment/Plan:   LOVELY 73 y.o. female here as requested by Windle Guard, MD for diffuse fasciculations. has Cerebral aneurysm and PFO (patent foramen ovale) on their problem list. She is going to [facility] Dr. Drucie Ip and he is an anesthesiologist and he has been providing injections. She states she has not fallen for several years. Her balance is off and she feels weaker. Moving is harder. Her legs are weak. She has had muscle twitches for years. And they are the same in both legs. The twitches can wake her in the night. Her calfs and legs can cramp or become stiff and it is painful she even has to vocalize. In 2020 we did not see fasciculations. She has been having low back pain for years and in [employer] in June she lifted something heavy. Worsened her symptoms since then with back pain; She states Dr. Drucie Ip believes she needs SI injections this month and this past July she had lumbar ESI which helped and helped the fasciculations. She was sent here for evaluation of fasciculations. She states she is confused on the diagnosis and needs a more comprehensive understanding. Extended visit.  Start Flexeril at 5mg  at bedtime (830-9pm) Take the Gabapentin earlier (6pm) or at bedtime see how you feel Plan going forward: Can increase flexeril to 10mg  and/or increase gabapentin. Also there are different muscle relaxers to try as well (Baclofen, Robaxin) Other medications to try include Cymbalta also used in nerve pain  Consider a pain clinic referral for pain medication management  Lidoacine patches on low back, , has tried lidocain topical in the past effective bit do not last long wpould benefit from patches, Lidoacine patches on low back 5% but if insurance wont approve can get  4% patches over the counter Dry needling ask therapist will send for strengthening as well as water therapy:  Physical Therapy: Lumbar myofascial pain, chronic low back pain. Please evaluate and treat  including dry needling, stretching, strengthening, manual therapy/massage, heating, TENS unit, exercising for scapular stabilization, pectoral stretching and rhomboid strengthening as clinically warranted as well as any other modality as recommended by evaluation.   Meds ordered this encounter  Medications   cyclobenzaprine (FLEXERIL) 5 MG tablet    Sig: Take 1 tablet (5 mg total) by mouth at bedtime.    Dispense:  30 tablet    Refill:  6   lidocaine (LIDODERM) 5 %    Sig: Place 2 patches onto the skin daily. Remove & Discard patch within 12 hours or as directed by MD    Dispense:  60 patch    Refill:  0   Orders Placed This Encounter  Procedures   Ambulatory referral to Physical Therapy    Last Visit:  Extended visit today with patient and daughter:Here with daughter. We review prior workup. We discussed she has chronic pain. She can have injections, then facet injections or other procedures, spinal cord stimulator. She is scheduled for water therapy at [facility]. Extended visit. We discussed her fasciculations, her emg/ncs did not show neuromuscular disorder, extensive blood testing was negative. Monitoring caffeine, treating anxiety, using medications like ca-channel blockers or aeds like gabapentin or propranolol. Discussed medications. Discussed channelopathies and other genetic disorders,pros and cons of genetic testing  Cramps: Magnesium doses and other OTC medications you can try: 200 mg mag citrate 1-2x daily 400 mg mag oxide 1-2x daily Mag sulfate 600 mg daily Magnesium glycinate 200-400mg  at bedtime Find a spot in 8 weeks  Tonic water with quinine, banana, magnesium as above  Should be lifting weights, scared to hurt her back. Personal trainer.   At last visit and at this visit with daughter repeated the following: - We reviewed images of dermatomes and myotomes in detail.  - We reviewed mri  images of the lumbar spine and cervical spine.  As well as EMG nerve  conduction study.  See HPI.   Orders Placed This Encounter  Procedures   Ambulatory referral to Physical Therapy   Meds ordered this encounter  Medications   cyclobenzaprine (FLEXERIL) 5 MG tablet    Sig: Take 1 tablet (5 mg total) by mouth at bedtime.    Dispense:  30 tablet    Refill:  6   lidocaine (LIDODERM) 5 %    Sig: Place 2 patches onto the skin daily. Remove & Discard patch within 12 hours or as directed by MD    Dispense:  60 patch    Refill:  0    Cc: Alysia Penna, MD,  Alysia Penna, MD  Naomie Dean, MD  [facility] [address] [place], [place] [address]  Phone [phone] Fax [phone]  I spent over 45 minutes of face-to-face and non-face-to-face time with patient on the  1. Myofascial pain syndrome of lumbar spine   2. Chronic bilateral low back pain with bilateral sciatica   3. Muscle cramps   4. 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 Benign fasciculations     diagnosis.  This included previsit chart review, lab review, study review, order entry, electronic health record documentation, patient education on the different diagnostic and therapeutic options, counseling and coordination of care, risks and benefits of management, compliance, or risk factor reduction

## 2023-05-25 NOTE — Patient Instructions (Addendum)
 Start Flexeril at 5mg  at bedtime (830-9pm) Take the Gabapentin earlier (6pm) or at bedtime see how you feel Plan going forward: Can increase flexeril to 10mg  and/or increase gabapentin. Also there are different muscle relaxers to try as well (Baclofen, Robaxin) Other medications to try include Cymbalta also used in nerve pain  Consider a pain clinic referral for pain medication management  Lidocaine patches on low back 5% but if insurance wont approve can get 4% patches over the counter Dry needling ask therapist will send for strengthening as well as water therapy  Cyclobenzaprine Tablets What is this medication? CYCLOBENZAPRINE (sye kloe BEN za preen) treats muscle spasms. It works by relaxing your muscles, which reduces muscle stiffness. It belongs to a group of medications called muscle relaxants. This medicine may be used for other purposes; ask your health care provider or pharmacist if you have questions. COMMON BRAND NAME(S): Fexmid, Flexeril What should I tell my care team before I take this medication? They need to know if you have any of these conditions: Heart disease, irregular heartbeat, or previous heart attack Liver disease Thyroid problem An unusual or allergic reaction to cyclobenzaprine, tricyclic antidepressants, lactose, other medications, foods, dyes, or preservatives Pregnant or trying to get pregnant Breastfeeding How should I use this medication? Take this medication by mouth with a glass of water. Take it as directed on the prescription label. You can take it with or without food. If it upsets your stomach, take it with food. Do not take it more often than directed. Talk to your care team about the use of this medication in children. Special care may be needed. Overdosage: If you think you have taken too much of this medicine contact a poison control center or emergency room at once. NOTE: This medicine is only for you. Do not share this medicine with others. What  if I miss a dose? If you miss a dose, take it as soon as you can. If it is almost time for your next dose, take only that dose. Do not take double or extra doses. What may interact with this medication? Do not take this medication with any of the following: MAOIs, such as Carbex, Eldepryl, Marplan, Nardil, and Parnate Opioid medications for cough Safinamide This medication may also interact with the following: Alcohol Bupropion Antihistamines for allergy, cough, and cold Certain medications for anxiety or sleep Certain medications for bladder problems, such as oxybutynin, tolterodine Certain medications for depression, such as amitriptyline, fluoxetine, sertraline Certain medications for Parkinson disease, such as benztropine, trihexyphenidyl Certain medications for seizures, such as phenobarbital, primidone Certain medications for stomach problems, such as dicyclomine, hyoscyamine Certain medications for travel sickness, such as scopolamine General anesthetics, such as halothane, isoflurane, methoxyflurane, propofol Ipratropium Local anesthetics, such as lidocaine, pramoxine, tetracaine Medications that relax muscles for surgery Opioid medications for pain Phenothiazines, such as chlorpromazine, mesoridazine, prochlorperazine, thioridazine Verapamil This list may not describe all possible interactions. Give your health care provider a list of all the medicines, herbs, non-prescription drugs, or dietary supplements you use. Also tell them if you smoke, drink alcohol, or use illegal drugs. Some items may interact with your medicine. What should I watch for while using this medication? Visit your care team for regular checks on your progress. Tell your care team if your symptoms do not start to get better or if they get worse. This medication may affect your coordination, reaction time, or judgment. Do not drive or operate machinery until you know how this medication affects you.  Sit up or  stand slowly to reduce the risk of dizzy or fainting spells. Drinking alcohol with this medication can increase the risk of these side effects. Taking this medication with other substances that cause drowsiness, such as alcohol, benzodiazepines, or other opioids can cause serious side effects. Give your care team a list of all medications you use. They will tell you how much medication to take. Do not take more medication than directed. Call emergency services if you have problems breathing or staying awake. Your mouth may get dry. Chewing sugarless gum or sucking hard candy and drinking plenty of water may help. Contact your care team if the problem does not go away or is severe. What side effects may I notice from receiving this medication? Side effects that you should report to your care team as soon as possible: Allergic reactions--skin rash, itching, hives, swelling of the face, lips, tongue, or throat CNS depression--slow or shallow breathing, shortness of breath, feeling faint, dizziness, confusion, trouble staying awake Heart rhythm changes--fast or irregular heartbeat, dizziness, feeling faint or lightheaded, chest pain, trouble breathing Side effects that usually do not require medical attention (report to your care team if they continue or are bothersome): Constipation Dizziness Drowsiness Dry mouth Fatigue Nausea This list may not describe all possible side effects. Call your doctor for medical advice about side effects. You may report side effects to FDA at 1-800-FDA-1088. Where should I keep my medication? Keep out of the reach of children and pets. Store at room temperature between 20 and 25 degrees C (68 and 77 degrees F). Keep container tightly closed. Get rid of any unused medication after the expiration date. To get rid of medications that are no longer needed or have expired: Take the medication to a medication take-back program. Check with your pharmacy or law enforcement to  find a location. If you cannot return the medication, check the label or package insert to see if the medication should be thrown out in the garbage or flushed down the toilet. If you are not sure, ask your care team. If it is safe to put it in the trash, empty the medication out of the container. Mix the medication with cat litter, dirt, coffee grounds, or other unwanted substance. Seal the mixture in a bag or container. Put it in the trash. NOTE: This sheet is a summary. It may not cover all possible information. If you have questions about this medicine, talk to your doctor, pharmacist, or health care provider.  2024 Elsevier/Gold Standard (2021-08-22 00:00:00)

## 2023-05-25 NOTE — Telephone Encounter (Signed)
 Called pt and let her know that I had spoken with [facility] and they stated that they called 02/25/23 and 03/01/2023 to schedule her MRI. Told pt that they are requesting for her to call them so they can schedule her. Gave pt their number [phone], told pt to press 1 on prompt to get to the schedule MRI prompt and press 3 to get to the receptionist to get scheduled.

## 2023-05-27 ENCOUNTER — Encounter (HOSPITAL_BASED_OUTPATIENT_CLINIC_OR_DEPARTMENT_OTHER): Payer: Self-pay | Admitting: Physical Therapy

## 2023-05-27 ENCOUNTER — Ambulatory Visit (HOSPITAL_BASED_OUTPATIENT_CLINIC_OR_DEPARTMENT_OTHER): Payer: Medicare Other | Admitting: Physical Therapy

## 2023-05-27 DIAGNOSIS — R2689 Other abnormalities of gait and mobility: Secondary | ICD-10-CM

## 2023-05-27 DIAGNOSIS — M5459 Other low back pain: Secondary | ICD-10-CM | POA: Diagnosis not present

## 2023-05-27 DIAGNOSIS — M6281 Muscle weakness (generalized): Secondary | ICD-10-CM

## 2023-05-27 NOTE — Therapy (Signed)
 OUTPATIENT PHYSICAL THERAPY THORACOLUMBAR TREATMENT   Patient Name: Julie Tapia MRN: [id number] DOB:1950/11/24, 73 y.o., female Today's Date: 05/27/2023  END OF SESSION:  PT End of Session - 05/27/23 1145     Visit Number 8    Date for PT Re-Evaluation 06/04/23    Authorization Type mcr    Progress Note Due on Visit 10    PT Start Time 1105    PT Stop Time 1145    PT Time Calculation (min) 40 min    Activity Tolerance Patient tolerated treatment well    Behavior During Therapy [facility] for tasks assessed/performed               Past Medical History:  Diagnosis Date   Aneurysm (HCC) 01-16-11   "lt .side of head not intra(cavity area)/Found during series of tests to r/o TIA's"-report from [facility] showed  Lt. ICA  aneurysm   Arthritis 01-16-11   osteoarthritis,-knees, hips, ankles. 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 tight chested-"no inhalers in yrs.   Complication of anesthesia 01-16-11   Pt. very slow to awaken and was days before feeling like herself.   Past Surgical History:  Procedure Laterality Date   ABDOMINAL HYSTERECTOMY     BREAST CYST EXCISION  01-16-11   several excisions in the past   JOINT REPLACEMENT  01-16-11   Rt. great toe ball joint replacement-metal   REDUCTION MAMMAPLASTY  01-16-11   Bil. '86( problems with s/p surgical clot at rt. breast drainage site)   TOTAL HIP ARTHROPLASTY  01/20/2011   Procedure: TOTAL HIP ARTHROPLASTY ANTERIOR APPROACH;  Surgeon: Shelda Pal;  Location: [facility];  Service: Orthopedics;  Laterality: Right;   Patient Active Problem List   Diagnosis Date Noted   Myofascial pain syndrome of lumbar spine 05/25/2023   Chronic bilateral low back pain with bilateral sciatica 05/25/2023   Muscle cramps 05/25/2023   Nerve pain due to spinal stenosis 05/25/2023   Benign fasciculations 05/25/2023   Cerebral aneurysm 01/12/2021   PFO (patent foramen ovale)  01/12/2021    PCP: Alysia Penna MD  REFERRING PROVIDER:   Windle Guard, MD    REFERRING DIAG: M54.16 (ICD-10-CM) - Radiculopathy, lumbar region   Rationale for Evaluation and Treatment: Rehabilitation  THERAPY DIAG:  Muscle weakness (generalized)  Other abnormalities of gait and mobility  Other low back pain  ONSET DATE: last year  SUBJECTIVE:  SUBJECTIVE STATEMENT: "Think the shot may have helped.  Pain better sleeping better"  POOL ACCESS: none currently in winter; given list of pools today.    Initial Subjective Began/Back injection 2007-8.  Went dormant.  Returned 2020.  Summer 2024 hurt my back lifting case of water.  Have had back pain continual since. Drucie Ip has given injections in lumbar spine and L SIJ. Fasciculation/cramps were bad in my calves.  Better with gabapentin low dose.  Most nights I can sleep (2-3 x week not sleeping). I have atrophied a lot.  Have a treadmill that I walk on it 5/7 days a week 6000-10000 step.  I immediately lay on mat doing exercises I was shown form other PT in past after finishing on bike. May need to have my left hip replaced pain and stiffness there.  I feel stiff in my back and hip. Weakness has been significantly decreasing since the summer. Tested for ALS neg  PERTINENT HISTORY:  R THR  PAIN:  Are you having pain? Yes: NPRS scale: current 2/10 Pain location: LB; bilat lower lateral legs Pain description: cramping; ache; burning Aggravating factors: walking on treadmill Relieving factors: rest/ stretching, meds  PRECAUTIONS: None  RED FLAGS: None   WEIGHT BEARING RESTRICTIONS: No  FALLS:  Has patient fallen in last 6 months? No  LIVING ENVIRONMENT: Lives with: lives alone Lives in: House/apartment Stairs: Yes: External: 8  steps; on right going up Has following equipment at home: None  OCCUPATION: retired last year;  PLOF: Independent  PATIENT GOALS: Increase strength, improve overall health, balance to do more  NEXT MD VISIT: end of March  OBJECTIVE:  Note: Objective measures were completed at Evaluation unless otherwise noted.  DIAGNOSTIC FINDINGS:  MRI lumbar spine dated October 08, 2018 shows type II lumbosacral transitional vertebrae. At L2-3 there is moderate left and mild to moderate right neural foraminal narrowing with contact of the exiting bilateral L2 nerve roots worse on the left than the right. There is moderate left subarticular zone narrowing with mild spinal canal stenosis impinging upon with mild to moderate displacement of the left L3 nerve root due to a very small central disc extrusion superimposed upon a severe bulging disc osteophyte complex asymmetric to the left. At L3-4 there is a disc extrusion superimposed upon a severe left lateral bulging disc osteophyte and severe left disc height loss resulting in moderate to severe left foraminal narrowing and impingement upon the exiting left L3 nerve root. Moderate left and mild right subarticular zone narrowing and mild spinal canal stenosis with impingement upon the descending left L4 nerve root. At L4-5 there is moderate to severe left and moderate right facet arthrosis. Mild dextroconvex lumbar scoliosis with apex at L3.   PATIENT SURVEYS:  ODI  14(2)=28/45= 62%  COGNITION: Overall cognitive status: Within functional limits for tasks assessed     SENSATION: dysesthesias posterior calf pain and pain in the sole of her foot  MUSCLE LENGTH: Hamstrings: tight   POSTURE: rounded shoulders and increased thoracic kyphosis   LUMBAR ROM:   wfl  LOWER EXTREMITY ROM:     wfl  LOWER EXTREMITY MMT:    MMT Right eval Left eval  Hip flexion 39.5 29.8  Hip extension    Hip abduction 13.9 18.0  Hip adduction    Hip internal rotation     Hip external rotation    Knee flexion    Knee extension 26.4 22.6  Ankle dorsiflexion    Ankle plantarflexion    Ankle  inversion    Ankle eversion     (Blank rows = not tested)  LUMBAR SPECIAL TESTS:  Straight leg raise test: Negative and Slump test: Negative  FUNCTIONAL TESTS:  5 times sit to stand: 13.80 Timed up and go (TUG): 10.79 4 stage: passed 1&2.  Tandem x 5s; SLS x 7 05/18/23:  5x STS:  10.5s  GAIT: Distance walked: unlimited distance Assistive device utilized: None Level of assistance: Complete Independence Comments: wfl  TREATMENT                                                                                                                          [facility] Adult PT Treatment:                                                DATE: 05/24/23   Pt seen for aquatic therapy today.  Treatment took place in water 3.5-4.75 ft in depth at the [facility] pool. Temp of water was 91.  Pt entered/exited the pool via stairs with hand rail.   *walking forward and back unsupported, 3 laps *Unsupported: side stepping with arm addct/ abdct -> with light resistance bells  * wide stance then staggered with reciprocal arm swing with light resistance bells 5 slow then 5 fast arm swing 3 sets ea *Noodle press full hollow noodle wide stance then staggered 10 reps ea *Tandem stance ue support Rbhb holding x 10s  - ue add/abd x 10.  (Good challenge.)   *Hip hinging tactile and VC for demonstration.  Good execution.  Stretch mainly hamstrings * return to walking backward/ forward with arm swing *  straddling noodle UE on wall : cycling, hip abdct/ addct, cycling    PATIENT EDUCATION:  Education details: aquatic therapy progression / modifications Person educated: Patient Education method: Explanation Education comprehension: verbalized understanding  HOME EXERCISE PROGRAM: Pt follows indep  ASSESSMENT:  CLINICAL IMPRESSION: Improved pain, pt reports sleeping well last  2 night. Able to tolerate increased core and glut engagement.  Progressed balance challenges. Of Note left glute weaker than right. Goals ongoing   Initial impression Patient is a 73 y.o. f who was seen today for physical therapy evaluation and treatment for Radiculopathy, lumbar region. She reports dysesthesias posterior calf pain and pain in the sole of her foot with associated significant calf cramping which has subsided since taking gabapentin.  Steroid injections in lumbar spine and r SIJ has significantly decreased LBP but she continues to have episodes of radicular discomfort in right anteromedial thigh to knee she reports as tightness and tingling.  She is an active senior but has become afraid of falling (has not had any falls) due to an increase in general weakness and reports of general muscle atrophy. She does follow her own exercise program walking on treadmill x 40 mins + 5 days  a week but does not do other things she enjoys doing outside her home due to fear of falling. Objective and functional testing demonstrate LE and core weakness.   She will benefit from skilled PT intervention to improve all areas of deficits and return pt to improved QOL. Will begin in aquatics then transition land based when approp.    OBJECTIVE IMPAIRMENTS: decreased activity tolerance, decreased balance, decreased endurance, decreased mobility, difficulty walking, and decreased strength.   ACTIVITY LIMITATIONS: carrying, lifting, bending, standing, squatting, stairs, transfers, and locomotion level  PARTICIPATION LIMITATIONS: cleaning, community activity, occupation, and yard work  PERSONAL FACTORS: Time since onset of injury/illness/exacerbation are also affecting patient's functional outcome.   REHAB POTENTIAL: Good  CLINICAL DECISION MAKING: Evolving/moderate complexity  EVALUATION COMPLEXITY: Moderate   GOALS: Goals reviewed with patient? Yes  SHORT TERM GOALS: Target date: 05/10/23  Pt will  tolerate full aquatic sessions consistently without increase in pain and with improving function to demonstrate good toleration and effectiveness of intervention.  Baseline: Goal status: MET - 05/18/23  2. Pt will perform SLS and tandem stance in 3.6 ft unsupported x 20s Baseline: see chart Goal INITIAL  3.Pt will report 50% decrease in fear of falling. Baseline: "afraid to come outside my box" Goal status: IN PROGRESS - 05/18/23  4.  Pt will improve on 5 X STS test to <or= 10s to demonstrate improving functional lower extremity strength, transitional movements, and balance Baseline: 13.80 at eval;  see above Goal status: Met- 05/18/23   LONG TERM GOALS: Target date: 06/04/23  Pt will improve on ODI by 25% to demonstrate improved function ability "to do more". Baseline: 62% Goal status: INITIAL  2.  Pt will report running vacuum cleaner (Complete house work) without limitation to fatigue or pain Baseline:  Goal status: INITIAL  3.  Pt to be able to take trash can to and from curb safely Baseline:  Goal status: INITIAL  4.  Pt will report decreased waking from cramps at night to 1x week or less Baseline: 3 Goal status: INITIAL  5.  Pt will improve strength in all areas listed by at least 10 lbs  to demonstrate improved overall physical function Baseline: see chart Goal status: INITIAL  6.   Pt will report ability to complete a "full workout" (completing cardio/ lifting)  Baseline:  Goal status: INITIAL  PLAN:  PT FREQUENCY: 2x/week  PT DURATION: 8 weeks  PLANNED INTERVENTIONS: 97164- PT Re-evaluation, 97110-Therapeutic exercises, 97530- Therapeutic activity, 97112- Neuromuscular re-education, 97535- Self Care, 16109- Manual therapy, [id number]- Gait training, [phone]- Orthotic Fit/training, [phone]- Aquatic Therapy, 97014- Electrical stimulation (unattended), [phone]- Ionotophoresis 4mg /ml Dexamethasone, Patient/Family education, Balance training, Stair training, Taping, Dry Needling,  Joint mobilization, DME instructions, Cryotherapy, and Moist heat.  PLAN FOR NEXT SESSION: aquatics: general strengthening; balance retraining HEP  Rushie Chestnut) Dionne Knoop MPT 05/27/23 11:45 AM [facility] [address] [place], [place], [address] Phone: [phone]   Fax:  [phone]

## 2023-05-31 ENCOUNTER — Ambulatory Visit (HOSPITAL_BASED_OUTPATIENT_CLINIC_OR_DEPARTMENT_OTHER): Payer: Self-pay | Admitting: Physical Therapy

## 2023-05-31 ENCOUNTER — Encounter (HOSPITAL_BASED_OUTPATIENT_CLINIC_OR_DEPARTMENT_OTHER): Payer: Self-pay | Admitting: Physical Therapy

## 2023-05-31 DIAGNOSIS — M6281 Muscle weakness (generalized): Secondary | ICD-10-CM

## 2023-05-31 DIAGNOSIS — R2689 Other abnormalities of gait and mobility: Secondary | ICD-10-CM | POA: Diagnosis not present

## 2023-05-31 DIAGNOSIS — M5459 Other low back pain: Secondary | ICD-10-CM

## 2023-05-31 NOTE — Therapy (Signed)
OUTPATIENT PHYSICAL THERAPY THORACOLUMBAR TREATMENT   Patient Name: Julie Tapia MRN: [id number] DOB:Apr 18, 1950, 73 y.o., female Today's Date: 05/31/2023  END OF SESSION:  PT End of Session - 05/31/23 1152     Visit Number 9    Date for PT Re-Evaluation 06/04/23    Authorization Type mcr    Progress Note Due on Visit 10    PT Start Time 1145    PT Stop Time 1223    PT Time Calculation (min) 38 min    Behavior During Therapy [facility] for tasks assessed/performed               Past Medical History:  Diagnosis Date   Aneurysm (HCC) 01-16-11   "lt .side of head not intra(cavity area)/Found during series of tests to r/o TIA's"-report from [facility] showed  Lt. ICA  aneurysm   Arthritis 01-16-11   osteoarthritis,-knees, hips, ankles. 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 tight chested-"no inhalers in yrs.   Complication of anesthesia 01-16-11   Pt. very slow to awaken and was days before feeling like herself.   Past Surgical History:  Procedure Laterality Date   ABDOMINAL HYSTERECTOMY     BREAST CYST EXCISION  01-16-11   several excisions in the past   JOINT REPLACEMENT  01-16-11   Rt. great toe ball joint replacement-metal   REDUCTION MAMMAPLASTY  01-16-11   Bil. '86( problems with s/p surgical clot at rt. breast drainage site)   TOTAL HIP ARTHROPLASTY  01/20/2011   Procedure: TOTAL HIP ARTHROPLASTY ANTERIOR APPROACH;  Surgeon: Shelda Pal;  Location: [facility];  Service: Orthopedics;  Laterality: Right;   Patient Active Problem List   Diagnosis Date Noted   Myofascial pain syndrome of lumbar spine 05/25/2023   Chronic bilateral low back pain with bilateral sciatica 05/25/2023   Muscle cramps 05/25/2023   Nerve pain due to spinal stenosis 05/25/2023   Benign fasciculations 05/25/2023   Cerebral aneurysm 01/12/2021   PFO (patent foramen ovale) 01/12/2021    PCP: Alysia Penna MD  REFERRING  PROVIDER:   Windle Guard, MD    REFERRING DIAG: M54.16 (ICD-10-CM) - Radiculopathy, lumbar region   Rationale for Evaluation and Treatment: Rehabilitation  THERAPY DIAG:  Muscle weakness (generalized)  Other abnormalities of gait and mobility  Other low back pain  ONSET DATE: last year  SUBJECTIVE:  SUBJECTIVE STATEMENT: Pt reports she has been on flexeril for 1 wk.   Having less cramps in LEs in the night.  Pt reports that she has an order for DN but she is unsure that she would like to pursue this.    POOL ACCESS: 95% certain she will join [employer].    Initial Subjective Began/Back injection 2007-8.  Went dormant.  Returned 2020.  Summer 2024 hurt my back lifting case of water.  Have had back pain continual since. Drucie Ip has given injections in lumbar spine and L SIJ. Fasciculation/cramps were bad in my calves.  Better with gabapentin low dose.  Most nights I can sleep (2-3 x week not sleeping). I have atrophied a lot.  Have a treadmill that I walk on it 5/7 days a week 6000-10000 step.  I immediately lay on mat doing exercises I was shown form other PT in past after finishing on bike. May need to have my left hip replaced pain and stiffness there.  I feel stiff in my back and hip. Weakness has been significantly decreasing since the summer. Tested for ALS neg  PERTINENT HISTORY:  R THR  PAIN:  Are you having pain? no: NPRS scale: current 0/10 Pain location:  Pain description:  Aggravating factors: walking on treadmill Relieving factors: rest/ stretching, meds  PRECAUTIONS: None  RED FLAGS: None   WEIGHT BEARING RESTRICTIONS: No  FALLS:  Has patient fallen in last 6 months? No  LIVING ENVIRONMENT: Lives with: lives alone Lives in: House/apartment Stairs: Yes: External: 8  steps; on right going up Has following equipment at home: None  OCCUPATION: retired last year;  PLOF: Independent  PATIENT GOALS: Increase strength, improve overall health, balance to do more  NEXT MD VISIT: end of March  OBJECTIVE:  Note: Objective measures were completed at Evaluation unless otherwise noted.  DIAGNOSTIC FINDINGS:  MRI lumbar spine dated October 08, 2018 shows type II lumbosacral transitional vertebrae. At L2-3 there is moderate left and mild to moderate right neural foraminal narrowing with contact of the exiting bilateral L2 nerve roots worse on the left than the right. There is moderate left subarticular zone narrowing with mild spinal canal stenosis impinging upon with mild to moderate displacement of the left L3 nerve root due to a very small central disc extrusion superimposed upon a severe bulging disc osteophyte complex asymmetric to the left. At L3-4 there is a disc extrusion superimposed upon a severe left lateral bulging disc osteophyte and severe left disc height loss resulting in moderate to severe left foraminal narrowing and impingement upon the exiting left L3 nerve root. Moderate left and mild right subarticular zone narrowing and mild spinal canal stenosis with impingement upon the descending left L4 nerve root. At L4-5 there is moderate to severe left and moderate right facet arthrosis. Mild dextroconvex lumbar scoliosis with apex at L3.   PATIENT SURVEYS:  ODI  14(2)=28/45= 62%  COGNITION: Overall cognitive status: Within functional limits for tasks assessed     SENSATION: dysesthesias posterior calf pain and pain in the sole of her foot  MUSCLE LENGTH: Hamstrings: tight   POSTURE: rounded shoulders and increased thoracic kyphosis   LUMBAR ROM:   wfl  LOWER EXTREMITY ROM:     wfl  LOWER EXTREMITY MMT:    MMT Right eval Left eval  Hip flexion 39.5 29.8  Hip extension    Hip abduction 13.9 18.0  Hip adduction    Hip internal rotation     Hip external rotation  Knee flexion    Knee extension 26.4 22.6  Ankle dorsiflexion    Ankle plantarflexion    Ankle inversion    Ankle eversion     (Blank rows = not tested)  LUMBAR SPECIAL TESTS:  Straight leg raise test: Negative and Slump test: Negative  FUNCTIONAL TESTS:  5 times sit to stand: 13.80 Timed up and go (TUG): 10.79 4 stage: passed 1&2.  Tandem x 5s; SLS x 7 05/18/23:  5x STS:  10.5s 05/31/23:  SLS in 14ft 6":  25+ sec,  tandem stance in 29ft6":  25+ sec    GAIT: Distance walked: unlimited distance Assistive device utilized: None Level of assistance: Complete Independence Comments: wfl  TREATMENT                                                                                                                          [facility] Adult PT Treatment:                                                DATE: 05/31/23  Pt seen for aquatic therapy today.  Treatment took place in water 3.5-4.75 ft in depth at the [facility] pool. Temp of water was 91.  Pt entered/exited the pool via stairs with hand rail.  *walking forward and back unsupported *side stepping with arm addct/ abdct with rainbow hand floats * farmer carry with single rainbow hand float at side walking forward / backward *Tandem gait forward/ backward UE support Rainbow hand floats (in 63ft) * SLS and tandem stance in 60ft6" - see above for measurements *  straddling noodle UE without UE support : cycling * return to walking backward/ forward with arm swing * TrA set with hollow noodle pull down to thighs in wide stance x 5 and staggered stance x 5 each LE forward * UE on hollow noodle: hip abdct/ addct x 10; leg swings into hip flex/ ext x 10  * UE pushing hollow noodle down at side walking forward/ backward then trial of SLS with noodle at side     PATIENT EDUCATION:  Education details: aquatic therapy progression / modifications Person educated: Patient Education method: Explanation Education  comprehension: verbalized understanding  HOME EXERCISE PROGRAM: TBA  ASSESSMENT:  CLINICAL IMPRESSION: Pt reported some LE tightness following balance exercises.  She was able to complete SLS and tandem stance in 74ft6" water without difficulty; has met STG2.  No increase in pain during session.  PT to complete 10th visit progress note next visit. Recommend pt begin alternating land and water sessions after next visit and will plan to create aquatic HEP for her.    Initial impression Patient is a 73 y.o. f who was seen today for physical therapy evaluation and treatment for Radiculopathy, lumbar region. She reports dysesthesias posterior calf pain and pain in the sole of  her foot with associated significant calf cramping which has subsided since taking gabapentin.  Steroid injections in lumbar spine and r SIJ has significantly decreased LBP but she continues to have episodes of radicular discomfort in right anteromedial thigh to knee she reports as tightness and tingling.  She is an active senior but has become afraid of falling (has not had any falls) due to an increase in general weakness and reports of general muscle atrophy. She does follow her own exercise program walking on treadmill x 40 mins + 5 days a week but does not do other things she enjoys doing outside her home due to fear of falling. Objective and functional testing demonstrate LE and core weakness.   She will benefit from skilled PT intervention to improve all areas of deficits and return pt to improved QOL. Will begin in aquatics then transition land based when approp.    OBJECTIVE IMPAIRMENTS: decreased activity tolerance, decreased balance, decreased endurance, decreased mobility, difficulty walking, and decreased strength.   ACTIVITY LIMITATIONS: carrying, lifting, bending, standing, squatting, stairs, transfers, and locomotion level  PARTICIPATION LIMITATIONS: cleaning, community activity, occupation, and yard  work  PERSONAL FACTORS: Time since onset of injury/illness/exacerbation are also affecting patient's functional outcome.   REHAB POTENTIAL: Good  CLINICAL DECISION MAKING: Evolving/moderate complexity  EVALUATION COMPLEXITY: Moderate   GOALS: Goals reviewed with patient? Yes  SHORT TERM GOALS: Target date: 05/10/23  Pt will tolerate full aquatic sessions consistently without increase in pain and with improving function to demonstrate good toleration and effectiveness of intervention.  Baseline: Goal status: MET - 05/18/23  2. Pt will perform SLS and tandem stance in 3.6 ft unsupported x 20s Baseline: see chart Goal MET- 05/31/23  3.Pt will report 50% decrease in fear of falling. Baseline: "afraid to come outside my box" Goal status: IN PROGRESS - 05/18/23  4.  Pt will improve on 5 X STS test to <or= 10s to demonstrate improving functional lower extremity strength, transitional movements, and balance Baseline: 13.80 at eval;  see above Goal status: Met- 05/18/23   LONG TERM GOALS: Target date: 06/04/23  Pt will improve on ODI by 25% to demonstrate improved function ability "to do more". Baseline: 62% Goal status: INITIAL  2.  Pt will report running vacuum cleaner (Complete house work) without limitation to fatigue or pain Baseline:  Goal status: INITIAL  3.  Pt to be able to take trash can to and from curb safely Baseline:  Goal status: INITIAL  4.  Pt will report decreased waking from cramps at night to 1x week or less Baseline: 3 Goal status: INITIAL  5.  Pt will improve strength in all areas listed by at least 10 lbs  to demonstrate improved overall physical function Baseline: see chart Goal status: INITIAL  6.   Pt will report ability to complete a "full workout" (completing cardio/ lifting)  Baseline:  Goal status: INITIAL  PLAN:  PT FREQUENCY: 2x/week  PT DURATION: 8 weeks  PLANNED INTERVENTIONS: 97164- PT Re-evaluation, 97110-Therapeutic exercises,  97530- Therapeutic activity, 97112- Neuromuscular re-education, 97535- Self Care, 13086- Manual therapy, [phone]- Gait training, [phone]- Orthotic Fit/training, [phone]- Aquatic Therapy, 97014- Electrical stimulation (unattended), [phone]- Ionotophoresis 4mg /ml Dexamethasone, Patient/Family education, Balance training, Stair training, Taping, Dry Needling, Joint mobilization, DME instructions, Cryotherapy, and Moist heat.  PLAN FOR NEXT SESSION: aquatics: general strengthening; balance retraining HEP  Mayer Camel, PTA 05/31/23 12:37 PM [facility] [address] [place], [place], [address] Phone: [phone]   Fax:  [phone]  

## 2023-06-01 ENCOUNTER — Telehealth: Payer: Self-pay

## 2023-06-01 ENCOUNTER — Other Ambulatory Visit (HOSPITAL_COMMUNITY): Payer: Self-pay

## 2023-06-01 NOTE — Telephone Encounter (Signed)
 I called Julie Tapia, she stated that she spoke to Dr. Lucia Gaskins when in for appt with her daughter and the lidocaine patch was ordered, do not think it would be a big issue.  So would proceed with PA.  Also she has not received a call back from [facility] about the MRI's scheduled. Is is w/wo??

## 2023-06-01 NOTE — Telephone Encounter (Signed)
 Received a request via CMM for Lidocaine 5% patches-however per chart review PT has Lidocaine listed on her allergies. Do you wish to proceed with this PA for Lidocaine? Please advise-

## 2023-06-02 ENCOUNTER — Other Ambulatory Visit (HOSPITAL_COMMUNITY): Payer: Self-pay

## 2023-06-02 ENCOUNTER — Ambulatory Visit (HOSPITAL_BASED_OUTPATIENT_CLINIC_OR_DEPARTMENT_OTHER): Attending: Anesthesiology | Admitting: Physical Therapy

## 2023-06-02 ENCOUNTER — Encounter (HOSPITAL_BASED_OUTPATIENT_CLINIC_OR_DEPARTMENT_OTHER): Payer: Self-pay | Admitting: Physical Therapy

## 2023-06-02 DIAGNOSIS — G8929 Other chronic pain: Secondary | ICD-10-CM | POA: Insufficient documentation

## 2023-06-02 DIAGNOSIS — R2689 Other abnormalities of gait and mobility: Secondary | ICD-10-CM | POA: Diagnosis not present

## 2023-06-02 DIAGNOSIS — M5442 Lumbago with sciatica, left side: Secondary | ICD-10-CM | POA: Insufficient documentation

## 2023-06-02 DIAGNOSIS — M5459 Other low back pain: Secondary | ICD-10-CM

## 2023-06-02 DIAGNOSIS — M6281 Muscle weakness (generalized): Secondary | ICD-10-CM

## 2023-06-02 DIAGNOSIS — R252 Cramp and spasm: Secondary | ICD-10-CM | POA: Insufficient documentation

## 2023-06-02 DIAGNOSIS — M7918 Myalgia, other site: Secondary | ICD-10-CM | POA: Diagnosis not present

## 2023-06-02 DIAGNOSIS — M4800 Spinal stenosis, site unspecified: Secondary | ICD-10-CM | POA: Insufficient documentation

## 2023-06-02 DIAGNOSIS — M5441 Lumbago with sciatica, right side: Secondary | ICD-10-CM | POA: Insufficient documentation

## 2023-06-02 NOTE — Telephone Encounter (Signed)
 Pharmacy Patient Advocate Encounter   Received notification from CoverMyMeds that prior authorization for Lidocaine 5% Patches is required/requested.   Insurance verification completed.   The patient is insured through [employer] .   Per test claim: PA required; PA submitted to above mentioned insurance via CoverMyMeds Key/confirmation #/EOC [id number] Status is pending

## 2023-06-02 NOTE — Telephone Encounter (Signed)
 I spoke with her about the lidocaine, she did not have an allergic reaction so she is willing to try the patches, just discontinue if she has itching or skin reaction thanks

## 2023-06-02 NOTE — Therapy (Signed)
 OUTPATIENT PHYSICAL THERAPY THORACOLUMBAR TREATMENT  Progress Note/re-cert Reporting Period 04/09/23 to 06/02/23  See note below for Objective Data and Assessment of Progress/Goals.     Patient Name: Julie Tapia MRN: [id number] DOB:January 19, 1951, 73 y.o., female Today's Date: 06/02/2023  END OF SESSION:  PT End of Session - 06/02/23 0929     Visit Number 10    Date for PT Re-Evaluation 07/30/23    Authorization Type mcr    Progress Note Due on Visit 20    PT Start Time 0930    PT Stop Time 1010    PT Time Calculation (min) 40 min    Activity Tolerance Patient tolerated treatment well    Behavior During Therapy [facility] for tasks assessed/performed                Past Medical History:  Diagnosis Date   Aneurysm (HCC) 01-16-11   "lt .side of head not intra(cavity area)/Found during series of tests to r/o TIA's"-report from [facility] showed  Lt. ICA  aneurysm   Arthritis 01-16-11   osteoarthritis,-knees, hips, ankles. 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 tight chested-"no inhalers in yrs.   Complication of anesthesia 01-16-11   Pt. very slow to awaken and was days before feeling like herself.   Past Surgical History:  Procedure Laterality Date   ABDOMINAL HYSTERECTOMY     BREAST CYST EXCISION  01-16-11   several excisions in the past   JOINT REPLACEMENT  01-16-11   Rt. great toe ball joint replacement-metal   REDUCTION MAMMAPLASTY  01-16-11   Bil. '86( problems with s/p surgical clot at rt. breast drainage site)   TOTAL HIP ARTHROPLASTY  01/20/2011   Procedure: TOTAL HIP ARTHROPLASTY ANTERIOR APPROACH;  Surgeon: Shelda Pal;  Location: [facility];  Service: Orthopedics;  Laterality: Right;   Patient Active Problem List   Diagnosis Date Noted   Myofascial pain syndrome of lumbar spine 05/25/2023   Chronic bilateral low back pain with bilateral sciatica 05/25/2023   Muscle cramps 05/25/2023   Nerve  pain due to spinal stenosis 05/25/2023   Benign fasciculations 05/25/2023   Cerebral aneurysm 01/12/2021   PFO (patent foramen ovale) 01/12/2021    PCP: Alysia Penna MD  REFERRING PROVIDER:   Windle Guard, MD    REFERRING DIAG: M54.16 (ICD-10-CM) - Radiculopathy, lumbar region   Rationale for Evaluation and Treatment: Rehabilitation  THERAPY DIAG:  Muscle weakness (generalized)  Other abnormalities of gait and mobility  Other low back pain  ONSET DATE: last year  SUBJECTIVE:  SUBJECTIVE STATEMENT: Pt states that she will not push as hard when strength testing because last time it caused a lot of .    POOL ACCESS: 95% certain she will join [employer].    Initial Subjective Began/Back injection 2007-8.  Went dormant.  Returned 2020.  Summer 2024 hurt my back lifting case of water.  Have had back pain continual since. Drucie Ip has given injections in lumbar spine and L SIJ. Fasciculation/cramps were bad in my calves.  Better with gabapentin low dose.  Most nights I can sleep (2-3 x week not sleeping). I have atrophied a lot.  Have a treadmill that I walk on it 5/7 days a week 6000-10000 step.  I immediately lay on mat doing exercises I was shown form other PT in past after finishing on bike. May need to have my left hip replaced pain and stiffness there.  I feel stiff in my back and hip. Weakness has been significantly decreasing since the summer. Tested for ALS neg  PERTINENT HISTORY:  R THR  PAIN:  Are you having pain? no: NPRS scale: current 0/10 Pain location:  Pain description:  Aggravating factors: walking on treadmill Relieving factors: rest/ stretching, meds  PRECAUTIONS: None  RED FLAGS: None   WEIGHT BEARING RESTRICTIONS: No  FALLS:  Has patient fallen in last 6 months?  No  LIVING ENVIRONMENT: Lives with: lives alone Lives in: House/apartment Stairs: Yes: External: 8 steps; on right going up Has following equipment at home: None  OCCUPATION: retired last year;  PLOF: Independent  PATIENT GOALS: Increase strength, improve overall health, balance to do more  NEXT MD VISIT: end of March  OBJECTIVE:  Note: Objective measures were completed at Evaluation unless otherwise noted.  DIAGNOSTIC FINDINGS:  MRI lumbar spine dated October 08, 2018 shows type II lumbosacral transitional vertebrae. At L2-3 there is moderate left and mild to moderate right neural foraminal narrowing with contact of the exiting bilateral L2 nerve roots worse on the left than the right. There is moderate left subarticular zone narrowing with mild spinal canal stenosis impinging upon with mild to moderate displacement of the left L3 nerve root due to a very small central disc extrusion superimposed upon a severe bulging disc osteophyte complex asymmetric to the left. At L3-4 there is a disc extrusion superimposed upon a severe left lateral bulging disc osteophyte and severe left disc height loss resulting in moderate to severe left foraminal narrowing and impingement upon the exiting left L3 nerve root. Moderate left and mild right subarticular zone narrowing and mild spinal canal stenosis with impingement upon the descending left L4 nerve root. At L4-5 there is moderate to severe left and moderate right facet arthrosis. Mild dextroconvex lumbar scoliosis with apex at L3.   PATIENT SURVEYS:  ODI  14(2)=28/45= 62% 06/02/23: 14/45= 32%  COGNITION: Overall cognitive status: Within functional limits for tasks assessed     SENSATION: dysesthesias posterior calf pain and pain in the sole of her foot  MUSCLE LENGTH: Hamstrings: tight   POSTURE: rounded shoulders and increased thoracic kyphosis   LUMBAR ROM:   wfl  LOWER EXTREMITY ROM:     wfl  LOWER EXTREMITY MMT:    MMT  Right eval Left eval R / L 06/02/23  Hip flexion 39.5 29.8 26.3 / 24.9  Hip extension     Hip abduction 13.9 18.0 29.2 / 21.5  Hip adduction     Hip internal rotation     Hip external rotation     Knee  flexion     Knee extension 26.4 22.6 31.9 / 22.2  Ankle dorsiflexion     Ankle plantarflexion     Ankle inversion     Ankle eversion      (Blank rows = not tested)  LUMBAR SPECIAL TESTS:  Straight leg raise test: Negative and Slump test: Negative  FUNCTIONAL TESTS:  5 times sit to stand: 13.80 Timed up and go (TUG): 10.79 4 stage: passed 1&2.  Tandem x 5s; SLS x 7 05/18/23:  5x STS:  10.5s 05/31/23:  SLS in 24ft 6":  25+ sec,  tandem stance in 58ft6":  25+ sec    GAIT: Distance walked: unlimited distance Assistive device utilized: None Level of assistance: Complete Independence Comments: wfl  TREATMENT                                                                                                                          [facility] Adult PT Treatment:                                                DATE: 05/31/23  Pt seen for aquatic therapy today.  Treatment took place in water 3.5-4.75 ft in depth at the [facility] pool. Temp of water was 91.  Pt entered/exited the pool via stairs with hand rail.  *walking forward and back unsupported *side stepping with arm addct/ abdct with rainbow hand floats * farmer carry with single rainbow hand float at side walking forward / backward *Tandem gait forward/ backward UE support Rainbow hand floats (in 24ft) * SLS and tandem stance in 64ft6" - see above for measurements *  straddling noodle UE without UE support : cycling * return to walking backward/ forward with arm swing * TrA set with hollow noodle pull down to thighs in wide stance x 5 and staggered stance x 5 each LE forward * UE on hollow noodle: hip abdct/ addct x 10; leg swings into hip flex/ ext x 10  * UE pushing hollow noodle down at side walking forward/ backward then  trial of SLS with noodle at side     PATIENT EDUCATION:  Education details: aquatic therapy progression / modifications Person educated: Patient Education method: Explanation Education comprehension: verbalized understanding  HOME EXERCISE PROGRAM: TBA  ASSESSMENT:  CLINICAL IMPRESSION: PN:Pt reports 45% decrease in fear of falling. She is able to sweep and Swiffer but has to complete it with rest periods.  She has not yet been able to get garbage can to street as she is fearfull of falling.  She has progressed very well with aquatic intervention.  Her pain has significantly reduced, overall her sleep patterns have improved and ODI suggests a 50% improvement in her perceived ability. She has met 3/5 STG and has made progress with all LTGs.  She will continue to benefit from skilled  PT intervention with slight change in Plan to begin adding land based intervention to further progress pt towards meeting all land based goals.  She is concerned about being able to lift full grocery bags and completing house work without taking many rest periods.  Plan to alternated aquatics/land initially then transition fully to land based as tolerated.  She is planning on joining a gym to gain access to pool soon. Note: a new order was received by pt neurologist that has been scanned for the same dx and treatment as already being completed.   Initial impression Patient is a 73 y.o. f who was seen today for physical therapy evaluation and treatment for Radiculopathy, lumbar region. She reports dysesthesias posterior calf pain and pain in the sole of her foot with associated significant calf cramping which has subsided since taking gabapentin.  Steroid injections in lumbar spine and r SIJ has significantly decreased LBP but she continues to have episodes of radicular discomfort in right anteromedial thigh to knee she reports as tightness and tingling.  She is an active senior but has become afraid of falling (has  not had any falls) due to an increase in general weakness and reports of general muscle atrophy. She does follow her own exercise program walking on treadmill x 40 mins + 5 days a week but does not do other things she enjoys doing outside her home due to fear of falling. Objective and functional testing demonstrate LE and core weakness.   She will benefit from skilled PT intervention to improve all areas of deficits and return pt to improved QOL. Will begin in aquatics then transition land based when approp.    OBJECTIVE IMPAIRMENTS: decreased activity tolerance, decreased balance, decreased endurance, decreased mobility, difficulty walking, and decreased strength.   ACTIVITY LIMITATIONS: carrying, lifting, bending, standing, squatting, stairs, transfers, and locomotion level  PARTICIPATION LIMITATIONS: cleaning, community activity, occupation, and yard work  PERSONAL FACTORS: Time since onset of injury/illness/exacerbation are also affecting patient's functional outcome.   REHAB POTENTIAL: Good  CLINICAL DECISION MAKING: Evolving/moderate complexity  EVALUATION COMPLEXITY: Moderate   GOALS: Goals reviewed with patient? Yes  SHORT TERM GOALS: Target date: 05/10/23  Pt will tolerate full aquatic sessions consistently without increase in pain and with improving function to demonstrate good toleration and effectiveness of intervention.  Baseline: Goal status: MET - 05/18/23  2. Pt will perform SLS and tandem stance in 3.6 ft unsupported x 20s Baseline: see chart Goal MET- 05/31/23  3.Pt will report 50% decrease in fear of falling. Baseline: "afraid to come outside my box";  45% Goal status: IN PROGRESS - 05/18/23; 06/02/23  4.  Pt will improve on 5 X STS test to <or= 10s to demonstrate improving functional lower extremity strength, transitional movements, and balance Baseline: 13.80 at eval;  see above Goal status: Met- 05/18/23   LONG TERM GOALS: Target date: 06/04/23  Pt will improve  on ODI by 25% to demonstrate improved function ability "to do more". Baseline: 62%; 32% Goal status: In progress 06/02/23  2.  Pt will report running vacuum cleaner (Complete house work) without limitation to fatigue or pain Baseline:  Goal status: In progress 06/02/23  3.  Pt to be able to take trash can to and from curb safely Baseline: unable Goal status: In Progress 06/02/23  4.  Pt will report decreased waking from cramps at night to 1x week or less Baseline: 3 Goal status: In progress 06/02/23  5.  Pt will improve strength in all areas  listed by at least 10 lbs  to demonstrate improved overall physical function Baseline: see chart Goal status: In progress 06/02/23  6.   Pt will report ability to complete a "full workout" (completing cardio/ lifting)  Baseline:  Goal status: in progress 06/02/23  PLAN:  PT FREQUENCY: 2x/week  PT DURATION: 8 weeks  PLANNED INTERVENTIONS: 97164- PT Re-evaluation, 97110-Therapeutic exercises, 97530- Therapeutic activity, 97112- Neuromuscular re-education, 97535- Self Care, 63875- Manual therapy, [phone]- Gait training, [phone]- Orthotic Fit/training, [phone]- Aquatic Therapy, 97014- Electrical stimulation (unattended), [phone]- Ionotophoresis 4mg /ml Dexamethasone, Patient/Family education, Balance training, Stair training, Taping, Dry Needling, Joint mobilization, DME instructions, Cryotherapy, and Moist heat.  PLAN FOR NEXT SESSION: aquatics: general strengthening; balance retraining HEP Land: progress general strengthening; lifting, balance, stair training;aerobic capacity  Rushie Chestnut) Latera Mclin MPT 06/02/23 10:35 AM [facility] [address] [place], [place], [address] Phone: [phone]   Fax:  [phone]

## 2023-06-03 ENCOUNTER — Other Ambulatory Visit (HOSPITAL_COMMUNITY): Payer: Self-pay

## 2023-06-03 NOTE — Telephone Encounter (Signed)
 Pharmacy Patient Advocate Encounter  Received notification from [facility] that Prior Authorization for Lidocaine 5% patches has been APPROVED from 05/19/2023 to 03/01/2098. Ran test claim, Copay is $155.38. This test claim was processed through [facility]- copay amounts may vary at other pharmacies due to pharmacy/plan contracts, or as the patient moves through the different stages of their insurance plan.   PA #/Case ID/Reference #: PA Case ID #: [id number]

## 2023-06-04 ENCOUNTER — Ambulatory Visit (HOSPITAL_BASED_OUTPATIENT_CLINIC_OR_DEPARTMENT_OTHER): Admitting: Physical Therapy

## 2023-06-07 ENCOUNTER — Ambulatory Visit (HOSPITAL_BASED_OUTPATIENT_CLINIC_OR_DEPARTMENT_OTHER): Admitting: Physical Therapy

## 2023-06-07 DIAGNOSIS — M5459 Other low back pain: Secondary | ICD-10-CM

## 2023-06-07 DIAGNOSIS — R2689 Other abnormalities of gait and mobility: Secondary | ICD-10-CM

## 2023-06-07 DIAGNOSIS — M7918 Myalgia, other site: Secondary | ICD-10-CM | POA: Diagnosis not present

## 2023-06-07 DIAGNOSIS — M6281 Muscle weakness (generalized): Secondary | ICD-10-CM

## 2023-06-07 DIAGNOSIS — M5441 Lumbago with sciatica, right side: Secondary | ICD-10-CM | POA: Diagnosis not present

## 2023-06-07 DIAGNOSIS — M5442 Lumbago with sciatica, left side: Secondary | ICD-10-CM | POA: Diagnosis not present

## 2023-06-07 NOTE — Therapy (Unsigned)
 OUTPATIENT PHYSICAL THERAPY THORACOLUMBAR TREATMENT  Progress Note/re-cert Reporting Period 04/09/23 to 06/02/23  See note below for Objective Data and Assessment of Progress/Goals.     Patient Name: Julie Tapia MRN: [id number] DOB:25-Dec-1950, 73 y.o., female Today's Date: 06/08/2023  END OF SESSION:  PT End of Session - 06/07/23 1534     PT Start Time 1534                 Past Medical History:  Diagnosis Date   Aneurysm (HCC) 01-16-11   "lt .side of head not intra(cavity area)/Found during series of tests to r/o TIA's"-report from [facility] showed  Lt. ICA  aneurysm   Arthritis 01-16-11   osteoarthritis,-knees, hips, ankles. 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 tight chested-"no inhalers in yrs.   Complication of anesthesia 01-16-11   Pt. very slow to awaken and was days before feeling like herself.   Past Surgical History:  Procedure Laterality Date   ABDOMINAL HYSTERECTOMY     BREAST CYST EXCISION  01-16-11   several excisions in the past   JOINT REPLACEMENT  01-16-11   Rt. great toe ball joint replacement-metal   REDUCTION MAMMAPLASTY  01-16-11   Bil. '86( problems with s/p surgical clot at rt. breast drainage site)   TOTAL HIP ARTHROPLASTY  01/20/2011   Procedure: TOTAL HIP ARTHROPLASTY ANTERIOR APPROACH;  Surgeon: Shelda Pal;  Location: [facility];  Service: Orthopedics;  Laterality: Right;   Patient Active Problem List   Diagnosis Date Noted   Myofascial pain syndrome of lumbar spine 05/25/2023   Chronic bilateral low back pain with bilateral sciatica 05/25/2023   Muscle cramps 05/25/2023   Nerve pain due to spinal stenosis 05/25/2023   Benign fasciculations 05/25/2023   Cerebral aneurysm 01/12/2021   PFO (patent foramen ovale) 01/12/2021    PCP: Alysia Penna MD  REFERRING PROVIDER:   Windle Guard, MD    REFERRING DIAG: M54.16 (ICD-10-CM) - Radiculopathy, lumbar  region   Rationale for Evaluation and Treatment: Rehabilitation  THERAPY DIAG:  No diagnosis found.  ONSET DATE: last year  SUBJECTIVE:                                                                                                                                                                                           SUBJECTIVE STATEMENT: The patient comes in today for her first land appointment. She reports overall she has done well with the water. She is still having some pain.   POOL ACCESS: 95% certain she will join [facility].  Initial Subjective Began/Back injection 2007-8.  Went dormant.  Returned 2020.  Summer 2024 hurt my back lifting case of water.  Have had back pain continual since. Drucie Ip has given injections in lumbar spine and L SIJ. Fasciculation/cramps were bad in my calves.  Better with gabapentin low dose.  Most nights I can sleep (2-3 x week not sleeping). I have atrophied a lot.  Have a treadmill that I walk on it 5/7 days a week 6000-10000 step.  I immediately lay on mat doing exercises I was shown form other PT in past after finishing on bike. May need to have my left hip replaced pain and stiffness there.  I feel stiff in my back and hip. Weakness has been significantly decreasing since the summer. Tested for ALS neg  PERTINENT HISTORY:  R THR  PAIN:  Are you having pain? no: NPRS scale: current 0/10 Pain location:  Pain description:  Aggravating factors: walking on treadmill Relieving factors: rest/ stretching, meds  PRECAUTIONS: None  RED FLAGS: None   WEIGHT BEARING RESTRICTIONS: No  FALLS:  Has patient fallen in last 6 months? No  LIVING ENVIRONMENT: Lives with: lives alone Lives in: House/apartment Stairs: Yes: External: 8 steps; on right going up Has following equipment at home: None  OCCUPATION: retired last year;  PLOF: Independent  PATIENT GOALS: Increase strength, improve overall health, balance to do more  NEXT MD VISIT: end of  March  OBJECTIVE:  Note: Objective measures were completed at Evaluation unless otherwise noted.  DIAGNOSTIC FINDINGS:  MRI lumbar spine dated October 08, 2018 shows type II lumbosacral transitional vertebrae. At L2-3 there is moderate left and mild to moderate right neural foraminal narrowing with contact of the exiting bilateral L2 nerve roots worse on the left than the right. There is moderate left subarticular zone narrowing with mild spinal canal stenosis impinging upon with mild to moderate displacement of the left L3 nerve root due to a very small central disc extrusion superimposed upon a severe bulging disc osteophyte complex asymmetric to the left. At L3-4 there is a disc extrusion superimposed upon a severe left lateral bulging disc osteophyte and severe left disc height loss resulting in moderate to severe left foraminal narrowing and impingement upon the exiting left L3 nerve root. Moderate left and mild right subarticular zone narrowing and mild spinal canal stenosis with impingement upon the descending left L4 nerve root. At L4-5 there is moderate to severe left and moderate right facet arthrosis. Mild dextroconvex lumbar scoliosis with apex at L3.   PATIENT SURVEYS:  ODI  14(2)=28/45= 62% 06/02/23: 14/45= 32%  COGNITION: Overall cognitive status: Within functional limits for tasks assessed     SENSATION: dysesthesias posterior calf pain and pain in the sole of her foot  MUSCLE LENGTH: Hamstrings: tight   POSTURE: rounded shoulders and increased thoracic kyphosis   LUMBAR ROM:   wfl  LOWER EXTREMITY ROM:     wfl  LOWER EXTREMITY MMT:    MMT Right eval Left eval R / L 06/02/23  Hip flexion 39.5 29.8 26.3 / 24.9  Hip extension     Hip abduction 13.9 18.0 29.2 / 21.5  Hip adduction     Hip internal rotation     Hip external rotation     Knee flexion     Knee extension 26.4 22.6 31.9 / 22.2  Ankle dorsiflexion     Ankle plantarflexion     Ankle inversion      Ankle eversion      (  Blank rows = not tested)  LUMBAR SPECIAL TESTS:  Straight leg raise test: Negative and Slump test: Negative  FUNCTIONAL TESTS:  5 times sit to stand: 13.80 Timed up and go (TUG): 10.79 4 stage: passed 1&2.  Tandem x 5s; SLS x 7 05/18/23:  5x STS:  10.5s 05/31/23:  SLS in 67ft 6":  25+ sec,  tandem stance in 48ft6":  25+ sec    GAIT: Distance walked: unlimited distance Assistive device utilized: None Level of assistance: Complete Independence Comments: wfl  TREATMENT                                                                                                                           4/8:  Manual: Skilled palpation of trigger points. Review of self soft tissue mobilization using thera-cane and tennis ball. Trigger point release to gluteal and lumbar spine. Discussed trigger point dry needling.   There:ex  LTR x20  Gluteal stretch 3x20 sec hold   Neuro-re-ed   Cuing for posture and breathing with exercises  [facility] Adult PT Treatment:                                                DATE: 05/31/23  Pt seen for aquatic therapy today.  Treatment took place in water 3.5-4.75 ft in depth at the [facility] pool. Temp of water was 91.  Pt entered/exited the pool via stairs with hand rail.  *walking forward and back unsupported *side stepping with arm addct/ abdct with rainbow hand floats * farmer carry with single rainbow hand float at side walking forward / backward *Tandem gait forward/ backward UE support Rainbow hand floats (in 75ft) * SLS and tandem stance in 47ft6" - see above for measurements *  straddling noodle UE without UE support : cycling * return to walking backward/ forward with arm swing * TrA set with hollow noodle pull down to thighs in wide stance x 5 and staggered stance x 5 each LE forward * UE on hollow noodle: hip abdct/ addct x 10; leg swings into hip flex/ ext x 10  * UE pushing hollow noodle down at side walking forward/  backward then trial of SLS with noodle at side     PATIENT EDUCATION:  Education details: aquatic therapy progression / modifications Person educated: Patient Education method: Explanation Education comprehension: verbalized understanding  HOME EXERCISE PROGRAM: TBA  ASSESSMENT:  CLINICAL IMPRESSION: The patient tolerated treatment well. We reviewed base self soft tissue mobilization and stretching. She was advised to see which ones help her pain more. She was aslo given 2 postural and core strengthening exercises. We gave her cuing for breathing and posture. We will continue to expand her land program.   Initial impression Patient is a 73 y.o. f who was seen today for physical therapy evaluation and treatment for Radiculopathy,  lumbar region. She reports dysesthesias posterior calf pain and pain in the sole of her foot with associated significant calf cramping which has subsided since taking gabapentin.  Steroid injections in lumbar spine and r SIJ has significantly decreased LBP but she continues to have episodes of radicular discomfort in right anteromedial thigh to knee she reports as tightness and tingling.  She is an active senior but has become afraid of falling (has not had any falls) due to an increase in general weakness and reports of general muscle atrophy. She does follow her own exercise program walking on treadmill x 40 mins + 5 days a week but does not do other things she enjoys doing outside her home due to fear of falling. Objective and functional testing demonstrate LE and core weakness.   She will benefit from skilled PT intervention to improve all areas of deficits and return pt to improved QOL. Will begin in aquatics then transition land based when approp.    OBJECTIVE IMPAIRMENTS: decreased activity tolerance, decreased balance, decreased endurance, decreased mobility, difficulty walking, and decreased strength.   ACTIVITY LIMITATIONS: carrying, lifting, bending,  standing, squatting, stairs, transfers, and locomotion level  PARTICIPATION LIMITATIONS: cleaning, community activity, occupation, and yard work  PERSONAL FACTORS: Time since onset of injury/illness/exacerbation are also affecting patient's functional outcome.   REHAB POTENTIAL: Good  CLINICAL DECISION MAKING: Evolving/moderate complexity  EVALUATION COMPLEXITY: Moderate   GOALS: Goals reviewed with patient? Yes  SHORT TERM GOALS: Target date: 05/10/23  Pt will tolerate full aquatic sessions consistently without increase in pain and with improving function to demonstrate good toleration and effectiveness of intervention.  Baseline: Goal status: MET - 05/18/23  2. Pt will perform SLS and tandem stance in 3.6 ft unsupported x 20s Baseline: see chart Goal MET- 05/31/23  3.Pt will report 50% decrease in fear of falling. Baseline: "afraid to come outside my box";  45% Goal status: IN PROGRESS - 05/18/23; 06/02/23  4.  Pt will improve on 5 X STS test to <or= 10s to demonstrate improving functional lower extremity strength, transitional movements, and balance Baseline: 13.80 at eval;  see above Goal status: Met- 05/18/23   LONG TERM GOALS: Target date: 06/04/23  Pt will improve on ODI by 25% to demonstrate improved function ability "to do more". Baseline: 62%; 32% Goal status: In progress 06/02/23  2.  Pt will report running vacuum cleaner (Complete house work) without limitation to fatigue or pain Baseline:  Goal status: In progress 06/02/23  3.  Pt to be able to take trash can to and from curb safely Baseline: unable Goal status: In Progress 06/02/23  4.  Pt will report decreased waking from cramps at night to 1x week or less Baseline: 3 Goal status: In progress 06/02/23  5.  Pt will improve strength in all areas listed by at least 10 lbs  to demonstrate improved overall physical function Baseline: see chart Goal status: In progress 06/02/23  6.   Pt will report ability to complete a  "full workout" (completing cardio/ lifting)  Baseline:  Goal status: in progress 06/02/23  PLAN:  PT FREQUENCY: 2x/week  PT DURATION: 8 weeks  PLANNED INTERVENTIONS: 97164- PT Re-evaluation, 97110-Therapeutic exercises, 97530- Therapeutic activity, 97112- Neuromuscular re-education, 97535- Self Care, 60454- Manual therapy, [phone]- Gait training, [phone]- Orthotic Fit/training, [phone]- Aquatic Therapy, 97014- Electrical stimulation (unattended), [phone]- Ionotophoresis 4mg /ml Dexamethasone, Patient/Family education, Balance training, Stair training, Taping, Dry Needling, Joint mobilization, DME instructions, Cryotherapy, and Moist heat.  PLAN FOR NEXT SESSION: aquatics: general  strengthening; balance retraining HEP Land: progress general strengthening; lifting, balance, stair training;aerobic capacity  Rushie Chestnut) Ziemba MPT 06/08/23 8:11 AM [facility] [address] [place], [place], [address] Phone: [phone]   Fax:  [phone]

## 2023-06-08 ENCOUNTER — Encounter (HOSPITAL_BASED_OUTPATIENT_CLINIC_OR_DEPARTMENT_OTHER): Payer: Self-pay | Admitting: Physical Therapy

## 2023-06-10 ENCOUNTER — Encounter (HOSPITAL_BASED_OUTPATIENT_CLINIC_OR_DEPARTMENT_OTHER): Payer: Self-pay | Admitting: Physical Therapy

## 2023-06-10 ENCOUNTER — Ambulatory Visit (HOSPITAL_BASED_OUTPATIENT_CLINIC_OR_DEPARTMENT_OTHER): Payer: Self-pay | Admitting: Physical Therapy

## 2023-06-10 DIAGNOSIS — M6281 Muscle weakness (generalized): Secondary | ICD-10-CM | POA: Diagnosis not present

## 2023-06-10 DIAGNOSIS — M5441 Lumbago with sciatica, right side: Secondary | ICD-10-CM | POA: Diagnosis not present

## 2023-06-10 DIAGNOSIS — R2689 Other abnormalities of gait and mobility: Secondary | ICD-10-CM | POA: Diagnosis not present

## 2023-06-10 DIAGNOSIS — M5459 Other low back pain: Secondary | ICD-10-CM

## 2023-06-10 DIAGNOSIS — M5442 Lumbago with sciatica, left side: Secondary | ICD-10-CM | POA: Diagnosis not present

## 2023-06-10 DIAGNOSIS — M7918 Myalgia, other site: Secondary | ICD-10-CM | POA: Diagnosis not present

## 2023-06-10 NOTE — Therapy (Signed)
 OUTPATIENT PHYSICAL THERAPY THORACOLUMBAR TREATMENT   Patient Name: Julie Tapia MRN: [id number] DOB:01-19-1951, 73 y.o., female Today's Date: 06/10/2023  END OF SESSION:  PT End of Session - 06/10/23 1535     Visit Number 12    Date for PT Re-Evaluation 07/30/23    Authorization Type mcr    Progress Note Due on Visit 20    PT Start Time 1530    PT Stop Time 1608    PT Time Calculation (min) 38 min    Activity Tolerance Patient tolerated treatment well    Behavior During Therapy [facility] for tasks assessed/performed               Past Medical History:  Diagnosis Date   Aneurysm (HCC) 01-16-11   "lt .side of head not intra(cavity area)/Found during series of tests to r/o TIA's"-report from [facility] showed  Lt. ICA  aneurysm   Arthritis 01-16-11   osteoarthritis,-knees, hips, ankles. 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 tight chested-"no inhalers in yrs.   Complication of anesthesia 01-16-11   Pt. very slow to awaken and was days before feeling like herself.   Past Surgical History:  Procedure Laterality Date   ABDOMINAL HYSTERECTOMY     BREAST CYST EXCISION  01-16-11   several excisions in the past   JOINT REPLACEMENT  01-16-11   Rt. great toe ball joint replacement-metal   REDUCTION MAMMAPLASTY  01-16-11   Bil. '86( problems with s/p surgical clot at rt. breast drainage site)   TOTAL HIP ARTHROPLASTY  01/20/2011   Procedure: TOTAL HIP ARTHROPLASTY ANTERIOR APPROACH;  Surgeon: Shelda Pal;  Location: [facility];  Service: Orthopedics;  Laterality: Right;   Patient Active Problem List   Diagnosis Date Noted   Myofascial pain syndrome of lumbar spine 05/25/2023   Chronic bilateral low back pain with bilateral sciatica 05/25/2023   Muscle cramps 05/25/2023   Nerve pain due to spinal stenosis 05/25/2023   Benign fasciculations 05/25/2023   Cerebral aneurysm 01/12/2021   PFO (patent foramen ovale)  01/12/2021    PCP: Alysia Penna MD  REFERRING PROVIDER:   Windle Guard, MD    REFERRING DIAG: M54.16 (ICD-10-CM) - Radiculopathy, lumbar region   Rationale for Evaluation and Treatment: Rehabilitation  THERAPY DIAG:  Muscle weakness (generalized)  Other abnormalities of gait and mobility  Other low back pain  ONSET DATE: last year  SUBJECTIVE:  SUBJECTIVE STATEMENT: "I'm ok today".    Pt reports she tolerated land session well.   POOL ACCESS: none currently in winter; given list of pools today.    Initial Subjective Began/Back injection 2007-8.  Went dormant.  Returned 2020.  Summer 2024 hurt my back lifting case of water.  Have had back pain continual since. Drucie Ip has given injections in lumbar spine and L SIJ. Fasciculation/cramps were bad in my calves.  Better with gabapentin low dose.  Most nights I can sleep (2-3 x week not sleeping). I have atrophied a lot.  Have a treadmill that I walk on it 5/7 days a week 6000-10000 step.  I immediately lay on mat doing exercises I was shown form other PT in past after finishing on bike. May need to have my left hip replaced pain and stiffness there.  I feel stiff in my back and hip. Weakness has been significantly decreasing since the summer. Tested for ALS neg  PERTINENT HISTORY:  R THR  PAIN:  Are you having pain? no: NPRS scale: current 0/10 Pain location:  Pain description:  Aggravating factors: walking on treadmill Relieving factors: rest/ stretching, meds  PRECAUTIONS: None  RED FLAGS: None   WEIGHT BEARING RESTRICTIONS: No  FALLS:  Has patient fallen in last 6 months? No  LIVING ENVIRONMENT: Lives with: lives alone Lives in: House/apartment Stairs: Yes: External: 8 steps; on right going up Has following equipment at  home: None  OCCUPATION: retired last year;  PLOF: Independent  PATIENT GOALS: Increase strength, improve overall health, balance to do more  NEXT MD VISIT: end of March  OBJECTIVE:  Note: Objective measures were completed at Evaluation unless otherwise noted.  DIAGNOSTIC FINDINGS:  MRI lumbar spine dated October 08, 2018 shows type II lumbosacral transitional vertebrae. At L2-3 there is moderate left and mild to moderate right neural foraminal narrowing with contact of the exiting bilateral L2 nerve roots worse on the left than the right. There is moderate left subarticular zone narrowing with mild spinal canal stenosis impinging upon with mild to moderate displacement of the left L3 nerve root due to a very small central disc extrusion superimposed upon a severe bulging disc osteophyte complex asymmetric to the left. At L3-4 there is a disc extrusion superimposed upon a severe left lateral bulging disc osteophyte and severe left disc height loss resulting in moderate to severe left foraminal narrowing and impingement upon the exiting left L3 nerve root. Moderate left and mild right subarticular zone narrowing and mild spinal canal stenosis with impingement upon the descending left L4 nerve root. At L4-5 there is moderate to severe left and moderate right facet arthrosis. Mild dextroconvex lumbar scoliosis with apex at L3.   PATIENT SURVEYS:  ODI  14(2)=28/45= 62%  COGNITION: Overall cognitive status: Within functional limits for tasks assessed     SENSATION: dysesthesias posterior calf pain and pain in the sole of her foot  MUSCLE LENGTH: Hamstrings: tight   POSTURE: rounded shoulders and increased thoracic kyphosis   LUMBAR ROM:   wfl  LOWER EXTREMITY ROM:     wfl  LOWER EXTREMITY MMT:    MMT Right eval Left eval  Hip flexion 39.5 29.8  Hip extension    Hip abduction 13.9 18.0  Hip adduction    Hip internal rotation    Hip external rotation    Knee flexion    Knee  extension 26.4 22.6  Ankle dorsiflexion    Ankle plantarflexion    Ankle inversion  Ankle eversion     (Blank rows = not tested)  LUMBAR SPECIAL TESTS:  Straight leg raise test: Negative and Slump test: Negative  FUNCTIONAL TESTS:  5 times sit to stand: 13.80 Timed up and go (TUG): 10.79 4 stage: passed 1&2.  Tandem x 5s; SLS x 7 05/18/23:  5x STS:  10.5s  GAIT: Distance walked: unlimited distance Assistive device utilized: None Level of assistance: Complete Independence Comments: wfl  TREATMENT                                                                                                                          [facility] Adult PT Treatment:                                                DATE: 06/10/23 Pt seen for aquatic therapy today.  Treatment took place in water 3.5-4.75 ft in depth at the [facility] pool. Temp of water was 91.  Pt entered/exited the pool via stairs with hand rail.   *walking forward and back unsupported, 3 laps *side stepping with arm addct/ abdct with light resistance bells  * wide stance then staggered with reciprocal arm swing with light resistance bells 20 slow, 10 fast; bilat horiz abdct/ addct 10 slow/ 10 fast x 3 *TrA with Noodle press full hollow noodle to thighs in wide stance -> then at 11 and 1 o'clock -> repeated with kick board *Tandem gait with UE on support rainbow hand floats (too easy) -> tandem stance with hands out of water, horiz head turns * knee taps (same side/ opp side) with forward backward marching *  hip hinge with forward arm reach x 10 * single leg superman x 5 each side (challenge)  *  straddling noodle UE on wall : cycling, hip abdct/ addct, cycling    PATIENT EDUCATION:  Education details: aquatic therapy progression / modifications Person educated: Patient Education method: Explanation Education comprehension: verbalized understanding  HOME EXERCISE PROGRAM: AQUATIC Access Code: [email or web address] URL:  [email or web address] Date: 06/10/2023 Prepared by: [facility] [address] This aquatic home exercise program from Med[employer] utilizes pictures from land based exercises, but has been adapted prior to lamination and issuance.  * not issued yet.   ASSESSMENT:  CLINICAL IMPRESSION: Pt tolerated all exercises well, with some soreness in lower back.  Aquatic HEP created, but not issued yet.  Will begin instructing on it and modifying it in future sessions.  Pt is progressing well towards remaining goals.   Therapist to assess LTGs as time allows.     Initial impression Patient is a 73 y.o. f who was seen today for physical therapy evaluation and treatment for Radiculopathy, lumbar region. She reports dysesthesias posterior calf pain and pain in the sole of her foot with associated significant calf cramping which has subsided since taking  gabapentin.  Steroid injections in lumbar spine and r SIJ has significantly decreased LBP but she continues to have episodes of radicular discomfort in right anteromedial thigh to knee she reports as tightness and tingling.  She is an active senior but has become afraid of falling (has not had any falls) due to an increase in general weakness and reports of general muscle atrophy. She does follow her own exercise program walking on treadmill x 40 mins + 5 days a week but does not do other things she enjoys doing outside her home due to fear of falling. Objective and functional testing demonstrate LE and core weakness.   She will benefit from skilled PT intervention to improve all areas of deficits and return pt to improved QOL. Will begin in aquatics then transition land based when approp.    OBJECTIVE IMPAIRMENTS: decreased activity tolerance, decreased balance, decreased endurance, decreased mobility, difficulty walking, and decreased strength.   ACTIVITY LIMITATIONS: carrying, lifting, bending, standing, squatting, stairs,  transfers, and locomotion level  PARTICIPATION LIMITATIONS: cleaning, community activity, occupation, and yard work  PERSONAL FACTORS: Time since onset of injury/illness/exacerbation are also affecting patient's functional outcome.   REHAB POTENTIAL: Good  CLINICAL DECISION MAKING: Evolving/moderate complexity  EVALUATION COMPLEXITY: Moderate   GOALS: Goals reviewed with patient? Yes  SHORT TERM GOALS: Target date: 05/10/23  Pt will tolerate full aquatic sessions consistently without increase in pain and with improving function to demonstrate good toleration and effectiveness of intervention.  Baseline: Goal status: MET - 05/18/23  2. Pt will perform SLS and tandem stance in 3.6 ft unsupported x 20s Baseline: see chart Goal INITIAL  3.Pt will report 50% decrease in fear of falling. Baseline: "afraid to come outside my box" Goal status: IN PROGRESS - 05/18/23  4.  Pt will improve on 5 X STS test to <or= 10s to demonstrate improving functional lower extremity strength, transitional movements, and balance Baseline: 13.80 at eval;  see above Goal status: Met- 05/18/23   LONG TERM GOALS: Target date: POC  Pt will improve on ODI by 25% to demonstrate improved function ability "to do more". Baseline: 62% Goal status: INITIAL  2.  Pt will report running vacuum cleaner (Complete house work) without limitation to fatigue or pain Baseline:  Goal status: INITIAL  3.  Pt to be able to take trash can to and from curb safely Baseline:  Goal status: INITIAL  4.  Pt will report decreased waking from cramps at night to 1x week or less Baseline: 3 Goal status: INITIAL  5.  Pt will improve strength in all areas listed by at least 10 lbs  to demonstrate improved overall physical function Baseline: see chart Goal status: INITIAL  6.   Pt will report ability to complete a "full workout" (completing cardio/ lifting)  Baseline:  Goal status: INITIAL  PLAN:  PT FREQUENCY:  2x/week  PT DURATION: 8 weeks  PLANNED INTERVENTIONS: 97164- PT Re-evaluation, 97110-Therapeutic exercises, 97530- Therapeutic activity, 97112- Neuromuscular re-education, 97535- Self Care, 45409- Manual therapy, [id number]- Gait training, [phone]- Orthotic Fit/training, [phone]- Aquatic Therapy, 97014- Electrical stimulation (unattended), [phone]- Ionotophoresis 4mg /ml Dexamethasone, Patient/Family education, Balance training, Stair training, Taping, Dry Needling, Joint mobilization, DME instructions, Cryotherapy, and Moist heat.  PLAN FOR NEXT SESSION: aquatics: general strengthening; balance retraining HEP  Mayer Camel, PTA 06/10/23 4:10 PM [facility] [address] [place], [place], [address] Phone: [phone]   Fax:  [phone]

## 2023-06-15 ENCOUNTER — Encounter (HOSPITAL_BASED_OUTPATIENT_CLINIC_OR_DEPARTMENT_OTHER): Payer: Self-pay | Admitting: Physical Therapy

## 2023-06-15 ENCOUNTER — Ambulatory Visit (HOSPITAL_BASED_OUTPATIENT_CLINIC_OR_DEPARTMENT_OTHER): Payer: Self-pay | Admitting: Physical Therapy

## 2023-06-15 DIAGNOSIS — M5442 Lumbago with sciatica, left side: Secondary | ICD-10-CM | POA: Diagnosis not present

## 2023-06-15 DIAGNOSIS — M5459 Other low back pain: Secondary | ICD-10-CM | POA: Diagnosis not present

## 2023-06-15 DIAGNOSIS — M7918 Myalgia, other site: Secondary | ICD-10-CM | POA: Diagnosis not present

## 2023-06-15 DIAGNOSIS — R2689 Other abnormalities of gait and mobility: Secondary | ICD-10-CM

## 2023-06-15 DIAGNOSIS — M5441 Lumbago with sciatica, right side: Secondary | ICD-10-CM | POA: Diagnosis not present

## 2023-06-15 DIAGNOSIS — M6281 Muscle weakness (generalized): Secondary | ICD-10-CM

## 2023-06-15 NOTE — Therapy (Signed)
 OUTPATIENT PHYSICAL THERAPY THORACOLUMBAR TREATMENT   Patient Name: Julie Tapia MRN: [id number] DOB:1951/02/11, 73 y.o., female Today's Date: 06/15/2023  END OF SESSION:  PT End of Session - 06/15/23 1537     Visit Number 13    Date for PT Re-Evaluation 07/30/23    Authorization Type mcr    Progress Note Due on Visit 20    PT Start Time 1531    PT Stop Time 1610    PT Time Calculation (min) 39 min    Activity Tolerance Patient tolerated treatment well    Behavior During Therapy [facility] for tasks assessed/performed               Past Medical History:  Diagnosis Date   Aneurysm (HCC) 01-16-11   "lt .side of head not intra(cavity area)/Found during series of tests to r/o TIA's"-report from [facility] showed  Lt. ICA  aneurysm   Arthritis 01-16-11   osteoarthritis,-knees, hips, ankles. 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 tight chested-"no inhalers in yrs.   Complication of anesthesia 01-16-11   Pt. very slow to awaken and was days before feeling like herself.   Past Surgical History:  Procedure Laterality Date   ABDOMINAL HYSTERECTOMY     BREAST CYST EXCISION  01-16-11   several excisions in the past   JOINT REPLACEMENT  01-16-11   Rt. great toe ball joint replacement-metal   REDUCTION MAMMAPLASTY  01-16-11   Bil. '86( problems with s/p surgical clot at rt. breast drainage site)   TOTAL HIP ARTHROPLASTY  01/20/2011   Procedure: TOTAL HIP ARTHROPLASTY ANTERIOR APPROACH;  Surgeon: Bevin Bucks;  Location: [facility];  Service: Orthopedics;  Laterality: Right;   Patient Active Problem List   Diagnosis Date Noted   Myofascial pain syndrome of lumbar spine 05/25/2023   Chronic bilateral low back pain with bilateral sciatica 05/25/2023   Muscle cramps 05/25/2023   Nerve pain due to spinal stenosis 05/25/2023   Benign fasciculations 05/25/2023   Cerebral aneurysm 01/12/2021   PFO (patent foramen ovale)  01/12/2021    PCP: Barnetta Liberty MD  REFERRING PROVIDER:   Michele Ahle, MD    REFERRING DIAG: M54.16 (ICD-10-CM) - Radiculopathy, lumbar region   Rationale for Evaluation and Treatment: Rehabilitation  THERAPY DIAG:  Muscle weakness (generalized)  Other abnormalities of gait and mobility  Other low back pain  ONSET DATE: last year  SUBJECTIVE:  SUBJECTIVE STATEMENT: "I can tell I am stronger these exercises are easier."  Was abl to walk 44010 steps other day without residual pain POOL ACCESS: none currently in winter; given list of pools today.    Initial Subjective Began/Back injection 2007-8.  Went dormant.  Returned 2020.  Summer 2024 hurt my back lifting case of water.  Have had back pain continual since. Phares Brasher has given injections in lumbar spine and L SIJ. Fasciculation/cramps were bad in my calves.  Better with gabapentin low dose.  Most nights I can sleep (2-3 x week not sleeping). I have atrophied a lot.  Have a treadmill that I walk on it 5/7 days a week 6000-10000 step.  I immediately lay on mat doing exercises I was shown form other PT in past after finishing on bike. May need to have my left hip replaced pain and stiffness there.  I feel stiff in my back and hip. Weakness has been significantly decreasing since the summer. Tested for ALS neg  PERTINENT HISTORY:  R THR  PAIN:  Are you having pain? no: NPRS scale: current 0/10 Pain location:  Pain description:  Aggravating factors: walking on treadmill Relieving factors: rest/ stretching, meds  PRECAUTIONS: None  RED FLAGS: None   WEIGHT BEARING RESTRICTIONS: No  FALLS:  Has patient fallen in last 6 months? No  LIVING ENVIRONMENT: Lives with: lives alone Lives in: House/apartment Stairs: Yes: External: 8 steps;  on right going up Has following equipment at home: None  OCCUPATION: retired last year;  PLOF: Independent  PATIENT GOALS: Increase strength, improve overall health, balance to do more  NEXT MD VISIT: end of March  OBJECTIVE:  Note: Objective measures were completed at Evaluation unless otherwise noted.  DIAGNOSTIC FINDINGS:  MRI lumbar spine dated October 08, 2018 shows type II lumbosacral transitional vertebrae. At L2-3 there is moderate left and mild to moderate right neural foraminal narrowing with contact of the exiting bilateral L2 nerve roots worse on the left than the right. There is moderate left subarticular zone narrowing with mild spinal canal stenosis impinging upon with mild to moderate displacement of the left L3 nerve root due to a very small central disc extrusion superimposed upon a severe bulging disc osteophyte complex asymmetric to the left. At L3-4 there is a disc extrusion superimposed upon a severe left lateral bulging disc osteophyte and severe left disc height loss resulting in moderate to severe left foraminal narrowing and impingement upon the exiting left L3 nerve root. Moderate left and mild right subarticular zone narrowing and mild spinal canal stenosis with impingement upon the descending left L4 nerve root. At L4-5 there is moderate to severe left and moderate right facet arthrosis. Mild dextroconvex lumbar scoliosis with apex at L3.   PATIENT SURVEYS:  ODI  14(2)=28/45= 62%  COGNITION: Overall cognitive status: Within functional limits for tasks assessed     SENSATION: dysesthesias posterior calf pain and pain in the sole of her foot  MUSCLE LENGTH: Hamstrings: tight   POSTURE: rounded shoulders and increased thoracic kyphosis   LUMBAR ROM:   wfl  LOWER EXTREMITY ROM:     wfl  LOWER EXTREMITY MMT:    MMT Right eval Left eval  Hip flexion 39.5 29.8  Hip extension    Hip abduction 13.9 18.0  Hip adduction    Hip internal rotation     Hip external rotation    Knee flexion    Knee extension 26.4 22.6  Ankle dorsiflexion    Ankle plantarflexion  Ankle inversion    Ankle eversion     (Blank rows = not tested)  LUMBAR SPECIAL TESTS:  Straight leg raise test: Negative and Slump test: Negative  FUNCTIONAL TESTS:  5 times sit to stand: 13.80 Timed up and go (TUG): 10.79 4 stage: passed 1&2.  Tandem x 5s; SLS x 7 05/18/23:  5x STS:  10.5s  GAIT: Distance walked: unlimited distance Assistive device utilized: None Level of assistance: Complete Independence Comments: wfl  TREATMENT                                                                                                                          [facility] Adult PT Treatment:                                                DATE: 06/15/23 Pt seen for aquatic therapy today.  Treatment took place in water 3.5-4.75 ft in depth at the [facility] pool. Temp of water was 91.  Pt entered/exited the pool via stairs with hand rail.  Exercises - walking forward/ backward   -farmers carry bilaterally then unilaterally forward and back - Side Stepping with or without Hand floats   - Forward March with Same Side/Opposite Arm Knee Taps to Hand Floats   - Pool noodle/  hand float pull down to front of thighs   - Staggered Stance Row with Kick Board - Hip Hinge with Kick board  - Single Leg Hip Hinge holding noodle      PATIENT EDUCATION:  Education details: aquatic therapy progression / modifications Person educated: Patient Education method: Explanation Education comprehension: verbalized understanding  HOME EXERCISE PROGRAM: AQUATIC Access Code: [email or web address] URL: [email or web address] Date: 06/10/2023 Prepared by: [facility] [address] This aquatic home exercise program from MedBridge utilizes pictures from land based exercises, but has been adapted prior to lamination and issuance.  * Printed not  issued  ASSESSMENT:  CLINICAL IMPRESSION: Began instruction on aquatic HEP.  VC and demonstration given.  Pt with improved strength throughout as demonstrated by decreased difficulty with completion of multiple exercises.  Her overall pain and spasm have reduced and she reports improved sleeping. She continues to demonstrated core weakness with superman pose.  Will continue to advance as tolerated.     Initial impression Patient is a 73 y.o. f who was seen today for physical therapy evaluation and treatment for Radiculopathy, lumbar region. She reports dysesthesias posterior calf pain and pain in the sole of her foot with associated significant calf cramping which has subsided since taking gabapentin.  Steroid injections in lumbar spine and r SIJ has significantly decreased LBP but she continues to have episodes of radicular discomfort in right anteromedial thigh to knee she reports as tightness and tingling.  She is an active senior but has become afraid of falling (has not  had any falls) due to an increase in general weakness and reports of general muscle atrophy. She does follow her own exercise program walking on treadmill x 40 mins + 5 days a week but does not do other things she enjoys doing outside her home due to fear of falling. Objective and functional testing demonstrate LE and core weakness.   She will benefit from skilled PT intervention to improve all areas of deficits and return pt to improved QOL. Will begin in aquatics then transition land based when approp.    OBJECTIVE IMPAIRMENTS: decreased activity tolerance, decreased balance, decreased endurance, decreased mobility, difficulty walking, and decreased strength.   ACTIVITY LIMITATIONS: carrying, lifting, bending, standing, squatting, stairs, transfers, and locomotion level  PARTICIPATION LIMITATIONS: cleaning, community activity, occupation, and yard work  PERSONAL FACTORS: Time since onset of injury/illness/exacerbation are  also affecting patient's functional outcome.   REHAB POTENTIAL: Good  CLINICAL DECISION MAKING: Evolving/moderate complexity  EVALUATION COMPLEXITY: Moderate   GOALS: Goals reviewed with patient? Yes  SHORT TERM GOALS: Target date: 05/10/23  Pt will tolerate full aquatic sessions consistently without increase in pain and with improving function to demonstrate good toleration and effectiveness of intervention.  Baseline: Goal status: MET - 05/18/23  2. Pt will perform SLS and tandem stance in 3.6 ft unsupported x 20s Baseline: see chart Goal INITIAL  3.Pt will report 50% decrease in fear of falling. Baseline: "afraid to come outside my box" Goal status: IN PROGRESS - 05/18/23  4.  Pt will improve on 5 X STS test to <or= 10s to demonstrate improving functional lower extremity strength, transitional movements, and balance Baseline: 13.80 at eval;  see above Goal status: Met- 05/18/23   LONG TERM GOALS: Target date: POC  Pt will improve on ODI by 25% to demonstrate improved function ability "to do more". Baseline: 62% Goal status: INITIAL  2.  Pt will report running vacuum cleaner (Complete house work) without limitation to fatigue or pain Baseline:  Goal status: INITIAL  3.  Pt to be able to take trash can to and from curb safely Baseline:  Goal status: INITIAL  4.  Pt will report decreased waking from cramps at night to 1x week or less Baseline: 3 Goal status: INITIAL  5.  Pt will improve strength in all areas listed by at least 10 lbs  to demonstrate improved overall physical function Baseline: see chart Goal status: INITIAL  6.   Pt will report ability to complete a "full workout" (completing cardio/ lifting)  Baseline:  Goal status: INITIAL  PLAN:  PT FREQUENCY: 2x/week  PT DURATION: 8 weeks  PLANNED INTERVENTIONS: 97164- PT Re-evaluation, 97110-Therapeutic exercises, 97530- Therapeutic activity, 97112- Neuromuscular re-education, 97535- Self Care, 16109-  Manual therapy, [phone]- Gait training, [phone]- Orthotic Fit/training, [phone]- Aquatic Therapy, 97014- Electrical stimulation (unattended), [phone]- Ionotophoresis 4mg /ml Dexamethasone, Patient/Family education, Balance training, Stair training, Taping, Dry Needling, Joint mobilization, DME instructions, Cryotherapy, and Moist heat.  PLAN FOR NEXT SESSION: aquatics: general strengthening; balance retraining HEP  Lucinda Saber) Idamae Coccia MPT 06/15/23 4:19 PM [facility] [address] [place], [place], [address] Phone: [phone]   Fax:  [phone]

## 2023-06-17 ENCOUNTER — Encounter (HOSPITAL_BASED_OUTPATIENT_CLINIC_OR_DEPARTMENT_OTHER): Payer: Self-pay | Admitting: Physical Therapy

## 2023-06-17 ENCOUNTER — Ambulatory Visit (HOSPITAL_BASED_OUTPATIENT_CLINIC_OR_DEPARTMENT_OTHER): Payer: Self-pay | Admitting: Physical Therapy

## 2023-06-17 DIAGNOSIS — R2689 Other abnormalities of gait and mobility: Secondary | ICD-10-CM | POA: Diagnosis not present

## 2023-06-17 DIAGNOSIS — M6281 Muscle weakness (generalized): Secondary | ICD-10-CM | POA: Diagnosis not present

## 2023-06-17 DIAGNOSIS — M5441 Lumbago with sciatica, right side: Secondary | ICD-10-CM | POA: Diagnosis not present

## 2023-06-17 DIAGNOSIS — M5459 Other low back pain: Secondary | ICD-10-CM

## 2023-06-17 DIAGNOSIS — M5442 Lumbago with sciatica, left side: Secondary | ICD-10-CM | POA: Diagnosis not present

## 2023-06-17 DIAGNOSIS — M7918 Myalgia, other site: Secondary | ICD-10-CM | POA: Diagnosis not present

## 2023-06-17 NOTE — Therapy (Signed)
 OUTPATIENT PHYSICAL THERAPY THORACOLUMBAR TREATMENT   Patient Name: Julie Tapia MRN: [id number] DOB:12-30-1950, 73 y.o., female Today's Date: 06/17/2023  END OF SESSION:  PT End of Session - 06/17/23 1212     Visit Number 14    Date for PT Re-Evaluation 07/30/23    PT Start Time 1015    PT Stop Time 1058    PT Time Calculation (min) 43 min    Activity Tolerance Patient tolerated treatment well    Behavior During Therapy [facility] for tasks assessed/performed                Past Medical History:  Diagnosis Date   Aneurysm (HCC) 01-16-11   "lt .side of head not intra(cavity area)/Found during series of tests to r/o TIA's"-report from [facility] showed  Lt. ICA  aneurysm   Arthritis 01-16-11   osteoarthritis,-knees, hips, ankles. 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 tight chested-"no inhalers in yrs.   Complication of anesthesia 01-16-11   Pt. very slow to awaken and was days before feeling like herself.   Past Surgical History:  Procedure Laterality Date   ABDOMINAL HYSTERECTOMY     BREAST CYST EXCISION  01-16-11   several excisions in the past   JOINT REPLACEMENT  01-16-11   Rt. great toe ball joint replacement-metal   REDUCTION MAMMAPLASTY  01-16-11   Bil. '86( problems with s/p surgical clot at rt. breast drainage site)   TOTAL HIP ARTHROPLASTY  01/20/2011   Procedure: TOTAL HIP ARTHROPLASTY ANTERIOR APPROACH;  Surgeon: Bevin Bucks;  Location: [facility];  Service: Orthopedics;  Laterality: Right;   Patient Active Problem List   Diagnosis Date Noted   Myofascial pain syndrome of lumbar spine 05/25/2023   Chronic bilateral low back pain with bilateral sciatica 05/25/2023   Muscle cramps 05/25/2023   Nerve pain due to spinal stenosis 05/25/2023   Benign fasciculations 05/25/2023   Cerebral aneurysm 01/12/2021   PFO (patent foramen ovale) 01/12/2021    PCP: Barnetta Liberty MD  REFERRING  PROVIDER:   Michele Ahle, MD    REFERRING DIAG: M54.16 (ICD-10-CM) - Radiculopathy, lumbar region   Rationale for Evaluation and Treatment: Rehabilitation  THERAPY DIAG:  Muscle weakness (generalized)  Other abnormalities of gait and mobility  Other low back pain  ONSET DATE: last year  SUBJECTIVE:                                                                                                                                                                                           SUBJECTIVE STATEMENT:  The patient reports her back has been feeling good. She has been working in the pool. She has very little pain today.   Initial Subjective Began/Back injection 2007-8.  Went dormant.  Returned 2020.  Summer 2024 hurt my back lifting case of water.  Have had back pain continual since. Drucie Ip has given injections in lumbar spine and L SIJ. Fasciculation/cramps were bad in my calves.  Better with gabapentin low dose.  Most nights I can sleep (2-3 x week not sleeping). I have atrophied a lot.  Have a treadmill that I walk on it 5/7 days a week 6000-10000 step.  I immediately lay on mat doing exercises I was shown form other PT in past after finishing on bike. May need to have my left hip replaced pain and stiffness there.  I feel stiff in my back and hip. Weakness has been significantly decreasing since the summer. Tested for ALS neg  PERTINENT HISTORY:  R THR  PAIN:  Are you having pain? no: NPRS scale: current 0/10 Pain location:  Pain description:  Aggravating factors: walking on treadmill Relieving factors: rest/ stretching, meds  PRECAUTIONS: None  RED FLAGS: None   WEIGHT BEARING RESTRICTIONS: No  FALLS:  Has patient fallen in last 6 months? No  LIVING ENVIRONMENT: Lives with: lives alone Lives in: House/apartment Stairs: Yes: External: 8 steps; on right going up Has following equipment at home: None  OCCUPATION: retired last year;  PLOF:  Independent  PATIENT GOALS: Increase strength, improve overall health, balance to do more  NEXT MD VISIT: end of March  OBJECTIVE:  Note: Objective measures were completed at Evaluation unless otherwise noted.  DIAGNOSTIC FINDINGS:  MRI lumbar spine dated October 08, 2018 shows type II lumbosacral transitional vertebrae. At L2-3 there is moderate left and mild to moderate right neural foraminal narrowing with contact of the exiting bilateral L2 nerve roots worse on the left than the right. There is moderate left subarticular zone narrowing with mild spinal canal stenosis impinging upon with mild to moderate displacement of the left L3 nerve root due to a very small central disc extrusion superimposed upon a severe bulging disc osteophyte complex asymmetric to the left. At L3-4 there is a disc extrusion superimposed upon a severe left lateral bulging disc osteophyte and severe left disc height loss resulting in moderate to severe left foraminal narrowing and impingement upon the exiting left L3 nerve root. Moderate left and mild right subarticular zone narrowing and mild spinal canal stenosis with impingement upon the descending left L4 nerve root. At L4-5 there is moderate to severe left and moderate right facet arthrosis. Mild dextroconvex lumbar scoliosis with apex at L3.   PATIENT SURVEYS:  ODI  14(2)=28/45= 62%  COGNITION: Overall cognitive status: Within functional limits for tasks assessed     SENSATION: dysesthesias posterior calf pain and pain in the sole of her foot  MUSCLE LENGTH: Hamstrings: tight   POSTURE: rounded shoulders and increased thoracic kyphosis   LUMBAR ROM:   wfl  LOWER EXTREMITY ROM:     wfl  LOWER EXTREMITY MMT:    MMT Right eval Left eval  Hip flexion 39.5 29.8  Hip extension    Hip abduction 13.9 18.0  Hip adduction    Hip internal rotation    Hip external rotation    Knee flexion    Knee extension 26.4 22.6  Ankle dorsiflexion    Ankle  plantarflexion    Ankle inversion    Ankle eversion     (  Blank rows = not tested)  LUMBAR SPECIAL TESTS:  Straight leg raise test: Negative and Slump test: Negative  FUNCTIONAL TESTS:  5 times sit to stand: 13.80 Timed up and go (TUG): 10.79 4 stage: passed 1&2.  Tandem x 5s; SLS x 7 05/18/23:  5x STS:  10.5s  GAIT: Distance walked: unlimited distance Assistive device utilized: None Level of assistance: Complete Independence Comments: wfl  TREATMENT                                                                                                                          4/17  There-ex  Nu-step 5 min L5  Reviewed and updated HEP  Reviewed the anatomy of stenosis an transverse abdominal muscles    Nuero re-ed     [facility] Adult PT Treatment:                                                DATE: 06/15/23 Pt seen for aquatic therapy today.  Treatment took place in water 3.5-4.75 ft in depth at the [facility] pool. Temp of water was 91.  Pt entered/exited the pool via stairs with hand rail.  Exercises - walking forward/ backward   -farmers carry bilaterally then unilaterally forward and back - Side Stepping with or without Hand floats   - Forward March with Same Side/Opposite Arm Knee Taps to Hand Floats   - Pool noodle/  hand float pull down to front of thighs   - Staggered Stance Row with Kick Board - Hip Hinge with Kick board  - Single Leg Hip Hinge holding noodle      PATIENT EDUCATION:  Education details: aquatic therapy progression / modifications Person educated: Patient Education method: Explanation Education comprehension: verbalized understanding  HOME EXERCISE PROGRAM: AQUATIC Access Code: [email or web address] URL: [email or web address] Date: 06/10/2023 Prepared by: [facility] [address] This aquatic home exercise program from MedBridge utilizes pictures from land based exercises, but has been adapted prior to lamination and  issuance.  * Printed not issued   [id number]  ASSESSMENT:  CLINICAL IMPRESSION: The patient tolerated treatment well. We expanded her core exercises. We reviewed a supine series. She has left hip OA. We had to adjust her supine march to not irriate the OA. We also began working on gyn exercises. We reviewed use of RPE to grade her exercises. She had a low RPE with her hip  abduction so we advanced her to a green band. We kept her RPE around a 6/10 with her gym exercises. We will continue to expand her program as tolerated.     Initial impression Patient is a 73 y.o. f who was seen today for physical therapy evaluation and treatment for Radiculopathy, lumbar region. She reports dysesthesias posterior calf pain and pain in the sole of her foot with associated significant calf  cramping which has subsided since taking gabapentin.  Steroid injections in lumbar spine and r SIJ has significantly decreased LBP but she continues to have episodes of radicular discomfort in right anteromedial thigh to knee she reports as tightness and tingling.  She is an active senior but has become afraid of falling (has not had any falls) due to an increase in general weakness and reports of general muscle atrophy. She does follow her own exercise program walking on treadmill x 40 mins + 5 days a week but does not do other things she enjoys doing outside her home due to fear of falling. Objective and functional testing demonstrate LE and core weakness.   She will benefit from skilled PT intervention to improve all areas of deficits and return pt to improved QOL. Will begin in aquatics then transition land based when approp.    OBJECTIVE IMPAIRMENTS: decreased activity tolerance, decreased balance, decreased endurance, decreased mobility, difficulty walking, and decreased strength.   ACTIVITY LIMITATIONS: carrying, lifting, bending, standing, squatting, stairs, transfers, and locomotion level  PARTICIPATION LIMITATIONS:  cleaning, community activity, occupation, and yard work  PERSONAL FACTORS: Time since onset of injury/illness/exacerbation are also affecting patient's functional outcome.   REHAB POTENTIAL: Good  CLINICAL DECISION MAKING: Evolving/moderate complexity  EVALUATION COMPLEXITY: Moderate   GOALS: Goals reviewed with patient? Yes  SHORT TERM GOALS: Target date: 05/10/23  Pt will tolerate full aquatic sessions consistently without increase in pain and with improving function to demonstrate good toleration and effectiveness of intervention.  Baseline: Goal status: MET - 05/18/23  2. Pt will perform SLS and tandem stance in 3.6 ft unsupported x 20s Baseline: see chart Goal INITIAL  3.Pt will report 50% decrease in fear of falling. Baseline: "afraid to come outside my box" Goal status: IN PROGRESS - 05/18/23  4.  Pt will improve on 5 X STS test to <or= 10s to demonstrate improving functional lower extremity strength, transitional movements, and balance Baseline: 13.80 at eval;  see above Goal status: Met- 05/18/23   LONG TERM GOALS: Target date: POC  Pt will improve on ODI by 25% to demonstrate improved function ability "to do more". Baseline: 62% Goal status: INITIAL  2.  Pt will report running vacuum cleaner (Complete house work) without limitation to fatigue or pain Baseline:  Goal status: INITIAL  3.  Pt to be able to take trash can to and from curb safely Baseline:  Goal status: INITIAL  4.  Pt will report decreased waking from cramps at night to 1x week or less Baseline: 3 Goal status: INITIAL  5.  Pt will improve strength in all areas listed by at least 10 lbs  to demonstrate improved overall physical function Baseline: see chart Goal status: INITIAL  6.   Pt will report ability to complete a "full workout" (completing cardio/ lifting)  Baseline:  Goal status: INITIAL  PLAN:  PT FREQUENCY: 2x/week  PT DURATION: 8 weeks  PLANNED INTERVENTIONS: 97164- PT  Re-evaluation, 97110-Therapeutic exercises, 97530- Therapeutic activity, 97112- Neuromuscular re-education, 97535- Self Care, 16109- Manual therapy, [phone]- Gait training, [phone]- Orthotic Fit/training, [phone]- Aquatic Therapy, 97014- Electrical stimulation (unattended), [phone]- Ionotophoresis 4mg /ml Dexamethasone, Patient/Family education, Balance training, Stair training, Taping, Dry Needling, Joint mobilization, DME instructions, Cryotherapy, and Moist heat.  PLAN FOR NEXT SESSION: aquatics: general strengthening; balance retraining HEP    Lorayne Bender PT DPT  06/17/23 12:43 PM [facility] [address] [place], [place], [address] Phone: [phone]   Fax:  [phone]

## 2023-06-19 ENCOUNTER — Encounter (HOSPITAL_BASED_OUTPATIENT_CLINIC_OR_DEPARTMENT_OTHER): Payer: Self-pay | Admitting: Physical Therapy

## 2023-06-23 ENCOUNTER — Ambulatory Visit (HOSPITAL_BASED_OUTPATIENT_CLINIC_OR_DEPARTMENT_OTHER): Admitting: Physical Therapy

## 2023-06-23 ENCOUNTER — Encounter (HOSPITAL_BASED_OUTPATIENT_CLINIC_OR_DEPARTMENT_OTHER): Payer: Self-pay | Admitting: Physical Therapy

## 2023-06-23 DIAGNOSIS — R2689 Other abnormalities of gait and mobility: Secondary | ICD-10-CM | POA: Diagnosis not present

## 2023-06-23 DIAGNOSIS — M5442 Lumbago with sciatica, left side: Secondary | ICD-10-CM | POA: Diagnosis not present

## 2023-06-23 DIAGNOSIS — M6281 Muscle weakness (generalized): Secondary | ICD-10-CM | POA: Diagnosis not present

## 2023-06-23 DIAGNOSIS — M5459 Other low back pain: Secondary | ICD-10-CM | POA: Diagnosis not present

## 2023-06-23 DIAGNOSIS — M5441 Lumbago with sciatica, right side: Secondary | ICD-10-CM | POA: Diagnosis not present

## 2023-06-23 DIAGNOSIS — M7918 Myalgia, other site: Secondary | ICD-10-CM | POA: Diagnosis not present

## 2023-06-23 NOTE — Therapy (Signed)
 OUTPATIENT PHYSICAL THERAPY THORACOLUMBAR TREATMENT   Patient Name: Julie Tapia MRN: [id number] DOB:26-May-1950, 73 y.o., female Today's Date: 06/23/2023  END OF SESSION:  PT End of Session - 06/23/23 0855     Visit Number 15    Date for PT Re-Evaluation 07/30/23    Authorization Type mcr    Progress Note Due on Visit 20    PT Start Time 0845    PT Stop Time 0925    PT Time Calculation (min) 40 min    Activity Tolerance Patient tolerated treatment well    Behavior During Therapy [facility] for tasks assessed/performed                Past Medical History:  Diagnosis Date   Aneurysm (HCC) 01-16-11   "lt .side of head not intra(cavity area)/Found during series of tests to r/o TIA's"-report from [facility] showed  Lt. ICA  aneurysm   Arthritis 01-16-11   osteoarthritis,-knees, hips, ankles. 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 tight chested-"no inhalers in yrs.   Complication of anesthesia 01-16-11   Pt. very slow to awaken and was days before feeling like herself.   Past Surgical History:  Procedure Laterality Date   ABDOMINAL HYSTERECTOMY     BREAST CYST EXCISION  01-16-11   several excisions in the past   JOINT REPLACEMENT  01-16-11   Rt. great toe ball joint replacement-metal   REDUCTION MAMMAPLASTY  01-16-11   Bil. '86( problems with s/p surgical clot at rt. breast drainage site)   TOTAL HIP ARTHROPLASTY  01/20/2011   Procedure: TOTAL HIP ARTHROPLASTY ANTERIOR APPROACH;  Surgeon: Bevin Bucks;  Location: [facility];  Service: Orthopedics;  Laterality: Right;   Patient Active Problem List   Diagnosis Date Noted   Myofascial pain syndrome of lumbar spine 05/25/2023   Chronic bilateral low back pain with bilateral sciatica 05/25/2023   Muscle cramps 05/25/2023   Nerve pain due to spinal stenosis 05/25/2023   Benign fasciculations 05/25/2023   Cerebral aneurysm 01/12/2021   PFO (patent foramen  ovale) 01/12/2021    PCP: Barnetta Liberty MD  REFERRING PROVIDER:   Michele Ahle, MD    REFERRING DIAG: M54.16 (ICD-10-CM) - Radiculopathy, lumbar region   Rationale for Evaluation and Treatment: Rehabilitation  THERAPY DIAG:  Muscle weakness (generalized)  Other abnormalities of gait and mobility  Other low back pain  ONSET DATE: last year  SUBJECTIVE:  SUBJECTIVE STATEMENT: "I'm feeling more like myself". Pt reports 75% improvement in fear of falling and she is able to take trash can to / from curb safely.   Initial Subjective Began/Back injection 2007-8.  Went dormant.  Returned 2020.  Summer 2024 hurt my back lifting case of water.  Have had back pain continual since. Phares Brasher has given injections in lumbar spine and L SIJ. Fasciculation/cramps were bad in my calves.  Better with gabapentin  low dose.  Most nights I can sleep (2-3 x week not sleeping). I have atrophied a lot.  Have a treadmill that I walk on it 5/7 days a week 6000-10000 step.  I immediately lay on mat doing exercises I was shown form other PT in past after finishing on bike. May need to have my left hip replaced pain and stiffness there.  I feel stiff in my back and hip. Weakness has been significantly decreasing since the summer. Tested for ALS neg  PERTINENT HISTORY:  R THR  PAIN:  Are you having pain? no: NPRS scale: current 0/10 Pain location:  Pain description:  Aggravating factors: walking on treadmill Relieving factors: rest/ stretching, meds  PRECAUTIONS: None  RED FLAGS: None   WEIGHT BEARING RESTRICTIONS: No  FALLS:  Has patient fallen in last 6 months? No  LIVING ENVIRONMENT: Lives with: lives alone Lives in: House/apartment Stairs: Yes: External: 8 steps; on right going up Has following equipment  at home: None  OCCUPATION: retired last year;  PLOF: Independent  PATIENT GOALS: Increase strength, improve overall health, balance to do more  NEXT MD VISIT: end of March  OBJECTIVE:  Note: Objective measures were completed at Evaluation unless otherwise noted.  DIAGNOSTIC FINDINGS:  MRI lumbar spine dated October 08, 2018 shows type II lumbosacral transitional vertebrae. At L2-3 there is moderate left and mild to moderate right neural foraminal narrowing with contact of the exiting bilateral L2 nerve roots worse on the left than the right. There is moderate left subarticular zone narrowing with mild spinal canal stenosis impinging upon with mild to moderate displacement of the left L3 nerve root due to a very small central disc extrusion superimposed upon a severe bulging disc osteophyte complex asymmetric to the left. At L3-4 there is a disc extrusion superimposed upon a severe left lateral bulging disc osteophyte and severe left disc height loss resulting in moderate to severe left foraminal narrowing and impingement upon the exiting left L3 nerve root. Moderate left and mild right subarticular zone narrowing and mild spinal canal stenosis with impingement upon the descending left L4 nerve root. At L4-5 there is moderate to severe left and moderate right facet arthrosis. Mild dextroconvex lumbar scoliosis with apex at L3.   PATIENT SURVEYS:  ODI  14(2)=28/45= 62%  COGNITION: Overall cognitive status: Within functional limits for tasks assessed     SENSATION: dysesthesias posterior calf pain and pain in the sole of her foot  MUSCLE LENGTH: Hamstrings: tight   POSTURE: rounded shoulders and increased thoracic kyphosis   LUMBAR ROM:   wfl  LOWER EXTREMITY ROM:     wfl  LOWER EXTREMITY MMT:    MMT Right eval Left eval  Hip flexion 39.5 29.8  Hip extension    Hip abduction 13.9 18.0  Hip adduction    Hip internal rotation    Hip external rotation    Knee flexion     Knee extension 26.4 22.6  Ankle dorsiflexion    Ankle plantarflexion    Ankle inversion  Ankle eversion     (Blank rows = not tested)  LUMBAR SPECIAL TESTS:  Straight leg raise test: Negative and Slump test: Negative  FUNCTIONAL TESTS:  5 times sit to stand: 13.80 Timed up and go (TUG): 10.79 4 stage: passed 1&2.  Tandem x 5s; SLS x 7 05/18/23:  5x STS:  10.5s 06/23/23: in 57ft6" water: tandem stance 20sec each LE;  SLS -R 20s, L up to 14s (challenge)  GAIT: Distance walked: unlimited distance Assistive device utilized: None Level of assistance: Complete Independence Comments: wfl  TREATMENT                                                                                                                          [facility] Adult PT Treatment:                                                DATE: 06/23/23 Pt seen for aquatic therapy today.  Treatment took place in water 3.5-4.75 ft in depth at the [facility] pool. Temp of water was 91.  Pt entered/exited the pool via stairs with hand rail.   -farmers carry with yellow hand floats bilaterally then unilaterally forward and back - Side Stepping with rainbow Hand floats   - Forward March with Same Side/Opposite Arm Knee Taps to rainbow Hand Floats   - Pool noodle(long hollow) /  rainbow and yellow hand float pull down to front of thighs -> press down on yellow noodle - Staggered Stance Row with [employer], cues for increased speed - tandem stance with horiz head turns, 4 ft and 72ft6"; SLS in 5ft6" (see above) - Hip Hinge with yellow noodle x 10  - Single Leg superman x 10 each LE (demo and cues for form) - 3 way LE stretch with hollow noodle (cramp in LE)-> hamstring stretch with foot on 2nd/3rd step 15s each LE; adductor stretch x 15s each LE  4/17  There-ex  Nu-step 5 min L5  Reviewed and updated HEP  Reviewed the anatomy of stenosis an transverse abdominal muscles    Nuero re-ed     [facility] Adult PT Treatment:                                                 DATE: 06/15/23 Pt seen for aquatic therapy today.  Treatment took place in water 3.5-4.75 ft in depth at the [facility] pool. Temp of water was 91.  Pt entered/exited the pool via stairs with hand rail.  Exercises - walking forward/ backward   -farmers carry bilaterally then unilaterally forward and back - Side Stepping with or without Hand floats   - Forward March with Same Side/Opposite Arm Knee Taps  to Hand Floats   - Pool noodle/  hand float pull down to front of thighs   - Staggered Stance Row with Kick Board - Hip Hinge with Kick board  - Single Leg Hip Hinge holding noodle      PATIENT EDUCATION:  Education details: aquatic therapy progression / modifications Person educated: Patient Education method: Explanation Education comprehension: verbalized understanding  HOME EXERCISE PROGRAM: AQUATIC Access Code: [email or web address] URL: [email or web address] Date: 06/23/2023 Prepared by: [facility] - [address] This aquatic home exercise program from [employer] utilizes pictures from land based exercises, but has been adapted prior to lamination and issuance.     [id number]  ASSESSMENT:  CLINICAL IMPRESSION: Pt has met STG 3 and LTG 3 . L SLS remains a challenge; reports fatigue in LLE afterwards. Pt instructed on aquatic HEP; laminated copy will be issued on next visit.  Pt declined to complete suspended cycling due to cramps in LEs;  3 way LE stretch with noodle caused spasm in LE. All other exercises were tolerated well. Will plan to continue to expand her land program as tolerated.     Initial impression Patient is a 73 y.o. f who was seen today for physical therapy evaluation and treatment for Radiculopathy, lumbar region. She reports dysesthesias posterior calf pain and pain in the sole of her foot with associated significant calf cramping which has subsided since taking gabapentin .  Steroid injections  in lumbar spine and r SIJ has significantly decreased LBP but she continues to have episodes of radicular discomfort in right anteromedial thigh to knee she reports as tightness and tingling.  She is an active senior but has become afraid of falling (has not had any falls) due to an increase in general weakness and reports of general muscle atrophy. She does follow her own exercise program walking on treadmill x 40 mins + 5 days a week but does not do other things she enjoys doing outside her home due to fear of falling. Objective and functional testing demonstrate LE and core weakness.   She will benefit from skilled PT intervention to improve all areas of deficits and return pt to improved QOL. Will begin in aquatics then transition land based when approp.    OBJECTIVE IMPAIRMENTS: decreased activity tolerance, decreased balance, decreased endurance, decreased mobility, difficulty walking, and decreased strength.   ACTIVITY LIMITATIONS: carrying, lifting, bending, standing, squatting, stairs, transfers, and locomotion level  PARTICIPATION LIMITATIONS: cleaning, community activity, occupation, and yard work  PERSONAL FACTORS: Time since onset of injury/illness/exacerbation are also affecting patient's functional outcome.   REHAB POTENTIAL: Good  CLINICAL DECISION MAKING: Evolving/moderate complexity  EVALUATION COMPLEXITY: Moderate   GOALS: Goals reviewed with patient? Yes  SHORT TERM GOALS: Target date: 05/10/23  Pt will tolerate full aquatic sessions consistently without increase in pain and with improving function to demonstrate good toleration and effectiveness of intervention.  Baseline: Goal status: MET - 05/18/23  2. Pt will perform SLS and tandem stance in 3.6 ft unsupported x 20s Baseline: see chart Goal status:  Partially met -06/23/23  3.Pt will report 50% decrease in fear of falling. Baseline: Goal status: MET- 06/23/23  4.  Pt will improve on 5 X STS test to <or= 10s to  demonstrate improving functional lower extremity strength, transitional movements, and balance Baseline: 13.80 at eval;  see above Goal status: Met- 05/18/23   LONG TERM GOALS: Target date: POC  Pt will improve on ODI by 25% to demonstrate improved function ability "to do more". Baseline:  62% Goal status: INITIAL  2.  Pt will report running vacuum cleaner (Complete house work) without limitation to fatigue or pain Baseline:  Goal status: INITIAL  3.  Pt to be able to take trash can to and from curb safely Baseline:  Goal status: MET - 06/23/23  4.  Pt will report decreased waking from cramps at night to 1x week or less Baseline: 3 at eval; varies - 06/23/23 Goal status: IN PROGRESS   5.  Pt will improve strength in all areas listed by at least 10 lbs  to demonstrate improved overall physical function Baseline: see chart Goal status: INITIAL  6.   Pt will report ability to complete a "full workout" (completing cardio/ lifting)  Baseline:  Goal status: INITIAL  PLAN:  PT FREQUENCY: 2x/week  PT DURATION: 8 weeks  PLANNED INTERVENTIONS: 97164- PT Re-evaluation, 97110-Therapeutic exercises, 97530- Therapeutic activity, 97112- Neuromuscular re-education, 97535- Self Care, 95621- Manual therapy, [id number]- Gait training, [phone]- Orthotic Fit/training, [phone]- Aquatic Therapy, 97014- Electrical stimulation (unattended), [phone]- Ionotophoresis 4mg /ml Dexamethasone , Patient/Family education, Balance training, Stair training, Taping, Dry Needling, Joint mobilization, DME instructions, Cryotherapy, and Moist heat.  PLAN FOR NEXT SESSION: aquatics: general strengthening; balance retraining HEP  Almedia Jacobsen, PTA 06/23/23 9:23 AM [facility] [address] [place], [place], [address] Phone: [phone]   Fax:  [phone]

## 2023-06-25 ENCOUNTER — Ambulatory Visit (HOSPITAL_BASED_OUTPATIENT_CLINIC_OR_DEPARTMENT_OTHER): Admitting: Physical Therapy

## 2023-06-25 DIAGNOSIS — M6281 Muscle weakness (generalized): Secondary | ICD-10-CM

## 2023-06-25 DIAGNOSIS — R2689 Other abnormalities of gait and mobility: Secondary | ICD-10-CM | POA: Diagnosis not present

## 2023-06-25 DIAGNOSIS — M5441 Lumbago with sciatica, right side: Secondary | ICD-10-CM | POA: Diagnosis not present

## 2023-06-25 DIAGNOSIS — M5459 Other low back pain: Secondary | ICD-10-CM

## 2023-06-25 DIAGNOSIS — M5442 Lumbago with sciatica, left side: Secondary | ICD-10-CM | POA: Diagnosis not present

## 2023-06-25 DIAGNOSIS — M7918 Myalgia, other site: Secondary | ICD-10-CM | POA: Diagnosis not present

## 2023-06-25 NOTE — Therapy (Signed)
 OUTPATIENT PHYSICAL THERAPY THORACOLUMBAR TREATMENT   Patient Name: Julie Tapia MRN: [id number] DOB:[birth year], 73 y.o., female Today's Date: 06/25/2023  END OF SESSION:  PT End of Session - 06/25/23 0929     Visit Number 16    Date for PT Re-Evaluation 07/30/23    Authorization Type mcr    Progress Note Due on Visit 20    PT Start Time 0930    PT Stop Time 1010    PT Time Calculation (min) 40 min    Activity Tolerance Patient tolerated treatment well    Behavior During Therapy [facility] for tasks assessed/performed              Past Medical History:  Diagnosis Date   Aneurysm (HCC) 01-16-11   "lt .side of head not intra(cavity area)/Found during series of tests to r/o TIA's"-report from [facility] showed  Lt. ICA  aneurysm   Arthritis 01-16-11   osteoarthritis,-knees, hips, ankles. 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 tight chested-"no inhalers in yrs.   Complication of anesthesia 01-16-11   Pt. very slow to awaken and was days before feeling like herself.   Past Surgical History:  Procedure Laterality Date   ABDOMINAL HYSTERECTOMY     BREAST CYST EXCISION  01-16-11   several excisions in the past   JOINT REPLACEMENT  01-16-11   Rt. great toe ball joint replacement-metal   REDUCTION MAMMAPLASTY  01-16-11   Bil. '86( problems with s/p surgical clot at rt. breast drainage site)   TOTAL HIP ARTHROPLASTY  01/20/2011   Procedure: TOTAL HIP ARTHROPLASTY ANTERIOR APPROACH;  Surgeon: Bevin Bucks;  Location: [facility];  Service: Orthopedics;  Laterality: Right;   Patient Active Problem List   Diagnosis Date Noted   Myofascial pain syndrome of lumbar spine 05/25/2023   Chronic bilateral low back pain with bilateral sciatica 05/25/2023   Muscle cramps 05/25/2023   Nerve pain due to spinal stenosis 05/25/2023   Benign fasciculations 05/25/2023   Cerebral aneurysm 01/12/2021   PFO (patent foramen ovale)  01/12/2021    PCP: Barnetta Liberty MD  REFERRING PROVIDER:   Michele Ahle, MD    REFERRING DIAG: M54.16 (ICD-10-CM) - Radiculopathy, lumbar region   Rationale for Evaluation and Treatment: Rehabilitation  THERAPY DIAG:  No diagnosis found.  ONSET DATE: last year  SUBJECTIVE:                                                                                                                                                                                           SUBJECTIVE  STATEMENT: "I feel that I can finally enjoy retirement." States she has an appointment for a possible L THA.   Initial Subjective Began/Back injection 2007-8.  Went dormant.  Returned 2020.  Summer 2024 hurt my back lifting case of water.  Have had back pain continual since. Phares Brasher has given injections in lumbar spine and L SIJ. Fasciculation/cramps were bad in my calves.  Better with gabapentin  low dose.  Most nights I can sleep (2-3 x week not sleeping). I have atrophied a lot.  Have a treadmill that I walk on it 5/7 days a week 6000-10000 step.  I immediately lay on mat doing exercises I was shown form other PT in past after finishing on bike. May need to have my left hip replaced pain and stiffness there.  I feel stiff in my back and hip. Weakness has been significantly decreasing since the summer. Tested for ALS neg  PERTINENT HISTORY:  R THR  PAIN:  Are you having pain? no: NPRS scale: current 0/10 Pain location:  Pain description:  Aggravating factors: walking on treadmill Relieving factors: rest/ stretching, meds  PRECAUTIONS: Julie Tapia  RED FLAGS: Julie Tapia   WEIGHT BEARING RESTRICTIONS: No  FALLS:  Has patient fallen in last 6 months? No  LIVING ENVIRONMENT: Lives with: lives alone Lives in: House/apartment Stairs: Yes: External: 8 steps; on right going up Has following equipment at home: Julie Tapia  OCCUPATION: retired last year;  PLOF: Independent  PATIENT GOALS: Increase strength, improve  overall health, balance to do more  NEXT MD VISIT: end of March  OBJECTIVE:  Note: Objective measures were completed at Evaluation unless otherwise noted.  DIAGNOSTIC FINDINGS:  MRI lumbar spine dated October 08, 2018 shows type II lumbosacral transitional vertebrae. At L2-3 there is moderate left and mild to moderate right neural foraminal narrowing with contact of the exiting bilateral L2 nerve roots worse on the left than the right. There is moderate left subarticular zone narrowing with mild spinal canal stenosis impinging upon with mild to moderate displacement of the left L3 nerve root due to a very small central disc extrusion superimposed upon a severe bulging disc osteophyte complex asymmetric to the left. At L3-4 there is a disc extrusion superimposed upon a severe left lateral bulging disc osteophyte and severe left disc height loss resulting in moderate to severe left foraminal narrowing and impingement upon the exiting left L3 nerve root. Moderate left and mild right subarticular zone narrowing and mild spinal canal stenosis with impingement upon the descending left L4 nerve root. At L4-5 there is moderate to severe left and moderate right facet arthrosis. Mild dextroconvex lumbar scoliosis with apex at L3.   PATIENT SURVEYS:  ODI  14(2)=28/45= 62%  COGNITION: Overall cognitive status: Within functional limits for tasks assessed     SENSATION: dysesthesias posterior calf pain and pain in the sole of her foot  MUSCLE LENGTH: Hamstrings: tight   POSTURE: rounded shoulders and increased thoracic kyphosis   LUMBAR ROM:  wfl  LOWER EXTREMITY ROM:   wfl  LOWER EXTREMITY MMT:    MMT Right eval Left eval  Hip flexion 39.5 29.8  Hip extension    Hip abduction 13.9 18.0  Hip adduction    Hip internal rotation    Hip external rotation    Knee flexion    Knee extension 26.4 22.6  Ankle dorsiflexion    Ankle plantarflexion    Ankle inversion    Ankle eversion     (Blank  rows = not tested)  LUMBAR SPECIAL TESTS:  Straight leg raise test: Negative and Slump test: Negative  FUNCTIONAL TESTS:  5 times sit to stand: 13.80 Timed up and go (TUG): 10.79 4 stage: passed 1&2.  Tandem x 5s; SLS x 7 05/18/23:  5x STS:  10.5s 06/23/23: in 47ft6" water: tandem stance 20sec each LE;  SLS -R 20s, L up to 14s (challenge)  GAIT: Distance walked: unlimited distance Assistive device utilized: Julie Tapia Level of assistance: Complete Independence Comments: wfl  TREATMENT                                                                                                                          [facility] Adult PT Treatment:                                                DATE: 06/25/23 Neuromuscular re-ed: PPT + wall slide x10 PPT + wall squat + marching 2x10 Standing slow march 10x3" working on pelvic stability Standing SLS 3x15" Therapeutic Activity: Nustep L5 x 6 min UEs/LEs Side step red TB x5 laps by counter Fwd/bwd monster walk red TB x5 laps by counter Forward march + alternating arms red TB x10; however, started to irritate L SI Self Care: HEP updates   [facility] Adult PT Treatment:                                                DATE: 06/23/23 Pt seen for aquatic therapy today.  Treatment took place in water 3.5-4.75 ft in depth at the [facility] pool. Temp of water was 91.  Pt entered/exited the pool via stairs with hand rail.   -farmers carry with yellow hand floats bilaterally then unilaterally forward and back - Side Stepping with rainbow Hand floats   - Forward March with Same Side/Opposite Arm Knee Taps to rainbow Hand Floats   - Pool noodle(long hollow) /  rainbow and yellow hand float pull down to front of thighs -> press down on yellow noodle - Staggered Stance Row with [employer], cues for increased speed - tandem stance with horiz head turns, 4 ft and 54ft6"; SLS in 24ft6" (see above) - Hip Hinge with yellow noodle x 10  - Single Leg superman x 10 each LE  (demo and cues for form) - 3 way LE stretch with hollow noodle (cramp in LE)-> hamstring stretch with foot on 2nd/3rd step 15s each LE; adductor stretch x 15s each LE  4/17  There-ex  Nu-step 5 min L5  Reviewed and updated HEP  Reviewed the anatomy of stenosis an transverse abdominal muscles    Nuero re-ed     [facility] Adult PT Treatment:  DATE: 06/15/23 Pt seen for aquatic therapy today.  Treatment took place in water 3.5-4.75 ft in depth at the [facility] pool. Temp of water was 91.  Pt entered/exited the pool via stairs with hand rail.  Exercises - walking forward/ backward   -farmers carry bilaterally then unilaterally forward and back - Side Stepping with or without Hand floats   - Forward March with Same Side/Opposite Arm Knee Taps to Hand Floats   - Pool noodle/  hand float pull down to front of thighs   - Staggered Stance Row with Kick Board - Hip Hinge with Kick board  - Single Leg Hip Hinge holding noodle      PATIENT EDUCATION:  Education details: aquatic therapy progression / modifications Person educated: Patient Education method: Explanation Education comprehension: verbalized understanding  HOME EXERCISE PROGRAM: AQUATIC Access Code: [email or web address] URL: [email or web address] Date: 06/23/2023 Prepared by: [facility] This aquatic home exercise program from [employer] utilizes pictures from land based exercises, but has been adapted prior to lamination and issuance.     [id number]  ASSESSMENT:  CLINICAL IMPRESSION: Worked on land exercise equivalents to her current aquatics HEP. Continued to work on core and hip strengthening and stability. Focus of session working on weight shifting with pelvic stability and not irritating her L SI. Able to perform weight shifts and single leg stabilization without trendelenburg or compensatory patterns given visual cues without  pain in her SI.   Initial impression Patient is a 73 y.o. f who was seen today for physical therapy evaluation and treatment for Radiculopathy, lumbar region. She reports dysesthesias posterior calf pain and pain in the sole of her foot with associated significant calf cramping which has subsided since taking gabapentin .  Steroid injections in lumbar spine and r SIJ has significantly decreased LBP but she continues to have episodes of radicular discomfort in right anteromedial thigh to knee she reports as tightness and tingling.  She is an active senior but has become afraid of falling (has not had any falls) due to an increase in general weakness and reports of general muscle atrophy. She does follow her own exercise program walking on treadmill x 40 mins + 5 days a week but does not do other things she enjoys doing outside her home due to fear of falling. Objective and functional testing demonstrate LE and core weakness.   She will benefit from skilled PT intervention to improve all areas of deficits and return pt to improved QOL. Will begin in aquatics then transition land based when approp.    OBJECTIVE IMPAIRMENTS: decreased activity tolerance, decreased balance, decreased endurance, decreased mobility, difficulty walking, and decreased strength.     GOALS: Goals reviewed with patient? Yes  SHORT TERM GOALS: Target date: 05/10/23  Pt will tolerate full aquatic sessions consistently without increase in pain and with improving function to demonstrate good toleration and effectiveness of intervention.  Baseline: Goal status: MET - 05/18/23  2. Pt will perform SLS and tandem stance in 3.6 ft unsupported x 20s Baseline: see chart Goal status:  Partially met -06/23/23  3.Pt will report 50% decrease in fear of falling. Baseline: Goal status: MET- 06/23/23  4.  Pt will improve on 5 X STS test to <or= 10s to demonstrate improving functional lower extremity strength, transitional movements, and  balance Baseline: 13.80 at eval;  see above Goal status: Met- 05/18/23   LONG TERM GOALS: Target date: POC  Pt will improve on ODI by 25% to demonstrate improved  function ability "to do more". Baseline: 62% Goal status: INITIAL  2.  Pt will report running vacuum cleaner (Complete house work) without limitation to fatigue or pain Baseline:  Goal status: INITIAL  3.  Pt to be able to take trash can to and from curb safely Baseline:  Goal status: MET - 06/23/23  4.  Pt will report decreased waking from cramps at night to 1x week or less Baseline: 3 at eval; varies - 06/23/23 Goal status: IN PROGRESS   5.  Pt will improve strength in all areas listed by at least 10 lbs  to demonstrate improved overall physical function Baseline: see chart Goal status: INITIAL  6.   Pt will report ability to complete a "full workout" (completing cardio/ lifting)  Baseline:  Goal status: INITIAL  PLAN:  PT FREQUENCY: 2x/week  PT DURATION: 8 weeks  PLANNED INTERVENTIONS: 97164- PT Re-evaluation, 97110-Therapeutic exercises, 97530- Therapeutic activity, 97112- Neuromuscular re-education, 97535- Self Care, 16109- Manual therapy, [phone]- Gait training, [phone]- Orthotic Fit/training, [phone]- Aquatic Therapy, 97014- Electrical stimulation (unattended), [phone]- Ionotophoresis 4mg /ml Dexamethasone , Patient/Family education, Balance training, Stair training, Taping, Dry Needling, Joint mobilization, DME instructions, Cryotherapy, and Moist heat.  PLAN FOR NEXT SESSION: aquatics: general strengthening; balance retraining HEP  Bradlee Heitman April Ma L Homer, PT 06/25/23 9:29 AM [facility] [address] [place], [place], [address] Phone: [phone]   Fax:  [phone]

## 2023-06-30 ENCOUNTER — Ambulatory Visit (HOSPITAL_BASED_OUTPATIENT_CLINIC_OR_DEPARTMENT_OTHER): Admitting: Physical Therapy

## 2023-06-30 ENCOUNTER — Encounter (HOSPITAL_BASED_OUTPATIENT_CLINIC_OR_DEPARTMENT_OTHER): Payer: Self-pay | Admitting: Physical Therapy

## 2023-06-30 DIAGNOSIS — M6281 Muscle weakness (generalized): Secondary | ICD-10-CM

## 2023-06-30 DIAGNOSIS — M5442 Lumbago with sciatica, left side: Secondary | ICD-10-CM | POA: Diagnosis not present

## 2023-06-30 DIAGNOSIS — R2689 Other abnormalities of gait and mobility: Secondary | ICD-10-CM

## 2023-06-30 DIAGNOSIS — M5459 Other low back pain: Secondary | ICD-10-CM

## 2023-06-30 DIAGNOSIS — M7918 Myalgia, other site: Secondary | ICD-10-CM | POA: Diagnosis not present

## 2023-06-30 DIAGNOSIS — M5441 Lumbago with sciatica, right side: Secondary | ICD-10-CM | POA: Diagnosis not present

## 2023-06-30 NOTE — Therapy (Signed)
 OUTPATIENT PHYSICAL THERAPY THORACOLUMBAR TREATMENT   Patient Name: Julie Tapia MRN: [id number] DOB:10/02/1950, 73 y.o., female Today's Date: 06/30/2023  END OF SESSION:  PT End of Session - 06/30/23 0939     Visit Number 17    Date for PT Re-Evaluation 07/30/23    Authorization Type mcr    Progress Note Due on Visit 20    PT Start Time 0931    PT Stop Time 1010    PT Time Calculation (min) 39 min    Activity Tolerance Patient tolerated treatment well    Behavior During Therapy [facility] for tasks assessed/performed              Past Medical History:  Diagnosis Date   Aneurysm (HCC) 01-16-11   "lt .side of head not intra(cavity area)/Found during series of tests to r/o TIA's"-report from [facility] showed  Lt. ICA  aneurysm   Arthritis 01-16-11   osteoarthritis,-knees, hips, ankles. 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 tight chested-"no inhalers in yrs.   Complication of anesthesia 01-16-11   Pt. very slow to awaken and was days before feeling like herself.   Past Surgical History:  Procedure Laterality Date   ABDOMINAL HYSTERECTOMY     BREAST CYST EXCISION  01-16-11   several excisions in the past   JOINT REPLACEMENT  01-16-11   Rt. great toe ball joint replacement-metal   REDUCTION MAMMAPLASTY  01-16-11   Bil. '86( problems with s/p surgical clot at rt. breast drainage site)   TOTAL HIP ARTHROPLASTY  01/20/2011   Procedure: TOTAL HIP ARTHROPLASTY ANTERIOR APPROACH;  Surgeon: Bevin Bucks;  Location: [facility];  Service: Orthopedics;  Laterality: Right;   Patient Active Problem List   Diagnosis Date Noted   Myofascial pain syndrome of lumbar spine 05/25/2023   Chronic bilateral low back pain with bilateral sciatica 05/25/2023   Muscle cramps 05/25/2023   Nerve pain due to spinal stenosis 05/25/2023   Benign fasciculations 05/25/2023   Cerebral aneurysm 01/12/2021   PFO (patent foramen ovale)  01/12/2021    PCP: Barnetta Liberty MD  REFERRING PROVIDER:   Michele Ahle, MD    REFERRING DIAG: M54.16 (ICD-10-CM) - Radiculopathy, lumbar region   Rationale for Evaluation and Treatment: Rehabilitation  THERAPY DIAG:  Muscle weakness (generalized)  Other abnormalities of gait and mobility  Other low back pain  ONSET DATE: last year  SUBJECTIVE:  SUBJECTIVE STATEMENT: Pt reports she has made significant progress with therapy.  Was dreading land based exercise "but it is going well"   Initial Subjective Began/Back injection 2007-8.  Went dormant.  Returned 2020.  Summer 2024 hurt my back lifting case of water.  Have had back pain continual since. Phares Brasher has given injections in lumbar spine and L SIJ. Fasciculation/cramps were bad in my calves.  Better with gabapentin  low dose.  Most nights I can sleep (2-3 x week not sleeping). I have atrophied a lot.  Have a treadmill that I walk on it 5/7 days a week 6000-10000 step.  I immediately lay on mat doing exercises I was shown form other PT in past after finishing on bike. May need to have my left hip replaced pain and stiffness there.  I feel stiff in my back and hip. Weakness has been significantly decreasing since the summer. Tested for ALS neg  PERTINENT HISTORY:  R THR  PAIN:  Are you having pain? no: NPRS scale: current 0/10 Pain location:  Pain description:  Aggravating factors: walking on treadmill Relieving factors: rest/ stretching, meds  PRECAUTIONS: None  RED FLAGS: None   WEIGHT BEARING RESTRICTIONS: No  FALLS:  Has patient fallen in last 6 months? No  LIVING ENVIRONMENT: Lives with: lives alone Lives in: House/apartment Stairs: Yes: External: 8 steps; on right going up Has following equipment at home:  None  OCCUPATION: retired last year;  PLOF: Independent  PATIENT GOALS: Increase strength, improve overall health, balance to do more  NEXT MD VISIT: end of March  OBJECTIVE:  Note: Objective measures were completed at Evaluation unless otherwise noted.  DIAGNOSTIC FINDINGS:  MRI lumbar spine dated October 08, 2018 shows type II lumbosacral transitional vertebrae. At L2-3 there is moderate left and mild to moderate right neural foraminal narrowing with contact of the exiting bilateral L2 nerve roots worse on the left than the right. There is moderate left subarticular zone narrowing with mild spinal canal stenosis impinging upon with mild to moderate displacement of the left L3 nerve root due to a very small central disc extrusion superimposed upon a severe bulging disc osteophyte complex asymmetric to the left. At L3-4 there is a disc extrusion superimposed upon a severe left lateral bulging disc osteophyte and severe left disc height loss resulting in moderate to severe left foraminal narrowing and impingement upon the exiting left L3 nerve root. Moderate left and mild right subarticular zone narrowing and mild spinal canal stenosis with impingement upon the descending left L4 nerve root. At L4-5 there is moderate to severe left and moderate right facet arthrosis. Mild dextroconvex lumbar scoliosis with apex at L3.   PATIENT SURVEYS:  ODI  14(2)=28/45= 62%  COGNITION: Overall cognitive status: Within functional limits for tasks assessed     SENSATION: dysesthesias posterior calf pain and pain in the sole of her foot  MUSCLE LENGTH: Hamstrings: tight   POSTURE: rounded shoulders and increased thoracic kyphosis   LUMBAR ROM:  wfl  LOWER EXTREMITY ROM:   wfl  LOWER EXTREMITY MMT:    MMT Right eval Left eval  Hip flexion 39.5 29.8  Hip extension    Hip abduction 13.9 18.0  Hip adduction    Hip internal rotation    Hip external rotation    Knee flexion    Knee extension  26.4 22.6  Ankle dorsiflexion    Ankle plantarflexion    Ankle inversion    Ankle eversion     (Blank rows =  not tested)  LUMBAR SPECIAL TESTS:  Straight leg raise test: Negative and Slump test: Negative  FUNCTIONAL TESTS:  5 times sit to stand: 13.80 Timed up and go (TUG): 10.79 4 stage: passed 1&2.  Tandem x 5s; SLS x 7 05/18/23:  5x STS:  10.5s 06/23/23: in 13ft6" water: tandem stance 20sec each LE;  SLS -R 20s, L up to 14s (challenge)  GAIT: Distance walked: unlimited distance Assistive device utilized: None Level of assistance: Complete Independence Comments: wfl  TREATMENT                                                                                                                           [facility] Adult PT Treatment:                                                DATE: 06/30/23 Pt seen for aquatic therapy today.  Treatment took place in water 3.5-4.75 ft in depth at the [facility] pool. Temp of water was 91.  Pt entered/exited the pool via stairs with hand rail.  Exercises - walking forward/ backward   - Side Stepping with or without Hand floats  - Forward March with Same Side/Opposite Arm Knee Taps to Hand Floats   - Pool noodle/  hand float pull down to front of thighs   - Staggered Stance Row with Kick Board   - Tandem Stance with Head Rotation  - Hip Hinge with Kick board   - Single Leg Hip Hinge holding noodle   - Seated on noodle, bicycle legs - 3 Way Leg Stretch with Ankle on Pool Noodle  - Side Stepping with Resistance at Ankles and Counter Support   - Forward and Backward Monster Walk with Resistance at Ankles and Counter Support   - Wall Squat with Leg Lifts   - Single Leg Stance    [facility] Adult PT Treatment:                                                DATE: 06/25/23 Neuromuscular re-ed: PPT + wall slide x10 PPT + wall squat + marching 2x10 Standing slow march 10x3" working on pelvic stability Standing SLS 3x15" Therapeutic Activity: Nustep  L5 x 6 min UEs/LEs Side step red TB x5 laps by counter Fwd/bwd monster walk red TB x5 laps by counter Forward march + alternating arms red TB x10; however, started to irritate L SI Self Care: HEP updates   [facility] Adult PT Treatment:  DATE: 06/23/23 Pt seen for aquatic therapy today.  Treatment took place in water 3.5-4.75 ft in depth at the [facility] pool. Temp of water was 91.  Pt entered/exited the pool via stairs with hand rail.   -farmers carry with yellow hand floats bilaterally then unilaterally forward and back - Side Stepping with rainbow Hand floats   - Forward March with Same Side/Opposite Arm Knee Taps to rainbow Hand Floats   - Pool noodle(long hollow) /  rainbow and yellow hand float pull down to front of thighs -> press down on yellow noodle - Staggered Stance Row with [employer], cues for increased speed - tandem stance with horiz head turns, 4 ft and 84ft6"; SLS in 43ft6" (see above) - Hip Hinge with yellow noodle x 10  - Single Leg superman x 10 each LE (demo and cues for form) - 3 way LE stretch with hollow noodle (cramp in LE)-> hamstring stretch with foot on 2nd/3rd step 15s each LE; adductor stretch x 15s each LE  4/17  There-ex  Nu-step 5 min L5  Reviewed and updated HEP  Reviewed the anatomy of stenosis an transverse abdominal muscles    Nuero re-ed     [facility] Adult PT Treatment:                                                DATE: 06/15/23 Pt seen for aquatic therapy today.  Treatment took place in water 3.5-4.75 ft in depth at the [facility] pool. Temp of water was 91.  Pt entered/exited the pool via stairs with hand rail.  Exercises - walking forward/ backward   -farmers carry bilaterally then unilaterally forward and back - Side Stepping with or without Hand floats   - Forward March with Same Side/Opposite Arm Knee Taps to Hand Floats   - Pool noodle/  hand float pull down to front of  thighs   - Staggered Stance Row with Kick Board - Hip Hinge with Kick board  - Single Leg Hip Hinge holding noodle      PATIENT EDUCATION:  Education details: aquatic therapy progression / modifications Person educated: Patient Education method: Explanation Education comprehension: verbalized understanding  HOME EXERCISE PROGRAM: AQUATIC Access Code: [email or web address] URL: [email or web address] Date: 06/23/2023 Prepared by: [facility] This aquatic home exercise program from [employer] utilizes pictures from land based exercises, but has been adapted prior to lamination and issuance.     [id number]  ASSESSMENT:  CLINICAL IMPRESSION: Pt completes SLS 3.6 ft x 20s R/L meeting STG.  She is directed through entirety of final aquatic HEP to ensure understanding and indep. She requires some VC as well as  written clarifications on program.  She is also instructed on use of aqua jogger belt for future aerobic engagement.  She has reached her max potential in setting and is ready to fully transition onto land based intervention increasing load to more efficiently improve strength.   Initial impression Patient is a 73 y.o. f who was seen today for physical therapy evaluation and treatment for Radiculopathy, lumbar region. She reports dysesthesias posterior calf pain and pain in the sole of her foot with associated significant calf cramping which has subsided since taking gabapentin .  Steroid injections in lumbar spine and r SIJ has significantly decreased LBP but she continues to have episodes of radicular discomfort  in right anteromedial thigh to knee she reports as tightness and tingling.  She is an active senior but has become afraid of falling (has not had any falls) due to an increase in general weakness and reports of general muscle atrophy. She does follow her own exercise program walking on treadmill x 40 mins + 5 days a week but does not do other things  she enjoys doing outside her home due to fear of falling. Objective and functional testing demonstrate LE and core weakness.   She will benefit from skilled PT intervention to improve all areas of deficits and return pt to improved QOL. Will begin in aquatics then transition land based when approp.    OBJECTIVE IMPAIRMENTS: decreased activity tolerance, decreased balance, decreased endurance, decreased mobility, difficulty walking, and decreased strength.     GOALS: Goals reviewed with patient? Yes  SHORT TERM GOALS: Target date: 05/10/23  Pt will tolerate full aquatic sessions consistently without increase in pain and with improving function to demonstrate good toleration and effectiveness of intervention.  Baseline: Goal status: MET - 05/18/23  2. Pt will perform SLS and tandem stance in 3.6 ft unsupported x 20s Baseline: see chart Goal status:  Partially met -06/23/23/ Met 06/30/23  3.Pt will report 50% decrease in fear of falling. Baseline: Goal status: MET- 06/23/23  4.  Pt will improve on 5 X STS test to <or= 10s to demonstrate improving functional lower extremity strength, transitional movements, and balance Baseline: 13.80 at eval;  see above Goal status: Met- 05/18/23   LONG TERM GOALS: Target date: POC  Pt will improve on ODI by 25% to demonstrate improved function ability "to do more". Baseline: 62% Goal status: INITIAL  2.  Pt will report running vacuum cleaner (Complete house work) without limitation to fatigue or pain Baseline:  Goal status: INITIAL  3.  Pt to be able to take trash can to and from curb safely Baseline:  Goal status: MET - 06/23/23  4.  Pt will report decreased waking from cramps at night to 1x week or less Baseline: 3 at eval; varies - 06/23/23 Goal status: IN PROGRESS   5.  Pt will improve strength in all areas listed by at least 10 lbs  to demonstrate improved overall physical function Baseline: see chart Goal status: INITIAL  6.   Pt will  report ability to complete a "full workout" (completing cardio/ lifting)  Baseline:  Goal status: INITIAL  PLAN:  PT FREQUENCY: 2x/week  PT DURATION: 8 weeks  PLANNED INTERVENTIONS: 97164- PT Re-evaluation, 97110-Therapeutic exercises, 97530- Therapeutic activity, 97112- Neuromuscular re-education, 97535- Self Care, 16109- Manual therapy, [id number]- Gait training, [phone]- Orthotic Fit/training, [phone]- Aquatic Therapy, 97014- Electrical stimulation (unattended), [phone]- Ionotophoresis 4mg /ml Dexamethasone , Patient/Family education, Balance training, Stair training, Taping, Dry Needling, Joint mobilization, DME instructions, Cryotherapy, and Moist heat.  PLAN FOR NEXT SESSION: aquatics: general strengthening; balance retraining HEP  Frankie Vung Kush, PT 06/30/23 10:11 AM [facility] [address] [place], [place], [address] Phone: [phone]   Fax:  [phone]

## 2023-07-02 ENCOUNTER — Ambulatory Visit (HOSPITAL_BASED_OUTPATIENT_CLINIC_OR_DEPARTMENT_OTHER): Attending: Anesthesiology | Admitting: Physical Therapy

## 2023-07-02 DIAGNOSIS — H5203 Hypermetropia, bilateral: Secondary | ICD-10-CM | POA: Diagnosis not present

## 2023-07-02 DIAGNOSIS — H2513 Age-related nuclear cataract, bilateral: Secondary | ICD-10-CM | POA: Diagnosis not present

## 2023-07-02 DIAGNOSIS — H25013 Cortical age-related cataract, bilateral: Secondary | ICD-10-CM | POA: Diagnosis not present

## 2023-07-02 DIAGNOSIS — M5459 Other low back pain: Secondary | ICD-10-CM | POA: Diagnosis not present

## 2023-07-02 DIAGNOSIS — R2689 Other abnormalities of gait and mobility: Secondary | ICD-10-CM | POA: Diagnosis not present

## 2023-07-02 DIAGNOSIS — H52223 Regular astigmatism, bilateral: Secondary | ICD-10-CM | POA: Diagnosis not present

## 2023-07-02 DIAGNOSIS — M6281 Muscle weakness (generalized): Secondary | ICD-10-CM | POA: Diagnosis not present

## 2023-07-03 NOTE — Therapy (Signed)
 OUTPATIENT PHYSICAL THERAPY THORACOLUMBAR TREATMENT   Patient Name: Julie Tapia MRN: [id number] DOB:1950/03/19, 73 y.o., female Today's Date: 07/03/2023  END OF SESSION:     Past Medical History:  Diagnosis Date   Aneurysm (HCC) 01-16-11   "lt .side of head not intra(cavity area)/Found during series of tests to r/o TIA's"-report from [facility] showed  Lt. ICA  aneurysm   Arthritis 01-16-11   osteoarthritis,-knees, hips, ankles. 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 tight chested-"no inhalers in yrs.   Complication of anesthesia 01-16-11   Pt. very slow to awaken and was days before feeling like herself.   Past Surgical History:  Procedure Laterality Date   ABDOMINAL HYSTERECTOMY     BREAST CYST EXCISION  01-16-11   several excisions in the past   JOINT REPLACEMENT  01-16-11   Rt. great toe ball joint replacement-metal   REDUCTION MAMMAPLASTY  01-16-11   Bil. '86( problems with s/p surgical clot at rt. breast drainage site)   TOTAL HIP ARTHROPLASTY  01/20/2011   Procedure: TOTAL HIP ARTHROPLASTY ANTERIOR APPROACH;  Surgeon: Bevin Bucks;  Location: [facility];  Service: Orthopedics;  Laterality: Right;   Patient Active Problem List   Diagnosis Date Noted   Myofascial pain syndrome of lumbar spine 05/25/2023   Chronic bilateral low back pain with bilateral sciatica 05/25/2023   Muscle cramps 05/25/2023   Nerve pain due to spinal stenosis 05/25/2023   Benign fasciculations 05/25/2023   Cerebral aneurysm 01/12/2021   PFO (patent foramen ovale) 01/12/2021    PCP: Barnetta Liberty MD  REFERRING PROVIDER:   Michele Ahle, MD    REFERRING DIAG: M54.16 (ICD-10-CM) - Radiculopathy, lumbar region   Rationale for Evaluation and Treatment: Rehabilitation  THERAPY DIAG:  No diagnosis found.  ONSET DATE: last year  SUBJECTIVE:                                                                                                                                                                                            SUBJECTIVE STATEMENT: The patien treports she has been doing well. She is having very little pain. She likes the pool.   Initial Subjective Began/Back injection 2007-8.  Went dormant.  Returned 2020.  Summer 2024 hurt my back lifting case of water.  Have had back pain continual since. Phares Brasher has given injections in lumbar spine and L SIJ. Fasciculation/cramps were bad in my calves.  Better with gabapentin  low dose.  Most nights I can sleep (2-3 x week not sleeping). I have atrophied a lot.  Have  a treadmill that I walk on it 5/7 days a week 6000-10000 step.  I immediately lay on mat doing exercises I was shown form other PT in past after finishing on bike. May need to have my left hip replaced pain and stiffness there.  I feel stiff in my back and hip. Weakness has been significantly decreasing since the summer. Tested for ALS neg  PERTINENT HISTORY:  R THR  PAIN:  Are you having pain? no: NPRS scale: current 0/10 Pain location:  Pain description:  Aggravating factors: walking on treadmill Relieving factors: rest/ stretching, meds  PRECAUTIONS: None  RED FLAGS: None   WEIGHT BEARING RESTRICTIONS: No  FALLS:  Has patient fallen in last 6 months? No  LIVING ENVIRONMENT: Lives with: lives alone Lives in: House/apartment Stairs: Yes: External: 8 steps; on right going up Has following equipment at home: None  OCCUPATION: retired last year;  PLOF: Independent  PATIENT GOALS: Increase strength, improve overall health, balance to do more  NEXT MD VISIT: end of March  OBJECTIVE:  Note: Objective measures were completed at Evaluation unless otherwise noted.  DIAGNOSTIC FINDINGS:  MRI lumbar spine dated October 08, 2018 shows type II lumbosacral transitional vertebrae. At L2-3 there is moderate left and mild to moderate right neural foraminal narrowing with contact of the  exiting bilateral L2 nerve roots worse on the left than the right. There is moderate left subarticular zone narrowing with mild spinal canal stenosis impinging upon with mild to moderate displacement of the left L3 nerve root due to a very small central disc extrusion superimposed upon a severe bulging disc osteophyte complex asymmetric to the left. At L3-4 there is a disc extrusion superimposed upon a severe left lateral bulging disc osteophyte and severe left disc height loss resulting in moderate to severe left foraminal narrowing and impingement upon the exiting left L3 nerve root. Moderate left and mild right subarticular zone narrowing and mild spinal canal stenosis with impingement upon the descending left L4 nerve root. At L4-5 there is moderate to severe left and moderate right facet arthrosis. Mild dextroconvex lumbar scoliosis with apex at L3.   PATIENT SURVEYS:  ODI  14(2)=28/45= 62%  COGNITION: Overall cognitive status: Within functional limits for tasks assessed     SENSATION: dysesthesias posterior calf pain and pain in the sole of her foot  MUSCLE LENGTH: Hamstrings: tight   POSTURE: rounded shoulders and increased thoracic kyphosis   LUMBAR ROM:  wfl  LOWER EXTREMITY ROM:   wfl  LOWER EXTREMITY MMT:    MMT Right eval Left eval  Hip flexion 39.5 29.8  Hip extension    Hip abduction 13.9 18.0  Hip adduction    Hip internal rotation    Hip external rotation    Knee flexion    Knee extension 26.4 22.6  Ankle dorsiflexion    Ankle plantarflexion    Ankle inversion    Ankle eversion     (Blank rows = not tested)  LUMBAR SPECIAL TESTS:  Straight leg raise test: Negative and Slump test: Negative  FUNCTIONAL TESTS:  5 times sit to stand: 13.80 Timed up and go (TUG): 10.79 4 stage: passed 1&2.  Tandem x 5s; SLS x 7 05/18/23:  5x STS:  10.5s 06/23/23: in 56ft6" water: tandem stance 20sec each LE;  SLS -R 20s, L up to 14s (challenge)  GAIT: Distance walked:  unlimited distance Assistive device utilized: None Level of assistance: Complete Independence Comments: wfl  TREATMENT  5/3:   There-ex:   Reviewed set up of the exercise bike. She had minor pain at first but it improved as she exercised  Chest press 5 lbs x10 RPE of 4 6 lbs 2x10 RPE of 5  Neuro-re-ed:  Punch with cuing for posture and breathing 3 lbs RPE of 6 3x10  Curl with posture 4,5,6 pounds x10 each with RPE progressing to 6  Tricps press down 25 lbs 3x12 RPE of 5   Attempted hip abduction and abduction but patient unable to perform 2nd to pain      [facility] Adult PT Treatment:                                                DATE: 06/30/23 Pt seen for aquatic therapy today.  Treatment took place in water 3.5-4.75 ft in depth at the [facility] pool. Temp of water was 91.  Pt entered/exited the pool via stairs with hand rail.  Exercises - walking forward/ backward   - Side Stepping with or without Hand floats  - Forward March with Same Side/Opposite Arm Knee Taps to Hand Floats   - Pool noodle/  hand float pull down to front of thighs   - Staggered Stance Row with Kick Board   - Tandem Stance with Head Rotation  - Hip Hinge with Kick board   - Single Leg Hip Hinge holding noodle   - Seated on noodle, bicycle legs - 3 Way Leg Stretch with Ankle on Pool Noodle  - Side Stepping with Resistance at Ankles and Counter Support   - Forward and Backward Monster Walk with Resistance at Ankles and Counter Support   - Wall Squat with Leg Lifts   - Single Leg Stance    [facility] Adult PT Treatment:                                                DATE: 06/25/23 Neuromuscular re-ed: PPT + wall slide x10 PPT + wall squat + marching 2x10 Standing slow march 10x3" working on pelvic stability Standing SLS 3x15" Therapeutic Activity: Nustep L5 x 6 min UEs/LEs Side  step red TB x5 laps by counter Fwd/bwd monster walk red TB x5 laps by counter Forward march + alternating arms red TB x10; however, started to irritate L SI Self Care: HEP updates   [facility] Adult PT Treatment:                                                DATE: 06/23/23 Pt seen for aquatic therapy today.  Treatment took place in water 3.5-4.75 ft in depth at the [facility] pool. Temp of water was 91.  Pt entered/exited the pool via stairs with hand rail.   -farmers carry with yellow hand floats bilaterally then unilaterally forward and back - Side Stepping with rainbow Hand floats   - Forward March with Same Side/Opposite Arm Knee Taps to rainbow Hand Floats   - Pool noodle(long hollow) /  rainbow and yellow hand float pull down to front of thighs -> press down on yellow noodle -  Staggered Stance Row with [employer], cues for increased speed - tandem stance with horiz head turns, 4 ft and 91ft6"; SLS in 15ft6" (see above) - Hip Hinge with yellow noodle x 10  - Single Leg superman x 10 each LE (demo and cues for form) - 3 way LE stretch with hollow noodle (cramp in LE)-> hamstring stretch with foot on 2nd/3rd step 15s each LE; adductor stretch x 15s each LE  4/17  There-ex  Nu-step 5 min L5  Reviewed and updated HEP  Reviewed the anatomy of stenosis an transverse abdominal muscles    Nuero re-ed     [facility] Adult PT Treatment:                                                DATE: 06/15/23 Pt seen for aquatic therapy today.  Treatment took place in water 3.5-4.75 ft in depth at the [facility] pool. Temp of water was 91.  Pt entered/exited the pool via stairs with hand rail.  Exercises - walking forward/ backward   -farmers carry bilaterally then unilaterally forward and back - Side Stepping with or without Hand floats   - Forward March with Same Side/Opposite Arm Knee Taps to Hand Floats   - Pool noodle/  hand float pull down to front of thighs   - Staggered  Stance Row with Kick Board - Hip Hinge with Kick board  - Single Leg Hip Hinge holding noodle      PATIENT EDUCATION:  Education details: aquatic therapy progression / modifications Person educated: Patient Education method: Explanation Education comprehension: verbalized understanding  HOME EXERCISE PROGRAM: AQUATIC Access Code: [email or web address] URL: [email or web address] Date: 06/23/2023 Prepared by: [facility] [address] This aquatic home exercise program from [employer] utilizes pictures from land based exercises, but has been adapted prior to lamination and issuance.     [id number]  ASSESSMENT:  CLINICAL IMPRESSION: Therapy continues to expand the patients gym program. She expressed interest in strengthening her upper body and chest. We reviewed how all these exercises can be tied into the core. We rated her weights with the dumbbells. She did not like the hip abduction or adduction machines. Therapy will continue to expand her HEP as tolerated.   Initial impression Patient is a 73 y.o. f who was seen today for physical therapy evaluation and treatment for Radiculopathy, lumbar region. She reports dysesthesias posterior calf pain and pain in the sole of her foot with associated significant calf cramping which has subsided since taking gabapentin .  Steroid injections in lumbar spine and r SIJ has significantly decreased LBP but she continues to have episodes of radicular discomfort in right anteromedial thigh to knee she reports as tightness and tingling.  She is an active senior but has become afraid of falling (has not had any falls) due to an increase in general weakness and reports of general muscle atrophy. She does follow her own exercise program walking on treadmill x 40 mins + 5 days a week but does not do other things she enjoys doing outside her home due to fear of falling. Objective and functional testing demonstrate LE and core weakness.   She will  benefit from skilled PT intervention to improve all areas of deficits and return pt to improved QOL. Will begin in aquatics then transition land based when approp.    OBJECTIVE  IMPAIRMENTS: decreased activity tolerance, decreased balance, decreased endurance, decreased mobility, difficulty walking, and decreased strength.     GOALS: Goals reviewed with patient? Yes  SHORT TERM GOALS: Target date: 05/10/23  Pt will tolerate full aquatic sessions consistently without increase in pain and with improving function to demonstrate good toleration and effectiveness of intervention.  Baseline: Goal status: MET - 05/18/23  2. Pt will perform SLS and tandem stance in 3.6 ft unsupported x 20s Baseline: see chart Goal status:  Partially met -06/23/23/ Met 06/30/23  3.Pt will report 50% decrease in fear of falling. Baseline: Goal status: MET- 06/23/23  4.  Pt will improve on 5 X STS test to <or= 10s to demonstrate improving functional lower extremity strength, transitional movements, and balance Baseline: 13.80 at eval;  see above Goal status: Met- 05/18/23   LONG TERM GOALS: Target date: POC  Pt will improve on ODI by 25% to demonstrate improved function ability "to do more". Baseline: 62% Goal status: INITIAL  2.  Pt will report running vacuum cleaner (Complete house work) without limitation to fatigue or pain Baseline:  Goal status: INITIAL  3.  Pt to be able to take trash can to and from curb safely Baseline:  Goal status: MET - 06/23/23  4.  Pt will report decreased waking from cramps at night to 1x week or less Baseline: 3 at eval; varies - 06/23/23 Goal status: IN PROGRESS   5.  Pt will improve strength in all areas listed by at least 10 lbs  to demonstrate improved overall physical function Baseline: see chart Goal status: INITIAL  6.   Pt will report ability to complete a "full workout" (completing cardio/ lifting)  Baseline:  Goal status: INITIAL  PLAN:  PT FREQUENCY:  2x/week  PT DURATION: 8 weeks  PLANNED INTERVENTIONS: 97164- PT Re-evaluation, 97110-Therapeutic exercises, 97530- Therapeutic activity, 97112- Neuromuscular re-education, 97535- Self Care, [id number]- Manual therapy, [id number]- Gait training, [phone]- Orthotic Fit/training, [phone]- Aquatic Therapy, 97014- Electrical stimulation (unattended), [phone]- Ionotophoresis 4mg /ml Dexamethasone , Patient/Family education, Balance training, Stair training, Taping, Dry Needling, Joint mobilization, DME instructions, Cryotherapy, and Moist heat.  PLAN FOR NEXT SESSION: aquatics: general strengthening; balance retraining HEP  Kitty Perkins, PT 07/03/23 7:37 PM [facility] [address] [place], [place], [address] Phone: [phone]   Fax:  [phone]

## 2023-07-06 ENCOUNTER — Encounter: Payer: Self-pay | Admitting: Neurology

## 2023-07-06 ENCOUNTER — Ambulatory Visit (HOSPITAL_BASED_OUTPATIENT_CLINIC_OR_DEPARTMENT_OTHER)

## 2023-07-06 DIAGNOSIS — R2689 Other abnormalities of gait and mobility: Secondary | ICD-10-CM

## 2023-07-06 DIAGNOSIS — M5459 Other low back pain: Secondary | ICD-10-CM | POA: Diagnosis not present

## 2023-07-06 DIAGNOSIS — M6281 Muscle weakness (generalized): Secondary | ICD-10-CM

## 2023-07-06 NOTE — Therapy (Signed)
 OUTPATIENT PHYSICAL THERAPY THORACOLUMBAR TREATMENT   Patient Name: Julie Tapia MRN: [id number] DOB:1950/12/03, 73 y.o., female Today's Date: 07/06/2023  END OF SESSION:  PT End of Session - 07/06/23 1152     Visit Number 19    Date for PT Re-Evaluation 07/30/23    Authorization Type mcr    PT Start Time 1149    PT Stop Time 1230    PT Time Calculation (min) 41 min    Activity Tolerance Patient tolerated treatment well    Behavior During Therapy [facility] for tasks assessed/performed               Past Medical History:  Diagnosis Date   Aneurysm (HCC) 01-16-11   "lt .side of head not intra(cavity area)/Found during series of tests to r/o TIA's"-report from [facility] showed  Lt. ICA  aneurysm   Arthritis 01-16-11   osteoarthritis,-knees, hips, ankles. 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 tight chested-"no inhalers in yrs.   Complication of anesthesia 01-16-11   Pt. very slow to awaken and was days before feeling like herself.   Past Surgical History:  Procedure Laterality Date   ABDOMINAL HYSTERECTOMY     BREAST CYST EXCISION  01-16-11   several excisions in the past   JOINT REPLACEMENT  01-16-11   Rt. great toe ball joint replacement-metal   REDUCTION MAMMAPLASTY  01-16-11   Bil. '86( problems with s/p surgical clot at rt. breast drainage site)   TOTAL HIP ARTHROPLASTY  01/20/2011   Procedure: TOTAL HIP ARTHROPLASTY ANTERIOR APPROACH;  Surgeon: Bevin Bucks;  Location: [facility];  Service: Orthopedics;  Laterality: Right;   Patient Active Problem List   Diagnosis Date Noted   Myofascial pain syndrome of lumbar spine 05/25/2023   Chronic bilateral low back pain with bilateral sciatica 05/25/2023   Muscle cramps 05/25/2023   Nerve pain due to spinal stenosis 05/25/2023   Benign fasciculations 05/25/2023   Cerebral aneurysm 01/12/2021   PFO (patent foramen ovale) 01/12/2021    PCP: Barnetta Liberty MD  REFERRING PROVIDER:   Michele Ahle, MD    REFERRING DIAG: M54.16 (ICD-10-CM) - Radiculopathy, lumbar region   Rationale for Evaluation and Treatment: Rehabilitation  THERAPY DIAG:  Muscle weakness (generalized)  Other abnormalities of gait and mobility  Other low back pain  ONSET DATE: last year  SUBJECTIVE:  SUBJECTIVE STATEMENT: The patien treports she has been doing well. She is having very little pain. She likes the pool.   Initial Subjective Began/Back injection 2007-8.  Went dormant.  Returned 2020.  Summer 2024 hurt my back lifting case of water.  Have had back pain continual since. Julie Tapia has given injections in lumbar spine and L SIJ. Fasciculation/cramps were bad in my calves.  Better with gabapentin  low dose.  Most nights I can sleep (2-3 x week not sleeping). I have atrophied a lot.  Have a treadmill that I walk on it 5/7 days a week 6000-10000 step.  I immediately lay on mat doing exercises I was shown form other PT in past after finishing on bike. May need to have my left hip replaced pain and stiffness there.  I feel stiff in my back and hip. Weakness has been significantly decreasing since the summer. Tested for ALS neg  PERTINENT HISTORY:  R THR  PAIN:  Are you having pain? no: NPRS scale: current 0/10 Pain location:  Pain description:  Aggravating factors: walking on treadmill Relieving factors: rest/ stretching, meds  PRECAUTIONS: None  RED FLAGS: None   WEIGHT BEARING RESTRICTIONS: No  FALLS:  Has patient fallen in last 6 months? No  LIVING ENVIRONMENT: Lives with: lives alone Lives in: House/apartment Stairs: Yes: External: 8 steps; on right going up Has following equipment at home: None  OCCUPATION: retired last year;  PLOF:  Independent  PATIENT GOALS: Increase strength, improve overall health, balance to do more  NEXT MD VISIT: end of March  OBJECTIVE:  Note: Objective measures were completed at Evaluation unless otherwise noted.  DIAGNOSTIC FINDINGS:  MRI lumbar spine dated October 08, 2018 shows type II lumbosacral transitional vertebrae. At L2-3 there is moderate left and mild to moderate right neural foraminal narrowing with contact of the exiting bilateral L2 nerve roots worse on the left than the right. There is moderate left subarticular zone narrowing with mild spinal canal stenosis impinging upon with mild to moderate displacement of the left L3 nerve root due to a very small central disc extrusion superimposed upon a severe bulging disc osteophyte complex asymmetric to the left. At L3-4 there is a disc extrusion superimposed upon a severe left lateral bulging disc osteophyte and severe left disc height loss resulting in moderate to severe left foraminal narrowing and impingement upon the exiting left L3 nerve root. Moderate left and mild right subarticular zone narrowing and mild spinal canal stenosis with impingement upon the descending left L4 nerve root. At L4-5 there is moderate to severe left and moderate right facet arthrosis. Mild dextroconvex lumbar scoliosis with apex at L3.   PATIENT SURVEYS:  ODI  14(2)=28/45= 62%  COGNITION: Overall cognitive status: Within functional limits for tasks assessed     SENSATION: dysesthesias posterior calf pain and pain in the sole of her foot  MUSCLE LENGTH: Hamstrings: tight   POSTURE: rounded shoulders and increased thoracic kyphosis   LUMBAR ROM:  wfl  LOWER EXTREMITY ROM:   wfl  LOWER EXTREMITY MMT:    MMT Right eval Left eval  Hip flexion 39.5 29.8  Hip extension    Hip abduction 13.9 18.0  Hip adduction    Hip internal rotation    Hip external rotation    Knee flexion    Knee extension 26.4 22.6  Ankle dorsiflexion    Ankle  plantarflexion    Ankle inversion    Ankle eversion     (Blank rows = not tested)  LUMBAR SPECIAL TESTS:  Straight leg raise test: Negative and Slump test: Negative  FUNCTIONAL TESTS:  5 times sit to stand: 13.80 Timed up and go (TUG): 10.79 4 stage: passed 1&2.  Tandem x 5s; SLS x 7 05/18/23:  5x STS:  10.5s 06/23/23: in 46ft6" water: tandem stance 20sec each LE;  SLS -R 20s, L up to 14s (challenge)  GAIT: Distance walked: unlimited distance Assistive device utilized: None Level of assistance: Complete Independence Comments: wfl  TREATMENT                                                                                                                           5/6:  There-ex:   Thomas stretch 30sec x2ea -Bridge with ball squeeze 2x10  Neuro re-ed -hooklying hip abduction isometric with gait belt 5" 2x10 -supine marching with TA- also from 90/90 2x20 Quadruped alt UE extension Quadruped alt LE ext x10 (with cues for pelvic alignment) Review/update of HEP     Manual: STM and TPR to L gluteal mm   5/3:   There-ex:   Reviewed set up of the exercise bike. She had minor pain at first but it improved as she exercised  Chest press 5 lbs x10 RPE of 4 6 lbs 2x10 RPE of 5  Neuro-re-ed:  Punch with cuing for posture and breathing 3 lbs RPE of 6 3x10  Curl with posture 4,5,6 pounds x10 each with RPE progressing to 6  Tricps press down 25 lbs 3x12 RPE of 5   Attempted hip abduction and abduction but patient unable to perform 2nd to pain      [facility] Adult PT Treatment:                                                DATE: 06/30/23 Pt seen for aquatic therapy today.  Treatment took place in water 3.5-4.75 ft in depth at the [facility] pool. Temp of water was 91.  Pt entered/exited the pool via stairs with hand rail.  Exercises - walking forward/ backward   - Side Stepping with or without Hand floats  - Forward March with Same Side/Opposite Arm Knee Taps to Hand  Floats   - Pool noodle/  hand float pull down to front of thighs   - Staggered Stance Row with Kick Board   - Tandem Stance with Head Rotation  - Hip Hinge with Kick board   - Single Leg Hip Hinge holding noodle   - Seated on noodle, bicycle legs - 3 Way Leg Stretch with Ankle on Pool Noodle  - Side Stepping with Resistance at Ankles and Counter Support   - Forward and Backward Monster Walk with Resistance at Ankles and Counter Support   - Wall Squat with Leg Lifts   - Single Leg Stance    [facility] Adult PT Treatment:  DATE: 06/25/23 Neuromuscular re-ed: PPT + wall slide x10 PPT + wall squat + marching 2x10 Standing slow march 10x3" working on pelvic stability Standing SLS 3x15" Therapeutic Activity: Nustep L5 x 6 min UEs/LEs Side step red TB x5 laps by counter Fwd/bwd monster walk red TB x5 laps by counter Forward march + alternating arms red TB x10; however, started to irritate L SI Self Care: HEP updates   [facility] Adult PT Treatment:                                                DATE: 06/23/23 Pt seen for aquatic therapy today.  Treatment took place in water 3.5-4.75 ft in depth at the [facility] pool. Temp of water was 91.  Pt entered/exited the pool via stairs with hand rail.   -farmers carry with yellow hand floats bilaterally then unilaterally forward and back - Side Stepping with rainbow Hand floats   - Forward March with Same Side/Opposite Arm Knee Taps to rainbow Hand Floats   - Pool noodle(long hollow) /  rainbow and yellow hand float pull down to front of thighs -> press down on yellow noodle - Staggered Stance Row with [employer], cues for increased speed - tandem stance with horiz head turns, 4 ft and 45ft6"; SLS in 68ft6" (see above) - Hip Hinge with yellow noodle x 10  - Single Leg superman x 10 each LE (demo and cues for form) - 3 way LE stretch with hollow noodle (cramp in LE)-> hamstring stretch with foot on  2nd/3rd step 15s each LE; adductor stretch x 15s each LE  4/17  There-ex  Nu-step 5 min L5  Reviewed and updated HEP  Reviewed the anatomy of stenosis an transverse abdominal muscles    Nuero re-ed     [facility] Adult PT Treatment:                                                DATE: 06/15/23 Pt seen for aquatic therapy today.  Treatment took place in water 3.5-4.75 ft in depth at the [facility] pool. Temp of water was 91.  Pt entered/exited the pool via stairs with hand rail.  Exercises - walking forward/ backward   -farmers carry bilaterally then unilaterally forward and back - Side Stepping with or without Hand floats   - Forward March with Same Side/Opposite Arm Knee Taps to Hand Floats   - Pool noodle/  hand float pull down to front of thighs   - Staggered Stance Row with Kick Board - Hip Hinge with Kick board  - Single Leg Hip Hinge holding noodle      PATIENT EDUCATION:  Education details: aquatic therapy progression / modifications Person educated: Patient Education method: Explanation Education comprehension: verbalized understanding  HOME EXERCISE PROGRAM: AQUATIC Access Code: [email or web address] URL: [email or web address] Date: 06/23/2023 Prepared by: Julie Tapia This aquatic home exercise program from MedBridge utilizes pictures from land based exercises, but has been adapted prior to lamination and issuance.     [id number]  ASSESSMENT:  CLINICAL IMPRESSION: Good tolerance for stabilization interventions. Some cues required with quadruped alternating LE extension to decrease pelvic drop. Monitored L hip pain throughout session. With palpation and  STM, pt very tender throughout L glutes and piriformis. Spent time on decreasing this. Educated on tennis ball use for self IASTM at home. Will continue to progress as tolerated.   Initial impression Patient is a 73 y.o. f who was seen today for physical therapy evaluation  and treatment for Radiculopathy, lumbar region. She reports dysesthesias posterior calf pain and pain in the sole of her foot with associated significant calf cramping which has subsided since taking gabapentin .  Steroid injections in lumbar spine and r SIJ has significantly decreased LBP but she continues to have episodes of radicular discomfort in right anteromedial thigh to knee she reports as tightness and tingling.  She is an active senior but has become afraid of falling (has not had any falls) due to an increase in general weakness and reports of general muscle atrophy. She does follow her own exercise program walking on treadmill x 40 mins + 5 days a week but does not do other things she enjoys doing outside her home due to fear of falling. Objective and functional testing demonstrate LE and core weakness.   She will benefit from skilled PT intervention to improve all areas of deficits and return pt to improved QOL. Will begin in aquatics then transition land based when approp.    OBJECTIVE IMPAIRMENTS: decreased activity tolerance, decreased balance, decreased endurance, decreased mobility, difficulty walking, and decreased strength.     GOALS: Goals reviewed with patient? Yes  SHORT TERM GOALS: Target date: 05/10/23  Pt will tolerate full aquatic sessions consistently without increase in pain and with improving function to demonstrate good toleration and effectiveness of intervention.  Baseline: Goal status: MET - 05/18/23  2. Pt will perform SLS and tandem stance in 3.6 ft unsupported x 20s Baseline: see chart Goal status:  Partially met -06/23/23/ Met 06/30/23  3.Pt will report 50% decrease in fear of falling. Baseline: Goal status: MET- 06/23/23  4.  Pt will improve on 5 X STS test to <or= 10s to demonstrate improving functional lower extremity strength, transitional movements, and balance Baseline: 13.80 at eval;  see above Goal status: Met- 05/18/23   LONG TERM GOALS: Target  date: POC  Pt will improve on ODI by 25% to demonstrate improved function ability "to do more". Baseline: 62% Goal status: INITIAL  2.  Pt will report running vacuum cleaner (Complete house work) without limitation to fatigue or pain Baseline:  Goal status: IN PROGRESS 5/6  3.  Pt to be able to take trash can to and from curb safely Baseline:  Goal status: MET - 06/23/23  4.  Pt will report decreased waking from cramps at night to 1x week or less Baseline: 3 at eval; varies - 06/23/23 Goal status: IN PROGRESS   5.  Pt will improve strength in all areas listed by at least 10 lbs  to demonstrate improved overall physical function Baseline: see chart Goal status: INITIAL  6.   Pt will report ability to complete a "full workout" (completing cardio/ lifting)  Baseline:  Goal status: INITIAL  PLAN:  PT FREQUENCY: 2x/week  PT DURATION: 8 weeks  PLANNED INTERVENTIONS: 97164- PT Re-evaluation, 97110-Therapeutic exercises, 97530- Therapeutic activity, 97112- Neuromuscular re-education, 97535- Self Care, [address]109- Manual therapy, [id number]- Gait training, [phone]- Orthotic Fit/training, [phone]- Aquatic Therapy, 97014- Electrical stimulation (unattended), [phone]- Ionotophoresis 4mg /ml Dexamethasone , Patient/Family education, Balance training, Stair training, Taping, Dry Needling, Joint mobilization, DME instructions, Cryotherapy, and Moist heat.  PLAN FOR NEXT SESSION: aquatics: general strengthening; balance retraining HEP  Julie Tapia  Julie Tapia, PTA 07/06/23 1:45 PM [facility] [address] [place], [place], [address] Phone: [phone]   Fax:  [phone]

## 2023-07-08 ENCOUNTER — Ambulatory Visit (HOSPITAL_BASED_OUTPATIENT_CLINIC_OR_DEPARTMENT_OTHER): Admitting: Physical Therapy

## 2023-07-08 ENCOUNTER — Encounter (HOSPITAL_BASED_OUTPATIENT_CLINIC_OR_DEPARTMENT_OTHER): Payer: Self-pay | Admitting: Physical Therapy

## 2023-07-08 DIAGNOSIS — M5459 Other low back pain: Secondary | ICD-10-CM

## 2023-07-08 DIAGNOSIS — R2689 Other abnormalities of gait and mobility: Secondary | ICD-10-CM

## 2023-07-08 DIAGNOSIS — M6281 Muscle weakness (generalized): Secondary | ICD-10-CM | POA: Diagnosis not present

## 2023-07-08 NOTE — Therapy (Signed)
 OUTPATIENT PHYSICAL THERAPY THORACOLUMBAR TREATMENT/progress note    Patient Name: Julie Tapia MRN: [id number] DOB:05/28/1950, 73 y.o., female Today's Date: 07/09/2023  END OF SESSION:  PT End of Session - 07/08/23 0846     Visit Number 20    Date for PT Re-Evaluation 07/30/23    Authorization Type mcr    PT Start Time 1017    PT Stop Time 1059    PT Time Calculation (min) 42 min    Activity Tolerance Patient tolerated treatment well;No increased pain    Behavior During Therapy [facility] for tasks assessed/performed                Past Medical History:  Diagnosis Date   Aneurysm (HCC) 01-16-11   "lt .side of head not intra(cavity area)/Found during series of tests to r/o TIA's"-report from [facility] showed  Lt. ICA  aneurysm   Arthritis 01-16-11   osteoarthritis,-knees, hips, ankles. 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 tight chested-"no inhalers in yrs.   Complication of anesthesia 01-16-11   Pt. very slow to awaken and was days before feeling like herself.   Past Surgical History:  Procedure Laterality Date   ABDOMINAL HYSTERECTOMY     BREAST CYST EXCISION  01-16-11   several excisions in the past   JOINT REPLACEMENT  01-16-11   Rt. great toe ball joint replacement-metal   REDUCTION MAMMAPLASTY  01-16-11   Bil. '86( problems with s/p surgical clot at rt. breast drainage site)   TOTAL HIP ARTHROPLASTY  01/20/2011   Procedure: TOTAL HIP ARTHROPLASTY ANTERIOR APPROACH;  Surgeon: Bevin Bucks;  Location: [facility];  Service: Orthopedics;  Laterality: Right;   Patient Active Problem List   Diagnosis Date Noted   Myofascial pain syndrome of lumbar spine 05/25/2023   Chronic bilateral low back pain with bilateral sciatica 05/25/2023   Muscle cramps 05/25/2023   Nerve pain due to spinal stenosis 05/25/2023   Benign fasciculations 05/25/2023   Cerebral aneurysm 01/12/2021   PFO (patent foramen ovale)  01/12/2021  Progress Note/re-cert Reporting Period 06/02/23 to 07/09/2023  PCP: Barnetta Liberty MD  REFERRING PROVIDER:   Michele Ahle, MD    REFERRING DIAG: M54.16 (ICD-10-CM) - Radiculopathy, lumbar region   Rationale for Evaluation and Treatment: Rehabilitation  THERAPY DIAG:  Muscle weakness (generalized)  Other abnormalities of gait and mobility  Other low back pain  ONSET DATE: last year  SUBJECTIVE:  SUBJECTIVE STATEMENT: 5/8 Pt wants to do more floor activities and stuff with free weights. Does not mind gym machines but wants to find stuff that won't aggravate the hip.   Initial Subjective Began/Back injection 2007-8.  Went dormant.  Returned 2020.  Summer 2024 hurt my back lifting case of water.  Have had back pain continual since. Phares Brasher has given injections in lumbar spine and L SIJ. Fasciculation/cramps were bad in my calves.  Better with gabapentin  low dose.  Most nights I can sleep (2-3 x week not sleeping). I have atrophied a lot.  Have a treadmill that I walk on it 5/7 days a week 6000-10000 step.  I immediately lay on mat doing exercises I was shown form other PT in past after finishing on bike. May need to have my left hip replaced pain and stiffness there.  I feel stiff in my back and hip. Weakness has been significantly decreasing since the summer. Tested for ALS neg  PERTINENT HISTORY:  R THR  PAIN:  Are you having pain? no: NPRS scale: current 0/10 Pain location:  Pain description:  Aggravating factors: walking on treadmill Relieving factors: rest/ stretching, meds  PRECAUTIONS: None  RED FLAGS: None   WEIGHT BEARING RESTRICTIONS: No  FALLS:  Has patient fallen in last 6 months? No  LIVING ENVIRONMENT: Lives with: lives alone Lives in: House/apartment Stairs:  Yes: External: 8 steps; on right going up Has following equipment at home: None  OCCUPATION: retired last year;  PLOF: Independent  PATIENT GOALS: Increase strength, improve overall health, balance to do more  NEXT MD VISIT: end of March  OBJECTIVE:  Note: Objective measures were completed at Evaluation unless otherwise noted.  DIAGNOSTIC FINDINGS:  MRI lumbar spine dated October 08, 2018 shows type II lumbosacral transitional vertebrae. At L2-3 there is moderate left and mild to moderate right neural foraminal narrowing with contact of the exiting bilateral L2 nerve roots worse on the left than the right. There is moderate left subarticular zone narrowing with mild spinal canal stenosis impinging upon with mild to moderate displacement of the left L3 nerve root due to a very small central disc extrusion superimposed upon a severe bulging disc osteophyte complex asymmetric to the left. At L3-4 there is a disc extrusion superimposed upon a severe left lateral bulging disc osteophyte and severe left disc height loss resulting in moderate to severe left foraminal narrowing and impingement upon the exiting left L3 nerve root. Moderate left and mild right subarticular zone narrowing and mild spinal canal stenosis with impingement upon the descending left L4 nerve root. At L4-5 there is moderate to severe left and moderate right facet arthrosis. Mild dextroconvex lumbar scoliosis with apex at L3.   PATIENT SURVEYS:  ODI 14(2)=28/45= 62%  COGNITION: Overall cognitive status: Within functional limits for tasks assessed     SENSATION: dysesthesias posterior calf pain and pain in the sole of her foot  MUSCLE LENGTH: Hamstrings: tight   POSTURE: rounded shoulders and increased thoracic kyphosis   LUMBAR ROM:  wfl  LOWER EXTREMITY ROM:   wfl  LOWER EXTREMITY MMT:    MMT Right eval Left eval  Hip flexion 39.5 29.8  Hip extension    Hip abduction 13.9 18.0  Hip adduction    Hip  internal rotation    Hip external rotation    Knee flexion    Knee extension 26.4 22.6  Ankle dorsiflexion    Ankle plantarflexion    Ankle inversion    Ankle eversion     (  Blank rows = not tested)  LUMBAR SPECIAL TESTS:  Straight leg raise test: Negative and Slump test: Negative  FUNCTIONAL TESTS:  5 times sit to stand: 13.80 Timed up and go (TUG): 10.79 4 stage: passed 1&2.  Tandem x 5s; SLS x 7 05/18/23:  5x STS:  10.5s 06/23/23: in 53ft6" water: tandem stance 20sec each LE;  SLS -R 20s, L up to 14s (challenge)  GAIT: Distance walked: unlimited distance Assistive device utilized: None Level of assistance: Complete Independence Comments: wfl  5/9: walking without pain   5/9 full ROM of the back and hip wthout pain   5/9: improved ability to perfrom gym activity and exercises.   TREATMENT                                                                                                                          5/8  There-ex: Incline db press 5lbs 3x10 Step up hip flex hold 2x10 KB DL 1O10 TRX rows 9U04 TRX squats 2x10 Leg press 70lbs 2x10    5/6:  There-ex:   Thomas stretch 30sec x2ea -Bridge with ball squeeze 2x10  Neuro re-ed -hooklying hip abduction isometric with gait belt 5" 2x10 -supine marching with TA- also from 90/90 2x20 Quadruped alt UE extension Quadruped alt LE ext x10 (with cues for pelvic alignment) Review/update of HEP     Manual: STM and TPR to L gluteal mm   5/3:   There-ex:   Reviewed set up of the exercise bike. She had minor pain at first but it improved as she exercised  Chest press 5 lbs x10 RPE of 4 6 lbs 2x10 RPE of 5  Neuro-re-ed:  Punch with cuing for posture and breathing 3 lbs RPE of 6 3x10  Curl with posture 4,5,6 pounds x10 each with RPE progressing to 6  Tricps press down 25 lbs 3x12 RPE of 5   Attempted hip abduction and abduction but patient unable to perform 2nd to pain      [facility] Adult PT Treatment:                                                 DATE: 06/30/23 Pt seen for aquatic therapy today.  Treatment took place in water 3.5-4.75 ft in depth at the [facility] pool. Temp of water was 91.  Pt entered/exited the pool via stairs with hand rail.  Exercises - walking forward/ backward   - Side Stepping with or without Hand floats  - Forward March with Same Side/Opposite Arm Knee Taps to Hand Floats   - Pool noodle/  hand float pull down to front of thighs   - Staggered Stance Row with Kick Board   - Tandem Stance with Head Rotation  - Hip Hinge with Kick board   - Single Leg Hip Hinge holding noodle   -  Seated on noodle, bicycle legs - 3 Way Leg Stretch with Ankle on Pool Noodle  - Side Stepping with Resistance at Ankles and Counter Support   - Forward and Backward Monster Walk with Resistance at Ankles and Counter Support   - Wall Squat with Leg Lifts   - Single Leg Stance    [facility] Adult PT Treatment:                                                DATE: 06/25/23 Neuromuscular re-ed: PPT + wall slide x10 PPT + wall squat + marching 2x10 Standing slow march 10x3" working on pelvic stability Standing SLS 3x15" Therapeutic Activity: Nustep L5 x 6 min UEs/LEs Side step red TB x5 laps by counter Fwd/bwd monster walk red TB x5 laps by counter Forward march + alternating arms red TB x10; however, started to irritate L SI Self Care: HEP updates   [facility] Adult PT Treatment:                                                DATE: 06/23/23 Pt seen for aquatic therapy today.  Treatment took place in water 3.5-4.75 ft in depth at the [facility] pool. Temp of water was 91.  Pt entered/exited the pool via stairs with hand rail.   -farmers carry with yellow hand floats bilaterally then unilaterally forward and back - Side Stepping with rainbow Hand floats   - Forward March with Same Side/Opposite Arm Knee Taps to rainbow Hand Floats   - Pool noodle(long hollow) /  rainbow and  yellow hand float pull down to front of thighs -> press down on yellow noodle - Staggered Stance Row with [employer], cues for increased speed - tandem stance with horiz head turns, 4 ft and 37ft6"; SLS in 66ft6" (see above) - Hip Hinge with yellow noodle x 10  - Single Leg superman x 10 each LE (demo and cues for form) - 3 way LE stretch with hollow noodle (cramp in LE)-> hamstring stretch with foot on 2nd/3rd step 15s each LE; adductor stretch x 15s each LE  4/17  There-ex  Nu-step 5 min L5  Reviewed and updated HEP  Reviewed the anatomy of stenosis an transverse abdominal muscles    Nuero re-ed     [facility] Adult PT Treatment:                                                DATE: 06/15/23 Pt seen for aquatic therapy today.  Treatment took place in water 3.5-4.75 ft in depth at the [facility] pool. Temp of water was 91.  Pt entered/exited the pool via stairs with hand rail.  Exercises - walking forward/ backward   -farmers carry bilaterally then unilaterally forward and back - Side Stepping with or without Hand floats   - Forward March with Same Side/Opposite Arm Knee Taps to Hand Floats   - Pool noodle/  hand float pull down to front of thighs   - Staggered Stance Row with Kick Board - Hip Hinge with [employer]  - Single  Leg Hip Hinge holding noodle      PATIENT EDUCATION:  Education details: aquatic therapy progression / modifications Person educated: Patient Education method: Explanation Education comprehension: verbalized understanding  HOME EXERCISE PROGRAM: AQUATIC Access Code: [email or web address] URL: [email or web address] Date: 06/23/2023 Prepared by: [facility] [address] This aquatic home exercise program from Med[employer] utilizes pictures from land based exercises, but has been adapted prior to lamination and issuance.     [id number]  ASSESSMENT:  CLINICAL IMPRESSION: 5/8 Pt warmed up on the nustep for with no increase in  symptoms. Therapeutic exercises were primary focus of today's session. All exercises were done with cueing for posture and muscle activation. Pt really enjoyed exercise selection today more than general gym machines. Pt will continue to benefit from skilled physical therapy to increase functional strength for ADL's. The patient is making progress over all. Her ability to ambulate has improved. She has improved her ability to perform her ADL's. She would continue to benefit from physical therapy to finalize gym HEP   Initial impression Patient is a 73 y.o. f who was seen today for physical therapy evaluation and treatment for Radiculopathy, lumbar region. She reports dysesthesias posterior calf pain and pain in the sole of her foot with associated significant calf cramping which has subsided since taking gabapentin .  Steroid injections in lumbar spine and r SIJ has significantly decreased LBP but she continues to have episodes of radicular discomfort in right anteromedial thigh to knee she reports as tightness and tingling.  She is an active senior but has become afraid of falling (has not had any falls) due to an increase in general weakness and reports of general muscle atrophy. She does follow her own exercise program walking on treadmill x 40 mins + 5 days a week but does not do other things she enjoys doing outside her home due to fear of falling. Objective and functional testing demonstrate LE and core weakness.   She will benefit from skilled PT intervention to improve all areas of deficits and return pt to improved QOL. Will begin in aquatics then transition land based when approp.    OBJECTIVE IMPAIRMENTS: decreased activity tolerance, decreased balance, decreased endurance, decreased mobility, difficulty walking, and decreased strength.     GOALS: Goals reviewed with patient? Yes  SHORT TERM GOALS: Target date: 05/10/23  Pt will tolerate full aquatic sessions consistently without increase in  pain and with improving function to demonstrate good toleration and effectiveness of intervention.  Baseline: Goal status: MET - 05/18/23  2. Pt will perform SLS and tandem stance in 3.6 ft unsupported x 20s Baseline: see chart Goal status:  Partially met -06/23/23/ Met 06/30/23  3.Pt will report 50% decrease in fear of falling. Baseline: Goal status: MET- 06/23/23  4.  Pt will improve on 5 X STS test to <or= 10s to demonstrate improving functional lower extremity strength, transitional movements, and balance Baseline: 13.80 at eval;  see above Goal status: Met- 05/18/23   LONG TERM GOALS: Target date: POC  Pt will improve on ODI by 25% to demonstrate improved function ability "to do more". Baseline: 62% Goal status: INITIAL  2.  Pt will report running vacuum cleaner (Complete house work) without limitation to fatigue or pain Baseline:  Goal status: IN PROGRESS 5/6  3.  Pt to be able to take trash can to and from curb safely Baseline:  Goal status: MET - 06/23/23  4.  Pt will report decreased waking from cramps at night to  1x week or less Baseline: 3 at eval; varies - 06/23/23 Goal status: IN PROGRESS   5.  Pt will improve strength in all areas listed by at least 10 lbs  to demonstrate improved overall physical function Baseline: see chart Goal status: INITIAL  6.   Pt will report ability to complete a "full workout" (completing cardio/ lifting)  Baseline:  Goal status: INITIAL  PLAN:  PT FREQUENCY: 2x/week  PT DURATION: 8 weeks  PLANNED INTERVENTIONS: 97164- PT Re-evaluation, 97110-Therapeutic exercises, 97530- Therapeutic activity, 97112- Neuromuscular re-education, 97535- Self Care, 91478- Manual therapy, [phone]- Gait training, [phone]- Orthotic Fit/training, [phone]- Aquatic Therapy, 97014- Electrical stimulation (unattended), [phone]- Ionotophoresis 4mg /ml Dexamethasone , Patient/Family education, Balance training, Stair training, Taping, Dry Needling, Joint mobilization, DME  instructions, Cryotherapy, and Moist heat.  PLAN FOR NEXT SESSION: aquatics: general strengthening; balance retraining HEP  Kitty Perkins, PT 07/09/23 9:19 AM [facility] [address] [place], [place], [address] Phone: [phone]   Fax:  [phone]

## 2023-07-09 ENCOUNTER — Encounter (HOSPITAL_BASED_OUTPATIENT_CLINIC_OR_DEPARTMENT_OTHER): Payer: Self-pay | Admitting: Physical Therapy

## 2023-07-13 ENCOUNTER — Ambulatory Visit (HOSPITAL_BASED_OUTPATIENT_CLINIC_OR_DEPARTMENT_OTHER): Admitting: Physical Therapy

## 2023-07-13 ENCOUNTER — Encounter (HOSPITAL_BASED_OUTPATIENT_CLINIC_OR_DEPARTMENT_OTHER): Payer: Self-pay | Admitting: Physical Therapy

## 2023-07-13 DIAGNOSIS — M6281 Muscle weakness (generalized): Secondary | ICD-10-CM

## 2023-07-13 DIAGNOSIS — R2689 Other abnormalities of gait and mobility: Secondary | ICD-10-CM

## 2023-07-13 DIAGNOSIS — M5459 Other low back pain: Secondary | ICD-10-CM

## 2023-07-13 NOTE — Therapy (Unsigned)
 OUTPATIENT PHYSICAL THERAPY THORACOLUMBAR TREATMENT/progress note    Patient Name: Julie Tapia MRN: [id number] DOB:09-03-1950, 73 y.o., female Today's Date: 07/13/2023  END OF SESSION:  PT End of Session - 07/13/23 1407     Visit Number 21    Date for PT Re-Evaluation 07/30/23    Authorization Type mcr    PT Start Time 1015    PT Stop Time 1058    PT Time Calculation (min) 43 min    Activity Tolerance Patient tolerated treatment well;No increased pain    Behavior During Therapy [facility] for tasks assessed/performed                Past Medical History:  Diagnosis Date   Aneurysm (HCC) 01-16-11   "lt .side of head not intra(cavity area)/Found during series of tests to r/o TIA's"-report from [facility] showed  Lt. ICA  aneurysm   Arthritis 01-16-11   osteoarthritis,-knees, hips, ankles. 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 tight chested-"no inhalers in yrs.   Complication of anesthesia 01-16-11   Pt. very slow to awaken and was days before feeling like herself.   Past Surgical History:  Procedure Laterality Date   ABDOMINAL HYSTERECTOMY     BREAST CYST EXCISION  01-16-11   several excisions in the past   JOINT REPLACEMENT  01-16-11   Rt. great toe ball joint replacement-metal   REDUCTION MAMMAPLASTY  01-16-11   Bil. '86( problems with s/p surgical clot at rt. breast drainage site)   TOTAL HIP ARTHROPLASTY  01/20/2011   Procedure: TOTAL HIP ARTHROPLASTY ANTERIOR APPROACH;  Surgeon: Bevin Bucks;  Location: [facility];  Service: Orthopedics;  Laterality: Right;   Patient Active Problem List   Diagnosis Date Noted   Myofascial pain syndrome of lumbar spine 05/25/2023   Chronic bilateral low back pain with bilateral sciatica 05/25/2023   Muscle cramps 05/25/2023   Nerve pain due to spinal stenosis 05/25/2023   Benign fasciculations 05/25/2023   Cerebral aneurysm 01/12/2021   PFO (patent foramen ovale)  01/12/2021  Progress Note/re-cert Reporting Period 06/02/23 to 07/09/2023  PCP: Barnetta Liberty MD  REFERRING PROVIDER:   Michele Ahle, MD    REFERRING DIAG: M54.16 (ICD-10-CM) - Radiculopathy, lumbar region   Rationale for Evaluation and Treatment: Rehabilitation  THERAPY DIAG:  Muscle weakness (generalized)  Other abnormalities of gait and mobility  Other low back pain  ONSET DATE: last year  SUBJECTIVE:  SUBJECTIVE STATEMENT: The patient enjoyed the last visit. She reports she felt good. She had no significant increase in pain. She was a little sore this weekend because she did a lot of house cleaning.   Initial Subjective Began/Back injection 2007-8.  Went dormant.  Returned 2020.  Summer 2024 hurt my back lifting case of water.  Have had back pain continual since. Phares Brasher has given injections in lumbar spine and L SIJ. Fasciculation/cramps were bad in my calves.  Better with gabapentin  low dose.  Most nights I can sleep (2-3 x week not sleeping). I have atrophied a lot.  Have a treadmill that I walk on it 5/7 days a week 6000-10000 step.  I immediately lay on mat doing exercises I was shown form other PT in past after finishing on bike. May need to have my left hip replaced pain and stiffness there.  I feel stiff in my back and hip. Weakness has been significantly decreasing since the summer. Tested for ALS neg  PERTINENT HISTORY:  R THR  PAIN:  Are you having pain? no: NPRS scale: current 0/10 Pain location:  Pain description:  Aggravating factors: walking on treadmill Relieving factors: rest/ stretching, meds  PRECAUTIONS: None  RED FLAGS: None   WEIGHT BEARING RESTRICTIONS: No  FALLS:  Has patient fallen in last 6 months? No  LIVING ENVIRONMENT: Lives with: lives alone Lives  in: House/apartment Stairs: Yes: External: 8 steps; on right going up Has following equipment at home: None  OCCUPATION: retired last year;  PLOF: Independent  PATIENT GOALS: Increase strength, improve overall health, balance to do more  NEXT MD VISIT: end of March  OBJECTIVE:  Note: Objective measures were completed at Evaluation unless otherwise noted.  DIAGNOSTIC FINDINGS:  MRI lumbar spine dated October 08, 2018 shows type II lumbosacral transitional vertebrae. At L2-3 there is moderate left and mild to moderate right neural foraminal narrowing with contact of the exiting bilateral L2 nerve roots worse on the left than the right. There is moderate left subarticular zone narrowing with mild spinal canal stenosis impinging upon with mild to moderate displacement of the left L3 nerve root due to a very small central disc extrusion superimposed upon a severe bulging disc osteophyte complex asymmetric to the left. At L3-4 there is a disc extrusion superimposed upon a severe left lateral bulging disc osteophyte and severe left disc height loss resulting in moderate to severe left foraminal narrowing and impingement upon the exiting left L3 nerve root. Moderate left and mild right subarticular zone narrowing and mild spinal canal stenosis with impingement upon the descending left L4 nerve root. At L4-5 there is moderate to severe left and moderate right facet arthrosis. Mild dextroconvex lumbar scoliosis with apex at L3.   PATIENT SURVEYS:  ODI 14(2)=28/45= 62%  COGNITION: Overall cognitive status: Within functional limits for tasks assessed     SENSATION: dysesthesias posterior calf pain and pain in the sole of her foot  MUSCLE LENGTH: Hamstrings: tight   POSTURE: rounded shoulders and increased thoracic kyphosis   LUMBAR ROM:  wfl  LOWER EXTREMITY ROM:   wfl  LOWER EXTREMITY MMT:    MMT Right eval Left eval  Hip flexion 39.5 29.8  Hip extension    Hip abduction 13.9 18.0   Hip adduction    Hip internal rotation    Hip external rotation    Knee flexion    Knee extension 26.4 22.6  Ankle dorsiflexion    Ankle plantarflexion    Ankle inversion  Ankle eversion     (Blank rows = not tested)  LUMBAR SPECIAL TESTS:  Straight leg raise test: Negative and Slump test: Negative  FUNCTIONAL TESTS:  5 times sit to stand: 13.80 Timed up and go (TUG): 10.79 4 stage: passed 1&2.  Tandem x 5s; SLS x 7 05/18/23:  5x STS:  10.5s 06/23/23: in 86ft6" water: tandem stance 20sec each LE;  SLS -R 20s, L up to 14s (challenge)  GAIT: Distance walked: unlimited distance Assistive device utilized: None Level of assistance: Complete Independence Comments: wfl  5/9: walking without pain   5/9 full ROM of the back and hip wthout pain   5/9: improved ability to perfrom gym activity and exercises.   TREATMENT                                                                                                                          5/13  There-ex:  Nu-step 5 min L5 reviewed set up   Neuro-re-ed:  TRX squats 3x10 cuing for posture and technique   Dead lifts 13 lbs x10 RPE of 4  26 lbs RPE of 8 x10  15 lbs DB x10   Goblet squat 10lb 3x10 with cuing in mirror. Min cuing for technique  Biceps curls with cuing for making it a core exercise.   With each exercise we discussed how to grade her exercises. She was given an RPE sheet.     5/8  There-ex: Incline db press 5lbs 3x10 Step up hip flex hold 2x10 KB DL 1X91 TRX rows 4N82 TRX squats 2x10 Leg press 70lbs 2x10    5/6:  There-ex:   Thomas stretch 30sec x2ea -Bridge with ball squeeze 2x10  Neuro re-ed -hooklying hip abduction isometric with gait belt 5" 2x10 -supine marching with TA- also from 90/90 2x20 Quadruped alt UE extension Quadruped alt LE ext x10 (with cues for pelvic alignment) Review/update of HEP     Manual: STM and TPR to L gluteal mm   5/3:   There-ex:   Reviewed set up  of the exercise bike. She had minor pain at first but it improved as she exercised  Chest press 5 lbs x10 RPE of 4 6 lbs 2x10 RPE of 5  Neuro-re-ed:  Punch with cuing for posture and breathing 3 lbs RPE of 6 3x10  Curl with posture 4,5,6 pounds x10 each with RPE progressing to 6  Tricps press down 25 lbs 3x12 RPE of 5   Attempted hip abduction and abduction but patient unable to perform 2nd to pain      [facility] Adult PT Treatment:                                                DATE: 06/30/23 Pt seen for aquatic therapy today.  Treatment took place in water 3.5-4.75 ft  in depth at the [facility] pool. Temp of water was 91.  Pt entered/exited the pool via stairs with hand rail.  Exercises - walking forward/ backward   - Side Stepping with or without Hand floats  - Forward March with Same Side/Opposite Arm Knee Taps to Hand Floats   - Pool noodle/  hand float pull down to front of thighs   - Staggered Stance Row with Kick Board   - Tandem Stance with Head Rotation  - Hip Hinge with Kick board   - Single Leg Hip Hinge holding noodle   - Seated on noodle, bicycle legs - 3 Way Leg Stretch with Ankle on Pool Noodle  - Side Stepping with Resistance at Ankles and Counter Support   - Forward and Backward Monster Walk with Resistance at Ankles and Counter Support   - Wall Squat with Leg Lifts   - Single Leg Stance    [facility] Adult PT Treatment:                                                DATE: 06/25/23 Neuromuscular re-ed: PPT + wall slide x10 PPT + wall squat + marching 2x10 Standing slow march 10x3" working on pelvic stability Standing SLS 3x15" Therapeutic Activity: Nustep L5 x 6 min UEs/LEs Side step red TB x5 laps by counter Fwd/bwd monster walk red TB x5 laps by counter Forward march + alternating arms red TB x10; however, started to irritate L SI Self Care: HEP updates   [facility] Adult PT Treatment:                                                DATE: 06/23/23 Pt seen for  aquatic therapy today.  Treatment took place in water 3.5-4.75 ft in depth at the [facility] pool. Temp of water was 91.  Pt entered/exited the pool via stairs with hand rail.   -farmers carry with yellow hand floats bilaterally then unilaterally forward and back - Side Stepping with rainbow Hand floats   - Forward March with Same Side/Opposite Arm Knee Taps to rainbow Hand Floats   - Pool noodle(long hollow) /  rainbow and yellow hand float pull down to front of thighs -> press down on yellow noodle - Staggered Stance Row with [employer], cues for increased speed - tandem stance with horiz head turns, 4 ft and 64ft6"; SLS in 21ft6" (see above) - Hip Hinge with yellow noodle x 10  - Single Leg superman x 10 each LE (demo and cues for form) - 3 way LE stretch with hollow noodle (cramp in LE)-> hamstring stretch with foot on 2nd/3rd step 15s each LE; adductor stretch x 15s each LE  4/17  There-ex  Nu-step 5 min L5  Reviewed and updated HEP  Reviewed the anatomy of stenosis an transverse abdominal muscles    Nuero re-ed     [facility] Adult PT Treatment:                                                DATE: 06/15/23 Pt seen for aquatic therapy  today.  Treatment took place in water 3.5-4.75 ft in depth at the [facility] pool. Temp of water was 91.  Pt entered/exited the pool via stairs with hand rail.  Exercises - walking forward/ backward   -farmers carry bilaterally then unilaterally forward and back - Side Stepping with or without Hand floats   - Forward March with Same Side/Opposite Arm Knee Taps to Hand Floats   - Pool noodle/  hand float pull down to front of thighs   - Staggered Stance Row with Kick Board - Hip Hinge with Kick board  - Single Leg Hip Hinge holding noodle      PATIENT EDUCATION:  Education details: aquatic therapy progression / modifications Person educated: Patient Education method: Explanation Education comprehension: verbalized  understanding  HOME EXERCISE PROGRAM: AQUATIC Access Code: [email or web address] URL: [email or web address] Date: 06/23/2023 Prepared by: [facility] - [address] This aquatic home exercise program from [employer] utilizes pictures from land based exercises, but has been adapted prior to lamination and issuance.     [id number]  ASSESSMENT:  CLINICAL IMPRESSION: Therapy continues to expand the patients exercise program. We had her upstairs int he gym today working more on free weights and exercise concepts. She tolerated very well. We worked on grading her exercises and weights. We added goblet squats to our program. We will continue to expand her program with the plan of DC next week.    Initial impression Patient is a 73 y.o. f who was seen today for physical therapy evaluation and treatment for Radiculopathy, lumbar region. She reports dysesthesias posterior calf pain and pain in the sole of her foot with associated significant calf cramping which has subsided since taking gabapentin .  Steroid injections in lumbar spine and r SIJ has significantly decreased LBP but she continues to have episodes of radicular discomfort in right anteromedial thigh to knee she reports as tightness and tingling.  She is an active senior but has become afraid of falling (has not had any falls) due to an increase in general weakness and reports of general muscle atrophy. She does follow her own exercise program walking on treadmill x 40 mins + 5 days a week but does not do other things she enjoys doing outside her home due to fear of falling. Objective and functional testing demonstrate LE and core weakness.   She will benefit from skilled PT intervention to improve all areas of deficits and return pt to improved QOL. Will begin in aquatics then transition land based when approp.    OBJECTIVE IMPAIRMENTS: decreased activity tolerance, decreased balance, decreased endurance, decreased mobility,  difficulty walking, and decreased strength.     GOALS: Goals reviewed with patient? Yes  SHORT TERM GOALS: Target date: 05/10/23  Pt will tolerate full aquatic sessions consistently without increase in pain and with improving function to demonstrate good toleration and effectiveness of intervention.  Baseline: Goal status: MET - 05/18/23  2. Pt will perform SLS and tandem stance in 3.6 ft unsupported x 20s Baseline: see chart Goal status:  Partially met -06/23/23/ Met 06/30/23  3.Pt will report 50% decrease in fear of falling. Baseline: Goal status: MET- 06/23/23  4.  Pt will improve on 5 X STS test to <or= 10s to demonstrate improving functional lower extremity strength, transitional movements, and balance Baseline: 13.80 at eval;  see above Goal status: Met- 05/18/23   LONG TERM GOALS: Target date: POC  Pt will improve on ODI by 25% to demonstrate improved function ability "to do more".  Baseline: 62% Goal status: INITIAL  2.  Pt will report running vacuum cleaner (Complete house work) without limitation to fatigue or pain Baseline:  Goal status: IN PROGRESS 5/6  3.  Pt to be able to take trash can to and from curb safely Baseline:  Goal status: MET - 06/23/23  4.  Pt will report decreased waking from cramps at night to 1x week or less Baseline: 3 at eval; varies - 06/23/23 Goal status: IN PROGRESS   5.  Pt will improve strength in all areas listed by at least 10 lbs  to demonstrate improved overall physical function Baseline: see chart Goal status: INITIAL  6.   Pt will report ability to complete a "full workout" (completing cardio/ lifting)  Baseline:  Goal status: INITIAL  PLAN:  PT FREQUENCY: 2x/week  PT DURATION: 8 weeks  PLANNED INTERVENTIONS: 97164- PT Re-evaluation, 97110-Therapeutic exercises, 97530- Therapeutic activity, 97112- Neuromuscular re-education, 97535- Self Care, 71696- Manual therapy, [phone]- Gait training, [phone]- Orthotic Fit/training, [phone]-  Aquatic Therapy, 97014- Electrical stimulation (unattended), [phone]- Ionotophoresis 4mg /ml Dexamethasone , Patient/Family education, Balance training, Stair training, Taping, Dry Needling, Joint mobilization, DME instructions, Cryotherapy, and Moist heat.  PLAN FOR NEXT SESSION: aquatics: general strengthening; balance retraining HEP  Kitty Perkins, PT 07/13/23 2:08 PM [facility] [address] [place], [place], [address] Phone: [phone]   Fax:  [phone]

## 2023-07-14 ENCOUNTER — Encounter (HOSPITAL_BASED_OUTPATIENT_CLINIC_OR_DEPARTMENT_OTHER): Payer: Self-pay | Admitting: Physical Therapy

## 2023-07-15 ENCOUNTER — Ambulatory Visit (HOSPITAL_BASED_OUTPATIENT_CLINIC_OR_DEPARTMENT_OTHER)

## 2023-07-20 ENCOUNTER — Ambulatory Visit (HOSPITAL_BASED_OUTPATIENT_CLINIC_OR_DEPARTMENT_OTHER): Admitting: Physical Therapy

## 2023-07-20 ENCOUNTER — Encounter (HOSPITAL_BASED_OUTPATIENT_CLINIC_OR_DEPARTMENT_OTHER): Payer: Self-pay | Admitting: Physical Therapy

## 2023-07-20 DIAGNOSIS — R2689 Other abnormalities of gait and mobility: Secondary | ICD-10-CM | POA: Diagnosis not present

## 2023-07-20 DIAGNOSIS — M6281 Muscle weakness (generalized): Secondary | ICD-10-CM | POA: Diagnosis not present

## 2023-07-20 DIAGNOSIS — M5459 Other low back pain: Secondary | ICD-10-CM | POA: Diagnosis not present

## 2023-07-20 NOTE — Therapy (Signed)
 OUTPATIENT PHYSICAL THERAPY THORACOLUMBAR TREATMENT/progress note    Patient Name: Julie Tapia MRN: [id number] DOB:11-07-1950, 73 y.o., female Today's Date: 07/20/2023  END OF SESSION:  PT End of Session - 07/20/23 1101     Visit Number 22    Date for PT Re-Evaluation 07/30/23    PT Start Time 1015    PT Stop Time 1100    PT Time Calculation (min) 45 min    Activity Tolerance Patient tolerated treatment well;No increased pain    Behavior During Therapy [facility] for tasks assessed/performed                 Past Medical History:  Diagnosis Date   Aneurysm (HCC) 01-16-11   "lt .side of head not intra(cavity area)/Found during series of tests to r/o TIA's"-report from [facility] showed  Lt. ICA  aneurysm   Arthritis 01-16-11   osteoarthritis,-knees, hips, ankles. 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 tight chested-"no inhalers in yrs.   Complication of anesthesia 01-16-11   Pt. very slow to awaken and was days before feeling like herself.   Past Surgical History:  Procedure Laterality Date   ABDOMINAL HYSTERECTOMY     BREAST CYST EXCISION  01-16-11   several excisions in the past   JOINT REPLACEMENT  01-16-11   Rt. great toe ball joint replacement-metal   REDUCTION MAMMAPLASTY  01-16-11   Bil. '86( problems with s/p surgical clot at rt. breast drainage site)   TOTAL HIP ARTHROPLASTY  01/20/2011   Procedure: TOTAL HIP ARTHROPLASTY ANTERIOR APPROACH;  Surgeon: Bevin Bucks;  Location: [facility];  Service: Orthopedics;  Laterality: Right;   Patient Active Problem List   Diagnosis Date Noted   Myofascial pain syndrome of lumbar spine 05/25/2023   Chronic bilateral low back pain with bilateral sciatica 05/25/2023   Muscle cramps 05/25/2023   Nerve pain due to spinal stenosis 05/25/2023   Benign fasciculations 05/25/2023   Cerebral aneurysm 01/12/2021   PFO (patent foramen ovale) 01/12/2021  Progress  Note/re-cert Reporting Period 06/02/23 to 07/09/2023  PCP: Barnetta Liberty MD  REFERRING PROVIDER:   Michele Ahle, MD    REFERRING DIAG: M54.16 (ICD-10-CM) - Radiculopathy, lumbar region   Rationale for Evaluation and Treatment: Rehabilitation  THERAPY DIAG:  Muscle weakness (generalized)  Other abnormalities of gait and mobility  Other low back pain  ONSET DATE: last year  SUBJECTIVE:  SUBJECTIVE STATEMENT: The patient continues to work on her exercises at home. She reports she is doing pretty well. She is seeing the MD tomorrow.    Initial Subjective Began/Back injection 2007-8.  Went dormant.  Returned 2020.  Summer 2024 hurt my back lifting case of water.  Have had back pain continual since. Phares Brasher has given injections in lumbar spine and L SIJ. Fasciculation/cramps were bad in my calves.  Better with gabapentin  low dose.  Most nights I can sleep (2-3 x week not sleeping). I have atrophied a lot.  Have a treadmill that I walk on it 5/7 days a week 6000-10000 step.  I immediately lay on mat doing exercises I was shown form other PT in past after finishing on bike. May need to have my left hip replaced pain and stiffness there.  I feel stiff in my back and hip. Weakness has been significantly decreasing since the summer. Tested for ALS neg  PERTINENT HISTORY:  R THR  PAIN:  Are you having pain? no: NPRS scale: current 0/10 Pain location:  Pain description:  Aggravating factors: walking on treadmill Relieving factors: rest/ stretching, meds  PRECAUTIONS: None  RED FLAGS: None   WEIGHT BEARING RESTRICTIONS: No  FALLS:  Has patient fallen in last 6 months? No  LIVING ENVIRONMENT: Lives with: lives alone Lives in: House/apartment Stairs: Yes: External: 8 steps; on right going  up Has following equipment at home: None  OCCUPATION: retired last year;  PLOF: Independent  PATIENT GOALS: Increase strength, improve overall health, balance to do more  NEXT MD VISIT: end of March  OBJECTIVE:  Note: Objective measures were completed at Evaluation unless otherwise noted.  DIAGNOSTIC FINDINGS:  MRI lumbar spine dated October 08, 2018 shows type II lumbosacral transitional vertebrae. At L2-3 there is moderate left and mild to moderate right neural foraminal narrowing with contact of the exiting bilateral L2 nerve roots worse on the left than the right. There is moderate left subarticular zone narrowing with mild spinal canal stenosis impinging upon with mild to moderate displacement of the left L3 nerve root due to a very small central disc extrusion superimposed upon a severe bulging disc osteophyte complex asymmetric to the left. At L3-4 there is a disc extrusion superimposed upon a severe left lateral bulging disc osteophyte and severe left disc height loss resulting in moderate to severe left foraminal narrowing and impingement upon the exiting left L3 nerve root. Moderate left and mild right subarticular zone narrowing and mild spinal canal stenosis with impingement upon the descending left L4 nerve root. At L4-5 there is moderate to severe left and moderate right facet arthrosis. Mild dextroconvex lumbar scoliosis with apex at L3.   PATIENT SURVEYS:  ODI 14(2)=28/45= 62%  COGNITION: Overall cognitive status: Within functional limits for tasks assessed     SENSATION: dysesthesias posterior calf pain and pain in the sole of her foot  MUSCLE LENGTH: Hamstrings: tight   POSTURE: rounded shoulders and increased thoracic kyphosis   LUMBAR ROM:  wfl  LOWER EXTREMITY ROM:   wfl  LOWER EXTREMITY MMT:    MMT Right eval Left eval  Hip flexion 39.5 29.8  Hip extension    Hip abduction 13.9 18.0  Hip adduction    Hip internal rotation    Hip external rotation     Knee flexion    Knee extension 26.4 22.6  Ankle dorsiflexion    Ankle plantarflexion    Ankle inversion    Ankle eversion     (  Blank rows = not tested)  LUMBAR SPECIAL TESTS:  Straight leg raise test: Negative and Slump test: Negative  FUNCTIONAL TESTS:  5 times sit to stand: 13.80 Timed up and go (TUG): 10.79 4 stage: passed 1&2.  Tandem x 5s; SLS x 7 05/18/23:  5x STS:  10.5s 06/23/23: in 75ft6" water: tandem stance 20sec each LE;  SLS -R 20s, L up to 14s (challenge)  GAIT: Distance walked: unlimited distance Assistive device utilized: None Level of assistance: Complete Independence Comments: wfl  5/9: walking without pain   5/9 full ROM of the back and hip wthout pain   5/9: improved ability to perfrom gym activity and exercises.   TREATMENT                                                                                                                          5/20  Discussed patients exercise program with her and how to use it. Spent time going over core breathing for her back and hip     Neuro-re-ed  Triceps pull down cable: Keep arms pinned at your sides. Straighten elbows. Eye level 12.5 3x12   Shoulder extension: eye level. Straight arm straight down 12.5 3x12   Row: Pull straight back to belly button 12.5 lbs 3x12  Breath out and tighten stomach with all    Biceps curl with posture and breathing 3x10    5/13  There-ex:  Nu-step 5 min L5 reviewed set up   Neuro-re-ed:  TRX squats 3x10 cuing for posture and technique   Dead lifts 13 lbs x10 RPE of 4  26 lbs RPE of 8 x10  15 lbs DB x10   Goblet squat 10lb 3x10 with cuing in mirror. Min cuing for technique  Biceps curls with cuing for making it a core exercise.   With each exercise we discussed how to grade her exercises. She was given an RPE sheet.     5/8  There-ex: Incline db press 5lbs 3x10 Step up hip flex hold 2x10 KB DL 4V40 TRX rows 9W11 TRX squats 2x10 Leg press 70lbs  2x10    5/6:  There-ex:   Thomas stretch 30sec x2ea -Bridge with ball squeeze 2x10  Neuro re-ed -hooklying hip abduction isometric with gait belt 5" 2x10 -supine marching with TA- also from 90/90 2x20 Quadruped alt UE extension Quadruped alt LE ext x10 (with cues for pelvic alignment) Review/update of HEP     Manual: STM and TPR to L gluteal mm   5/3:   There-ex:   Reviewed set up of the exercise bike. She had minor pain at first but it improved as she exercised  Chest press 5 lbs x10 RPE of 4 6 lbs 2x10 RPE of 5  Neuro-re-ed:  Punch with cuing for posture and breathing 3 lbs RPE of 6 3x10  Curl with posture 4,5,6 pounds x10 each with RPE progressing to 6  Tricps press down 25 lbs 3x12 RPE of 5  Attempted hip abduction and abduction but patient unable to perform 2nd to pain      [facility] Adult PT Treatment:                                                DATE: 06/30/23 Pt seen for aquatic therapy today.  Treatment took place in water 3.5-4.75 ft in depth at the [facility] pool. Temp of water was 91.  Pt entered/exited the pool via stairs with hand rail.  Exercises - walking forward/ backward   - Side Stepping with or without Hand floats  - Forward March with Same Side/Opposite Arm Knee Taps to Hand Floats   - Pool noodle/  hand float pull down to front of thighs   - Staggered Stance Row with Kick Board   - Tandem Stance with Head Rotation  - Hip Hinge with Kick board   - Single Leg Hip Hinge holding noodle   - Seated on noodle, bicycle legs - 3 Way Leg Stretch with Ankle on Pool Noodle  - Side Stepping with Resistance at Ankles and Counter Support   - Forward and Backward Monster Walk with Resistance at Ankles and Counter Support   - Wall Squat with Leg Lifts   - Single Leg Stance    [facility] Adult PT Treatment:                                                DATE: 06/25/23 Neuromuscular re-ed: PPT + wall slide x10 PPT + wall squat + marching  2x10 Standing slow march 10x3" working on pelvic stability Standing SLS 3x15" Therapeutic Activity: Nustep L5 x 6 min UEs/LEs Side step red TB x5 laps by counter Fwd/bwd monster walk red TB x5 laps by counter Forward march + alternating arms red TB x10; however, started to irritate L SI Self Care: HEP updates   [facility] Adult PT Treatment:                                                DATE: 06/23/23 Pt seen for aquatic therapy today.  Treatment took place in water 3.5-4.75 ft in depth at the [facility] pool. Temp of water was 91.  Pt entered/exited the pool via stairs with hand rail.   -farmers carry with yellow hand floats bilaterally then unilaterally forward and back - Side Stepping with rainbow Hand floats   - Forward March with Same Side/Opposite Arm Knee Taps to rainbow Hand Floats   - Pool noodle(long hollow) /  rainbow and yellow hand float pull down to front of thighs -> press down on yellow noodle - Staggered Stance Row with [employer], cues for increased speed - tandem stance with horiz head turns, 4 ft and 74ft6"; SLS in 26ft6" (see above) - Hip Hinge with yellow noodle x 10  - Single Leg superman x 10 each LE (demo and cues for form) - 3 way LE stretch with hollow noodle (cramp in LE)-> hamstring stretch with foot on 2nd/3rd step 15s each LE; adductor stretch x 15s each LE  4/17  There-ex  Nu-step  5 min L5  Reviewed and updated HEP  Reviewed the anatomy of stenosis an transverse abdominal muscles    Nuero re-ed     [facility] Adult PT Treatment:                                                DATE: 06/15/23 Pt seen for aquatic therapy today.  Treatment took place in water 3.5-4.75 ft in depth at the [facility] pool. Temp of water was 91.  Pt entered/exited the pool via stairs with hand rail.  Exercises - walking forward/ backward   -farmers carry bilaterally then unilaterally forward and back - Side Stepping with or without Hand floats   - Forward  March with Same Side/Opposite Arm Knee Taps to Hand Floats   - Pool noodle/  hand float pull down to front of thighs   - Staggered Stance Row with Kick Board - Hip Hinge with Kick board  - Single Leg Hip Hinge holding noodle      PATIENT EDUCATION:  Education details: aquatic therapy progression / modifications Person educated: Patient Education method: Explanation Education comprehension: verbalized understanding  HOME EXERCISE PROGRAM: AQUATIC Access Code: [email or web address] URL: [email or web address] Date: 06/23/2023 Prepared by: [facility] [address] This aquatic home exercise program from [employer] utilizes pictures from land based exercises, but has been adapted prior to lamination and issuance.     [id number]  ASSESSMENT:  CLINICAL IMPRESSION: Therapy continues to expand her exercise program. She requires cuing for technique and posture but she is requiring less as she trains more. We updated her HEP. We spoke with her about how to use it. She is doing more work at home at this point. Therapy will continue to progress as tolerated.   Initial impression Patient is a 73 y.o. f who was seen today for physical therapy evaluation and treatment for Radiculopathy, lumbar region. She reports dysesthesias posterior calf pain and pain in the sole of her foot with associated significant calf cramping which has subsided since taking gabapentin .  Steroid injections in lumbar spine and r SIJ has significantly decreased LBP but she continues to have episodes of radicular discomfort in right anteromedial thigh to knee she reports as tightness and tingling.  She is an active senior but has become afraid of falling (has not had any falls) due to an increase in general weakness and reports of general muscle atrophy. She does follow her own exercise program walking on treadmill x 40 mins + 5 days a week but does not do other things she enjoys doing outside her home due to  fear of falling. Objective and functional testing demonstrate LE and core weakness.   She will benefit from skilled PT intervention to improve all areas of deficits and return pt to improved QOL. Will begin in aquatics then transition land based when approp.    OBJECTIVE IMPAIRMENTS: decreased activity tolerance, decreased balance, decreased endurance, decreased mobility, difficulty walking, and decreased strength.     GOALS: Goals reviewed with patient? Yes  SHORT TERM GOALS: Target date: 05/10/23  Pt will tolerate full aquatic sessions consistently without increase in pain and with improving function to demonstrate good toleration and effectiveness of intervention.  Baseline: Goal status: MET - 05/18/23  2. Pt will perform SLS and tandem stance in 3.6 ft unsupported x 20s Baseline: see chart Goal status:  Partially met -06/23/23/ Met 06/30/23  3.Pt will report 50% decrease in fear of falling. Baseline: Goal status: MET- 06/23/23  4.  Pt will improve on 5 X STS test to <or= 10s to demonstrate improving functional lower extremity strength, transitional movements, and balance Baseline: 13.80 at eval;  see above Goal status: Met- 05/18/23   LONG TERM GOALS: Target date: POC  Pt will improve on ODI by 25% to demonstrate improved function ability "to do more". Baseline: 62% Goal status: INITIAL  2.  Pt will report running vacuum cleaner (Complete house work) without limitation to fatigue or pain Baseline:  Goal status: IN PROGRESS 5/6  3.  Pt to be able to take trash can to and from curb safely Baseline:  Goal status: MET - 06/23/23  4.  Pt will report decreased waking from cramps at night to 1x week or less Baseline: 3 at eval; varies - 06/23/23 Goal status: IN PROGRESS   5.  Pt will improve strength in all areas listed by at least 10 lbs  to demonstrate improved overall physical function Baseline: see chart Goal status: INITIAL  6.   Pt will report ability to complete a  "full workout" (completing cardio/ lifting)  Baseline:  Goal status: INITIAL  PLAN:  PT FREQUENCY: 2x/week  PT DURATION: 8 weeks  PLANNED INTERVENTIONS: 97164- PT Re-evaluation, 97110-Therapeutic exercises, 97530- Therapeutic activity, 97112- Neuromuscular re-education, 97535- Self Care, 56213- Manual therapy, [id number]- Gait training, [id number]- Orthotic Fit/training, [id number]- Aquatic Therapy, 97014- Electrical stimulation (unattended), [phone]- Ionotophoresis 4mg /ml Dexamethasone , Patient/Family education, Balance training, Stair training, Taping, Dry Needling, Joint mobilization, DME instructions, Cryotherapy, and Moist heat.  PLAN FOR NEXT SESSION: aquatics: general strengthening; balance retraining HEP  Kitty Perkins, PT 07/20/23 1:31 PM [facility] [address] [place], [place], [address] Phone: [phone]   Fax:  [phone]

## 2023-07-22 ENCOUNTER — Encounter (HOSPITAL_BASED_OUTPATIENT_CLINIC_OR_DEPARTMENT_OTHER): Payer: Self-pay | Admitting: Physical Therapy

## 2023-07-22 ENCOUNTER — Ambulatory Visit (HOSPITAL_BASED_OUTPATIENT_CLINIC_OR_DEPARTMENT_OTHER): Admitting: Physical Therapy

## 2023-07-22 DIAGNOSIS — M6281 Muscle weakness (generalized): Secondary | ICD-10-CM

## 2023-07-22 DIAGNOSIS — M1612 Unilateral primary osteoarthritis, left hip: Secondary | ICD-10-CM | POA: Diagnosis not present

## 2023-07-22 DIAGNOSIS — Z96641 Presence of right artificial hip joint: Secondary | ICD-10-CM | POA: Diagnosis not present

## 2023-07-22 DIAGNOSIS — R2689 Other abnormalities of gait and mobility: Secondary | ICD-10-CM | POA: Diagnosis not present

## 2023-07-22 DIAGNOSIS — M5459 Other low back pain: Secondary | ICD-10-CM

## 2023-07-22 NOTE — Therapy (Signed)
 OUTPATIENT PHYSICAL THERAPY THORACOLUMBAR TREATMENT/progress note    Patient Name: Julie Tapia MRN: [id number] DOB:1950/08/28, 73 y.o., female Today's Date: 07/22/2023  END OF SESSION:  PT End of Session - 07/22/23 1048     Visit Number 23    Date for PT Re-Evaluation 07/30/23    PT Start Time 1015    PT Stop Time 1058    PT Time Calculation (min) 43 min    Activity Tolerance Patient tolerated treatment well;No increased pain    Behavior During Therapy [facility] for tasks assessed/performed                 Past Medical History:  Diagnosis Date   Aneurysm (HCC) 01-16-11   "lt .side of head not intra(cavity area)/Found during series of tests to r/o TIA's"-report from [facility] showed  Lt. ICA  aneurysm   Arthritis 01-16-11   osteoarthritis,-knees, hips, ankles. 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 tight chested-"no inhalers in yrs.   Complication of anesthesia 01-16-11   Pt. very slow to awaken and was days before feeling like herself.   Past Surgical History:  Procedure Laterality Date   ABDOMINAL HYSTERECTOMY     BREAST CYST EXCISION  01-16-11   several excisions in the past   JOINT REPLACEMENT  01-16-11   Rt. great toe ball joint replacement-metal   REDUCTION MAMMAPLASTY  01-16-11   Bil. '86( problems with s/p surgical clot at rt. breast drainage site)   TOTAL HIP ARTHROPLASTY  01/20/2011   Procedure: TOTAL HIP ARTHROPLASTY ANTERIOR APPROACH;  Surgeon: Bevin Bucks;  Location: [facility];  Service: Orthopedics;  Laterality: Right;   Patient Active Problem List   Diagnosis Date Noted   Myofascial pain syndrome of lumbar spine 05/25/2023   Chronic bilateral low back pain with bilateral sciatica 05/25/2023   Muscle cramps 05/25/2023   Nerve pain due to spinal stenosis 05/25/2023   Benign fasciculations 05/25/2023   Cerebral aneurysm 01/12/2021   PFO (patent foramen ovale) 01/12/2021  Progress  Note/re-cert Reporting Period 06/02/23 to 07/09/2023  PCP: Barnetta Liberty MD  REFERRING PROVIDER:   Michele Ahle, MD    REFERRING DIAG: M54.16 (ICD-10-CM) - Radiculopathy, lumbar region   Rationale for Evaluation and Treatment: Rehabilitation  THERAPY DIAG:  Muscle weakness (generalized)  Other abnormalities of gait and mobility  Other low back pain  ONSET DATE: last year  SUBJECTIVE:  SUBJECTIVE STATEMENT: The patient will be seeing the MD today.  She will reports overall she feels like she has been getting a good workout with physical therapy.  She reports no significant increase in pain.   Initial Subjective Began/Back injection 2007-8.  Went dormant.  Returned 2020.  Summer 2024 hurt my back lifting case of water.  Have had back pain continual since. Phares Brasher has given injections in lumbar spine and L SIJ. Fasciculation/cramps were bad in my calves.  Better with gabapentin  low dose.  Most nights I can sleep (2-3 x week not sleeping). I have atrophied a lot.  Have a treadmill that I walk on it 5/7 days a week 6000-10000 step.  I immediately lay on mat doing exercises I was shown form other PT in past after finishing on bike. May need to have my left hip replaced pain and stiffness there.  I feel stiff in my back and hip. Weakness has been significantly decreasing since the summer. Tested for ALS neg  PERTINENT HISTORY:  R THR  PAIN:  Are you having pain? no: NPRS scale: current 0/10 Pain location:  Pain description:  Aggravating factors: walking on treadmill Relieving factors: rest/ stretching, meds  PRECAUTIONS: None  RED FLAGS: None   WEIGHT BEARING RESTRICTIONS: No  FALLS:  Has patient fallen in last 6 months? No  LIVING ENVIRONMENT: Lives with: lives alone Lives in:  House/apartment Stairs: Yes: External: 8 steps; on right going up Has following equipment at home: None  OCCUPATION: retired last year;  PLOF: Independent  PATIENT GOALS: Increase strength, improve overall health, balance to do more  NEXT MD VISIT: end of March  OBJECTIVE:  Note: Objective measures were completed at Evaluation unless otherwise noted.  DIAGNOSTIC FINDINGS:  MRI lumbar spine dated October 08, 2018 shows type II lumbosacral transitional vertebrae. At L2-3 there is moderate left and mild to moderate right neural foraminal narrowing with contact of the exiting bilateral L2 nerve roots worse on the left than the right. There is moderate left subarticular zone narrowing with mild spinal canal stenosis impinging upon with mild to moderate displacement of the left L3 nerve root due to a very small central disc extrusion superimposed upon a severe bulging disc osteophyte complex asymmetric to the left. At L3-4 there is a disc extrusion superimposed upon a severe left lateral bulging disc osteophyte and severe left disc height loss resulting in moderate to severe left foraminal narrowing and impingement upon the exiting left L3 nerve root. Moderate left and mild right subarticular zone narrowing and mild spinal canal stenosis with impingement upon the descending left L4 nerve root. At L4-5 there is moderate to severe left and moderate right facet arthrosis. Mild dextroconvex lumbar scoliosis with apex at L3.   PATIENT SURVEYS:  ODI 14(2)=28/45= 62%  COGNITION: Overall cognitive status: Within functional limits for tasks assessed     SENSATION: dysesthesias posterior calf pain and pain in the sole of her foot  MUSCLE LENGTH: Hamstrings: tight   POSTURE: rounded shoulders and increased thoracic kyphosis   LUMBAR ROM:  wfl  LOWER EXTREMITY ROM:   wfl  LOWER EXTREMITY MMT:    MMT Right eval Left eval  Hip flexion 39.5 29.8  Hip extension    Hip abduction 13.9 18.0  Hip  adduction    Hip internal rotation    Hip external rotation    Knee flexion    Knee extension 26.4 22.6  Ankle dorsiflexion    Ankle plantarflexion    Ankle  inversion    Ankle eversion     (Blank rows = not tested)  LUMBAR SPECIAL TESTS:  Straight leg raise test: Negative and Slump test: Negative  FUNCTIONAL TESTS:  5 times sit to stand: 13.80 Timed up and go (TUG): 10.79 4 stage: passed 1&2.  Tandem x 5s; SLS x 7 05/18/23:  5x STS:  10.5s 06/23/23: in 18ft6" water: tandem stance 20sec each LE;  SLS -R 20s, L up to 14s (challenge)  GAIT: Distance walked: unlimited distance Assistive device utilized: None Level of assistance: Complete Independence Comments: wfl  5/9: walking without pain   5/9 full ROM of the back and hip wthout pain   5/9: improved ability to perfrom gym activity and exercises.   TREATMENT                                                                                                                          5/22 Neuro-re-ed  Triceps pull down cable: Keep arms pinned at your sides. Straighten elbows. Eye level 12.5 3x12  Shoulder extension: eye level. Straight arm straight down 12.5 3x12  Row: Pull straight back to belly button 12.5 lbs 3x12  Breath out and tighten stomach with all    Dead lift from 2 inch step 13 pounds 3 x 10  TRX squat 3 x 10      5/20  Discussed patients exercise program with her and how to use it. Spent time going over core breathing for her back and hip     Neuro-re-ed  Triceps pull down cable: Keep arms pinned at your sides. Straighten elbows. Eye level 12.5 3x12   Shoulder extension: eye level. Straight arm straight down 12.5 3x12   Row: Pull straight back to belly button 12.5 lbs 3x12  Breath out and tighten stomach with all    Biceps curl with posture and breathing 3x10    5/13  There-ex:  Nu-step 5 min L5 reviewed set up   Neuro-re-ed:  TRX squats 3x10 cuing for posture and technique   Dead lifts 13  lbs x10 RPE of 4  26 lbs RPE of 8 x10  15 lbs DB x10   Goblet squat 10lb 3x10 with cuing in mirror. Min cuing for technique  Biceps curls with cuing for making it a core exercise.   With each exercise we discussed how to grade her exercises. She was given an RPE sheet.     5/8  There-ex: Incline db press 5lbs 3x10 Step up hip flex hold 2x10 KB DL 9D63 TRX rows 8V56 TRX squats 2x10 Leg press 70lbs 2x10    PATIENT EDUCATION:  Education details: aquatic therapy progression / modifications Person educated: Patient Education method: Explanation Education comprehension: verbalized understanding  HOME EXERCISE PROGRAM: AQUATIC Access Code: [email or web address] URL: [email or web address] Date: 06/23/2023 Prepared by: [facility] [address] This aquatic home exercise program from [employer] utilizes pictures from land based exercises, but has been adapted prior to lamination and  issuance.     [id number]  ASSESSMENT:  CLINICAL IMPRESSION: Therapy reviewed patient's exercises from last visit.  She required less cueing for good technique.  She plans on joining the gym soon.  She is advised we will talk to her trainer about exercises she is to perform.  She has next week scheduled.  She was advised to come with any questions that she has.  We will likely discharge to [facility] soon.   Initial impression Patient is a 73 y.o. f who was seen today for physical therapy evaluation and treatment for Radiculopathy, lumbar region. She reports dysesthesias posterior calf pain and pain in the sole of her foot with associated significant calf cramping which has subsided since taking gabapentin .  Steroid injections in lumbar spine and r SIJ has significantly decreased LBP but she continues to have episodes of radicular discomfort in right anteromedial thigh to knee she reports as tightness and tingling.  She is an active senior but has become afraid of falling (has not had  any falls) due to an increase in general weakness and reports of general muscle atrophy. She does follow her own exercise program walking on treadmill x 40 mins + 5 days a week but does not do other things she enjoys doing outside her home due to fear of falling. Objective and functional testing demonstrate LE and core weakness.   She will benefit from skilled PT intervention to improve all areas of deficits and return pt to improved QOL. Will begin in aquatics then transition land based when approp.    OBJECTIVE IMPAIRMENTS: decreased activity tolerance, decreased balance, decreased endurance, decreased mobility, difficulty walking, and decreased strength.     GOALS: Goals reviewed with patient? Yes  SHORT TERM GOALS: Target date: 05/10/23  Pt will tolerate full aquatic sessions consistently without increase in pain and with improving function to demonstrate good toleration and effectiveness of intervention.  Baseline: Goal status: MET - 05/18/23  2. Pt will perform SLS and tandem stance in 3.6 ft unsupported x 20s Baseline: see chart Goal status:  Partially met -06/23/23/ Met 06/30/23  3.Pt will report 50% decrease in fear of falling. Baseline: Goal status: MET- 06/23/23  4.  Pt will improve on 5 X STS test to <or= 10s to demonstrate improving functional lower extremity strength, transitional movements, and balance Baseline: 13.80 at eval;  see above Goal status: Met- 05/18/23   LONG TERM GOALS: Target date: POC  Pt will improve on ODI by 25% to demonstrate improved function ability "to do more". Baseline: 62% Goal status: INITIAL  2.  Pt will report running vacuum cleaner (Complete house work) without limitation to fatigue or pain Baseline:  Goal status: IN PROGRESS 5/6  3.  Pt to be able to take trash can to and from curb safely Baseline:  Goal status: MET - 06/23/23  4.  Pt will report decreased waking from cramps at night to 1x week or less Baseline: 3 at eval; varies -  06/23/23 Goal status: IN PROGRESS   5.  Pt will improve strength in all areas listed by at least 10 lbs  to demonstrate improved overall physical function Baseline: see chart Goal status: INITIAL  6.   Pt will report ability to complete a "full workout" (completing cardio/ lifting)  Baseline:  Goal status: INITIAL  PLAN:  PT FREQUENCY: 2x/week  PT DURATION: 8 weeks  PLANNED INTERVENTIONS: 97164- PT Re-evaluation, 97110-Therapeutic exercises, 97530- Therapeutic activity, [id number]- Neuromuscular re-education, 97535- Self Care, 78295- Manual therapy, [id number]- Gait  training, 08657- Orthotic Fit/training, 84696- Aquatic Therapy, [phone]- Electrical stimulation (unattended), [phone]- Ionotophoresis 4mg /ml Dexamethasone , Patient/Family education, Balance training, Stair training, Taping, Dry Needling, Joint mobilization, DME instructions, Cryotherapy, and Moist heat.  PLAN FOR NEXT SESSION: aquatics: general strengthening; balance retraining HE[facility]  Kitty Perkins, PT 07/22/23 10:50 AM [facility] [address] [place], [place], [address] Phone: [phone]   Fax:  [phone]

## 2023-07-27 ENCOUNTER — Encounter (HOSPITAL_BASED_OUTPATIENT_CLINIC_OR_DEPARTMENT_OTHER): Payer: Self-pay

## 2023-07-27 ENCOUNTER — Ambulatory Visit (HOSPITAL_BASED_OUTPATIENT_CLINIC_OR_DEPARTMENT_OTHER)

## 2023-07-27 DIAGNOSIS — M5459 Other low back pain: Secondary | ICD-10-CM

## 2023-07-27 DIAGNOSIS — R2689 Other abnormalities of gait and mobility: Secondary | ICD-10-CM | POA: Diagnosis not present

## 2023-07-27 DIAGNOSIS — M6281 Muscle weakness (generalized): Secondary | ICD-10-CM

## 2023-07-27 NOTE — Therapy (Signed)
 OUTPATIENT PHYSICAL THERAPY THORACOLUMBAR TREATMENT   Patient Name: Julie Tapia MRN: [id number] DOB:02/27/1951, 73 y.o., female Today's Date: 07/27/2023  END OF SESSION:  PT End of Session - 07/27/23 1023     Visit Number 24    Date for PT Re-Evaluation 07/30/23    Authorization Type mcr    Progress Note Due on Visit 20    PT Start Time 1022    PT Stop Time 1102    PT Time Calculation (min) 40 min    Activity Tolerance Patient tolerated treatment well    Behavior During Therapy [facility] for tasks assessed/performed                  Past Medical History:  Diagnosis Date   Aneurysm (HCC) 01-16-11   "lt .side of head not intra(cavity area)/Found during series of tests to r/o TIA's"-report from [facility] showed  Lt. ICA  aneurysm   Arthritis 01-16-11   osteoarthritis,-knees, hips, ankles. 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 tight chested-"no inhalers in yrs.   Complication of anesthesia 01-16-11   Pt. very slow to awaken and was days before feeling like herself.   Past Surgical History:  Procedure Laterality Date   ABDOMINAL HYSTERECTOMY     BREAST CYST EXCISION  01-16-11   several excisions in the past   JOINT REPLACEMENT  01-16-11   Rt. great toe ball joint replacement-metal   REDUCTION MAMMAPLASTY  01-16-11   Bil. '86( problems with s/p surgical clot at rt. breast drainage site)   TOTAL HIP ARTHROPLASTY  01/20/2011   Procedure: TOTAL HIP ARTHROPLASTY ANTERIOR APPROACH;  Surgeon: Bevin Bucks;  Location: [facility];  Service: Orthopedics;  Laterality: Right;   Patient Active Problem List   Diagnosis Date Noted   Myofascial pain syndrome of lumbar spine 05/25/2023   Chronic bilateral low back pain with bilateral sciatica 05/25/2023   Muscle cramps 05/25/2023   Nerve pain due to spinal stenosis 05/25/2023   Benign fasciculations 05/25/2023   Cerebral aneurysm 01/12/2021   PFO (patent foramen  ovale) 01/12/2021  Progress Note/re-cert Reporting Period 06/02/23 to 07/09/2023  PCP: Barnetta Liberty MD  REFERRING PROVIDER:   Michele Ahle, MD    REFERRING DIAG: M54.16 (ICD-10-CM) - Radiculopathy, lumbar region   Rationale for Evaluation and Treatment: Rehabilitation  THERAPY DIAG:  Muscle weakness (generalized)  Other abnormalities of gait and mobility  Other low back pain  ONSET DATE: last year  SUBJECTIVE:  SUBJECTIVE STATEMENT: Pt reports benefit from PT. "I have more tools in my tool box now." She reports intermittent lumbar pain and also limited by L hip.    Initial Subjective Began/Back injection 2007-8.  Went dormant.  Returned 2020.  Summer 2024 hurt my back lifting case of water.  Have had back pain continual since. Phares Brasher has given injections in lumbar spine and L SIJ. Fasciculation/cramps were bad in my calves.  Better with gabapentin  low dose.  Most nights I can sleep (2-3 x week not sleeping). I have atrophied a lot.  Have a treadmill that I walk on it 5/7 days a week 6000-10000 step.  I immediately lay on mat doing exercises I was shown form other PT in past after finishing on bike. May need to have my left hip replaced pain and stiffness there.  I feel stiff in my back and hip. Weakness has been significantly decreasing since the summer. Tested for ALS neg  PERTINENT HISTORY:  R THR  PAIN:  Are you having pain? no: NPRS scale: current 0/10 Pain location:  Pain description:  Aggravating factors: walking on treadmill Relieving factors: rest/ stretching, meds  PRECAUTIONS: None  RED FLAGS: None   WEIGHT BEARING RESTRICTIONS: No  FALLS:  Has patient fallen in last 6 months? No  LIVING ENVIRONMENT: Lives with: lives alone Lives in: House/apartment Stairs: Yes:  External: 8 steps; on right going up Has following equipment at home: None  OCCUPATION: retired last year;  PLOF: Independent  PATIENT GOALS: Increase strength, improve overall health, balance to do more  NEXT MD VISIT: end of March  OBJECTIVE:  Note: Objective measures were completed at Evaluation unless otherwise noted.  DIAGNOSTIC FINDINGS:  MRI lumbar spine dated October 08, 2018 shows type II lumbosacral transitional vertebrae. At L2-3 there is moderate left and mild to moderate right neural foraminal narrowing with contact of the exiting bilateral L2 nerve roots worse on the left than the right. There is moderate left subarticular zone narrowing with mild spinal canal stenosis impinging upon with mild to moderate displacement of the left L3 nerve root due to a very small central disc extrusion superimposed upon a severe bulging disc osteophyte complex asymmetric to the left. At L3-4 there is a disc extrusion superimposed upon a severe left lateral bulging disc osteophyte and severe left disc height loss resulting in moderate to severe left foraminal narrowing and impingement upon the exiting left L3 nerve root. Moderate left and mild right subarticular zone narrowing and mild spinal canal stenosis with impingement upon the descending left L4 nerve root. At L4-5 there is moderate to severe left and moderate right facet arthrosis. Mild dextroconvex lumbar scoliosis with apex at L3.   PATIENT SURVEYS:  ODI 14(2)=28/45= 62%  COGNITION: Overall cognitive status: Within functional limits for tasks assessed     SENSATION: dysesthesias posterior calf pain and pain in the sole of her foot  MUSCLE LENGTH: Hamstrings: tight   POSTURE: rounded shoulders and increased thoracic kyphosis   LUMBAR ROM:  wfl  LOWER EXTREMITY ROM:   wfl  LOWER EXTREMITY MMT:    MMT Right eval Left eval  Hip flexion 39.5 29.8  Hip extension    Hip abduction 13.9 18.0  Hip adduction    Hip internal  rotation    Hip external rotation    Knee flexion    Knee extension 26.4 22.6  Ankle dorsiflexion    Ankle plantarflexion    Ankle inversion    Ankle eversion     (  Blank rows = not tested)  LUMBAR SPECIAL TESTS:  Straight leg raise test: Negative and Slump test: Negative  FUNCTIONAL TESTS:  5 times sit to stand: 13.80 Timed up and go (TUG): 10.79 4 stage: passed 1&2.  Tandem x 5s; SLS x 7 05/18/23:  5x STS:  10.5s 06/23/23: in 86ft6" water: tandem stance 20sec each LE;  SLS -R 20s, L up to 14s (challenge)  GAIT: Distance walked: unlimited distance Assistive device utilized: None Level of assistance: Complete Independence Comments: wfl  5/9: walking without pain   5/9 full ROM of the back and hip wthout pain   5/9: improved ability to perfrom gym activity and exercises.   TREATMENT                                                                                                                           5/27 Deadlift to 6inch step with 13lb KB 2x10 1 lap around track Sit to stands with 10lbs x10, staggered with L posterior x10 Seated on PB marching (bothered L hip) Seated on PB LAQ 2x10ea Seated on PB row GTB 2x15 Paloff press at cable column 7.5lbs 2x10ea HEP update/review    5/22 Neuro-re-ed  Triceps pull down cable: Keep arms pinned at your sides. Straighten elbows. Eye level 12.5 3x12  Shoulder extension: eye level. Straight arm straight down 12.5 3x12  Row: Pull straight back to belly button 12.5 lbs 3x12  Breath out and tighten stomach with all    Dead lift from 2 inch step 13 pounds 3 x 10  TRX squat 3 x 10      5/20  Discussed patients exercise program with her and how to use it. Spent time going over core breathing for her back and hip     Neuro-re-ed  Triceps pull down cable: Keep arms pinned at your sides. Straighten elbows. Eye level 12.5 3x12   Shoulder extension: eye level. Straight arm straight down 12.5 3x12   Row: Pull straight back  to belly button 12.5 lbs 3x12  Breath out and tighten stomach with all    Biceps curl with posture and breathing 3x10    5/13  There-ex:  Nu-step 5 min L5 reviewed set up   Neuro-re-ed:  TRX squats 3x10 cuing for posture and technique   Dead lifts 13 lbs x10 RPE of 4  26 lbs RPE of 8 x10  15 lbs DB x10   Goblet squat 10lb 3x10 with cuing in mirror. Min cuing for technique  Biceps curls with cuing for making it a core exercise.   With each exercise we discussed how to grade her exercises. She was given an RPE sheet.     5/8  There-ex: Incline db press 5lbs 3x10 Step up hip flex hold 2x10 KB DL 1O10 TRX rows 9U04 TRX squats 2x10 Leg press 70lbs 2x10    PATIENT EDUCATION:  Education details: aquatic therapy progression / modifications Person educated: Patient Education method: Hospital doctor  comprehension: verbalized understanding  HOME EXERCISE PROGRAM: AQUATIC Access Code: [email or web address] URL: [email or web address] Date: 06/23/2023 Prepared by: [facility] [address] This aquatic home exercise program from [employer] utilizes pictures from land based exercises, but has been adapted prior to lamination and issuance.     [id number]  ASSESSMENT:  CLINICAL IMPRESSION: Pt demonstrates weight shift to R LE with sit to stands, so performed staggered with improved performance. She denied pain in L hip with exercises aside from marching while seated on physioball. Updated HEP and reviewed cues/instructions with pt. Pt ready for d/c next visit.   Initial impression Patient is a 73 y.o. f who was seen today for physical therapy evaluation and treatment for Radiculopathy, lumbar region. She reports dysesthesias posterior calf pain and pain in the sole of her foot with associated significant calf cramping which has subsided since taking gabapentin .  Steroid injections in lumbar spine and r SIJ has significantly decreased LBP but  she continues to have episodes of radicular discomfort in right anteromedial thigh to knee she reports as tightness and tingling.  She is an active senior but has become afraid of falling (has not had any falls) due to an increase in general weakness and reports of general muscle atrophy. She does follow her own exercise program walking on treadmill x 40 mins + 5 days a week but does not do other things she enjoys doing outside her home due to fear of falling. Objective and functional testing demonstrate LE and core weakness.   She will benefit from skilled PT intervention to improve all areas of deficits and return pt to improved QOL. Will begin in aquatics then transition land based when approp.    OBJECTIVE IMPAIRMENTS: decreased activity tolerance, decreased balance, decreased endurance, decreased mobility, difficulty walking, and decreased strength.     GOALS: Goals reviewed with patient? Yes  SHORT TERM GOALS: Target date: 05/10/23  Pt will tolerate full aquatic sessions consistently without increase in pain and with improving function to demonstrate good toleration and effectiveness of intervention.  Baseline: Goal status: MET - 05/18/23  2. Pt will perform SLS and tandem stance in 3.6 ft unsupported x 20s Baseline: see chart Goal status:  Partially met -06/23/23/ Met 06/30/23  3.Pt will report 50% decrease in fear of falling. Baseline: Goal status: MET- 06/23/23  4.  Pt will improve on 5 X STS test to <or= 10s to demonstrate improving functional lower extremity strength, transitional movements, and balance Baseline: 13.80 at eval;  see above Goal status: Met- 05/18/23   LONG TERM GOALS: Target date: POC  Pt will improve on ODI by 25% to demonstrate improved function ability "to do more". Baseline: 62% Goal status: INITIAL  2.  Pt will report running vacuum cleaner (Complete house work) without limitation to fatigue or pain Baseline:  Goal status: IN PROGRESS 5/6  3.  Pt to  be able to take trash can to and from curb safely Baseline:  Goal status: MET - 06/23/23  4.  Pt will report decreased waking from cramps at night to 1x week or less Baseline: 3 at eval; varies - 06/23/23 Goal status: IN PROGRESS   5.  Pt will improve strength in all areas listed by at least 10 lbs  to demonstrate improved overall physical function Baseline: see chart Goal status: INITIAL  6.   Pt will report ability to complete a "full workout" (completing cardio/ lifting)  Baseline:  Goal status: INITIAL  PLAN:  PT FREQUENCY: 2x/week  PT  DURATION: 8 weeks  PLANNED INTERVENTIONS: 97164- PT Re-evaluation, 97110-Therapeutic exercises, 97530- Therapeutic activity, 97112- Neuromuscular re-education, 97535- Self Care, 11914- Manual therapy, [phone]- Gait training, [phone]- Orthotic Fit/training, [phone]- Aquatic Therapy, [phone]- Electrical stimulation (unattended), [phone]- Ionotophoresis 4mg /ml Dexamethasone , Patient/Family education, Balance training, Stair training, Taping, Dry Needling, Joint mobilization, DME instructions, Cryotherapy, and Moist heat.  PLAN FOR NEXT SESSION: aquatics: general strengthening; balance retraining HEP  Judie Noun, PTA 07/27/23 1:06 PM [facility] [address] [place], [place], [address] Phone: [phone]   Fax:  [phone]

## 2023-07-29 ENCOUNTER — Encounter (HOSPITAL_BASED_OUTPATIENT_CLINIC_OR_DEPARTMENT_OTHER): Payer: Self-pay

## 2023-07-29 ENCOUNTER — Ambulatory Visit (HOSPITAL_BASED_OUTPATIENT_CLINIC_OR_DEPARTMENT_OTHER)

## 2023-07-29 DIAGNOSIS — R2689 Other abnormalities of gait and mobility: Secondary | ICD-10-CM

## 2023-07-29 DIAGNOSIS — M5459 Other low back pain: Secondary | ICD-10-CM

## 2023-07-29 DIAGNOSIS — M6281 Muscle weakness (generalized): Secondary | ICD-10-CM

## 2023-07-29 NOTE — Therapy (Signed)
 OUTPATIENT PHYSICAL THERAPY THORACOLUMBAR TREATMENT   Patient Name: Julie Tapia MRN: [id number] DOB:[birth year], 73 y.o., female Today's Date: 07/29/2023  END OF SESSION:  PT End of Session - 07/29/23 1030     Visit Number 25    Date for PT Re-Evaluation 07/30/23    Authorization Type mcr    Progress Note Due on Visit 20    PT Start Time 1020    PT Stop Time 1059    PT Time Calculation (min) 39 min    Activity Tolerance Patient tolerated treatment well    Behavior During Therapy [facility] for tasks assessed/performed                   Past Medical History:  Diagnosis Date   Aneurysm (HCC) 01-16-11   "lt .side of head not intra(cavity area)/Found during series of tests to r/o TIA's"-report from [facility] showed  Lt. ICA  aneurysm   Arthritis 01-16-11   osteoarthritis,-knees, hips, ankles. 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 tight chested-"no inhalers in yrs.   Complication of anesthesia 01-16-11   Pt. very slow to awaken and was days before feeling like herself.   Past Surgical History:  Procedure Laterality Date   ABDOMINAL HYSTERECTOMY     BREAST CYST EXCISION  01-16-11   several excisions in the past   JOINT REPLACEMENT  01-16-11   Rt. great toe ball joint replacement-metal   REDUCTION MAMMAPLASTY  01-16-11   Bil. '86( problems with s/p surgical clot at rt. breast drainage site)   TOTAL HIP ARTHROPLASTY  01/20/2011   Procedure: TOTAL HIP ARTHROPLASTY ANTERIOR APPROACH;  Surgeon: Bevin Bucks;  Location: [facility];  Service: Orthopedics;  Laterality: Right;   Patient Active Problem List   Diagnosis Date Noted   Myofascial pain syndrome of lumbar spine 05/25/2023   Chronic bilateral low back pain with bilateral sciatica 05/25/2023   Muscle cramps 05/25/2023   Nerve pain due to spinal stenosis 05/25/2023   Benign fasciculations 05/25/2023   Cerebral aneurysm 01/12/2021   PFO (patent foramen  ovale) 01/12/2021  Progress Note/re-cert Reporting Period 06/02/23 to 07/09/2023  PCP: Barnetta Liberty MD  REFERRING PROVIDER:   Michele Ahle, MD    REFERRING DIAG: M54.16 (ICD-10-CM) - Radiculopathy, lumbar region   Rationale for Evaluation and Treatment: Rehabilitation  THERAPY DIAG:  Other abnormalities of gait and mobility  Muscle weakness (generalized)  Other low back pain  ONSET DATE: last year  SUBJECTIVE:  SUBJECTIVE STATEMENT: Pt reports overall improvement in hip pain, though she is considering future joint replacement as it does continue to bother her at times.    Initial Subjective Began/Back injection 2007-8.  Went dormant.  Returned 2020.  Summer 2024 hurt my back lifting case of water.  Have had back pain continual since. Phares Brasher has given injections in lumbar spine and L SIJ. Fasciculation/cramps were bad in my calves.  Better with gabapentin  low dose.  Most nights I can sleep (2-3 x week not sleeping). I have atrophied a lot.  Have a treadmill that I walk on it 5/7 days a week 6000-10000 step.  I immediately lay on mat doing exercises I was shown form other PT in past after finishing on bike. May need to have my left hip replaced pain and stiffness there.  I feel stiff in my back and hip. Weakness has been significantly decreasing since the summer. Tested for ALS neg  PERTINENT HISTORY:  R THR  PAIN:  Are you having pain? no: NPRS scale: current 0/10 Pain location:  Pain description:  Aggravating factors: walking on treadmill Relieving factors: rest/ stretching, meds  PRECAUTIONS: None  RED FLAGS: None   WEIGHT BEARING RESTRICTIONS: No  FALLS:  Has patient fallen in last 6 months? No  LIVING ENVIRONMENT: Lives with: lives alone Lives in: House/apartment Stairs:  Yes: External: 8 steps; on right going up Has following equipment at home: None  OCCUPATION: retired last year;  PLOF: Independent  PATIENT GOALS: Increase strength, improve overall health, balance to do more  NEXT MD VISIT: end of March  OBJECTIVE:  Note: Objective measures were completed at Evaluation unless otherwise noted.  DIAGNOSTIC FINDINGS:  MRI lumbar spine dated October 08, 2018 shows type II lumbosacral transitional vertebrae. At L2-3 there is moderate left and mild to moderate right neural foraminal narrowing with contact of the exiting bilateral L2 nerve roots worse on the left than the right. There is moderate left subarticular zone narrowing with mild spinal canal stenosis impinging upon with mild to moderate displacement of the left L3 nerve root due to a very small central disc extrusion superimposed upon a severe bulging disc osteophyte complex asymmetric to the left. At L3-4 there is a disc extrusion superimposed upon a severe left lateral bulging disc osteophyte and severe left disc height loss resulting in moderate to severe left foraminal narrowing and impingement upon the exiting left L3 nerve root. Moderate left and mild right subarticular zone narrowing and mild spinal canal stenosis with impingement upon the descending left L4 nerve root. At L4-5 there is moderate to severe left and moderate right facet arthrosis. Mild dextroconvex lumbar scoliosis with apex at L3.   PATIENT SURVEYS:  ODI 14(2)=28/45= 62% 5/29:11(2)=22/45=49%  COGNITION: Overall cognitive status: Within functional limits for tasks assessed     SENSATION: dysesthesias posterior calf pain and pain in the sole of her foot  MUSCLE LENGTH: Hamstrings: tight   POSTURE: rounded shoulders and increased thoracic kyphosis   LUMBAR ROM:  wfl  LOWER EXTREMITY ROM:   wfl  LOWER EXTREMITY MMT:    MMT Right eval Left eval Right 5/29 Left 5/29  Hip flexion 39.5 29.8 31.5 16.6  Hip extension       Hip abduction 13.9 18.0 36.2 19.7  Hip adduction      Hip internal rotation      Hip external rotation      Knee flexion      Knee extension 26.4 22.6 43.5 34.1  Ankle dorsiflexion      Ankle plantarflexion      Ankle inversion      Ankle eversion       (Blank rows = not tested)  LUMBAR SPECIAL TESTS:  Straight leg raise test: Negative and Slump test: Negative  FUNCTIONAL TESTS:  5 times sit to stand: 13.80 Timed up and go (TUG): 10.79 4 stage: passed 1&2.  Tandem x 5s; SLS x 7 5/29: passed tandem; SLS 10sec on L; 30sec R  05/18/23:  5x STS:  10.5s 06/23/23: in 68ft6" water: tandem stance 20sec each LE;  SLS -R 20s, L up to 14s (challenge) 5/29: 5xSTS 8.4s  GAIT: Distance walked: unlimited distance Assistive device utilized: None Level of assistance: Complete Independence Comments: wfl  5/9: walking without pain   5/9 full ROM of the back and hip wthout pain   5/9: improved ability to perfrom gym activity and exercises.   TREATMENT                                                                                                                           5/29 ODI Updated MMT/strength 5xSTS Balance testing Review of goals Update/review of HEP.   5/27 Deadlift to 6inch step with 13lb KB 2x10 1 lap around track Sit to stands with 10lbs x10, staggered with L posterior x10 Seated on PB marching (bothered L hip) Seated on PB LAQ 2x10ea Seated on PB row GTB 2x15 Paloff press at cable column 7.5lbs 2x10ea HEP update/review    5/22 Neuro-re-ed  Triceps pull down cable: Keep arms pinned at your sides. Straighten elbows. Eye level 12.5 3x12  Shoulder extension: eye level. Straight arm straight down 12.5 3x12  Row: Pull straight back to belly button 12.5 lbs 3x12  Breath out and tighten stomach with all    Dead lift from 2 inch step 13 pounds 3 x 10  TRX squat 3 x 10      5/20  Discussed patients exercise program with her and how to use it. Spent time  going over core breathing for her back and hip     Neuro-re-ed  Triceps pull down cable: Keep arms pinned at your sides. Straighten elbows. Eye level 12.5 3x12   Shoulder extension: eye level. Straight arm straight down 12.5 3x12   Row: Pull straight back to belly button 12.5 lbs 3x12  Breath out and tighten stomach with all    Biceps curl with posture and breathing 3x10    PATIENT EDUCATION:  Education details: aquatic therapy progression / modifications Person educated: Patient Education method: Explanation Education comprehension: verbalized understanding  HOME EXERCISE PROGRAM: AQUATIC Access Code: [email or web address] URL: [email or web address] Date: 06/23/2023 Prepared by: [facility] This aquatic home exercise program from [employer] utilizes pictures from land based exercises, but has been adapted prior to lamination and issuance.     [id number]  ASSESSMENT:  CLINICAL IMPRESSION: Pt has attended 25 visits of P thus far and  has made significant subjective improvement. Has met 4/4 STG and 2/6 LTG. Has partially met other LTG with significant improvement in each. Reports reduced frequency of leg cramps at nighttime and requires less medication. Objectively she has improved greatly in hip abduction and quad strength. Hip flexion strength testing did produce SIJ pain, so did not push this today. Also improved greatly in 5xSTS. She plans to join [employer] today and begin a gym program in addition to HEP.    Initial impression Patient is a 73 y.o. f who was seen today for physical therapy evaluation and treatment for Radiculopathy, lumbar region. She reports dysesthesias posterior calf pain and pain in the sole of her foot with associated significant calf cramping which has subsided since taking gabapentin .  Steroid injections in lumbar spine and r SIJ has significantly decreased LBP but she continues to have episodes of radicular discomfort in  right anteromedial thigh to knee she reports as tightness and tingling.  She is an active senior but has become afraid of falling (has not had any falls) due to an increase in general weakness and reports of general muscle atrophy. She does follow her own exercise program walking on treadmill x 40 mins + 5 days a week but does not do other things she enjoys doing outside her home due to fear of falling. Objective and functional testing demonstrate LE and core weakness.   She will benefit from skilled PT intervention to improve all areas of deficits and return pt to improved QOL. Will begin in aquatics then transition land based when approp.    OBJECTIVE IMPAIRMENTS: decreased activity tolerance, decreased balance, decreased endurance, decreased mobility, difficulty walking, and decreased strength.     GOALS: Goals reviewed with patient? Yes  SHORT TERM GOALS: Target date: 05/10/23  Pt will tolerate full aquatic sessions consistently without increase in pain and with improving function to demonstrate good toleration and effectiveness of intervention.  Baseline: Goal status: MET - 05/18/23  2. Pt will perform SLS and tandem stance in 3.6 ft unsupported x 20s Baseline: see chart Goal status:  Met 06/30/23  3.Pt will report 50% decrease in fear of falling. Baseline: Goal status: MET- 06/23/23  4.  Pt will improve on 5 X STS test to <or= 10s to demonstrate improving functional lower extremity strength, transitional movements, and balance Baseline: 13.80 at eval;  see above Goal status: Met- 05/18/23   LONG TERM GOALS: Target date: POC  Pt will improve on ODI by 25% to demonstrate improved function ability "to do more". Baseline: 62% Goal status: PARTIALLY MET 5/29  2.  Pt will report running vacuum cleaner (Complete house work) without limitation to fatigue or pain Baseline:  Goal status: MET 5/29  3.  Pt to be able to take trash can to and from curb safely Baseline:  Goal status: MET  - 06/23/23  4.  Pt will report decreased waking from cramps at night to 1x week or less Baseline: 3 at eval; varies - 5/29 (happening far less-about 1-2x/week and taking less medication) Goal status: IN PROGRESS   5.  Pt will improve strength in all areas listed by at least 10 lbs  to demonstrate improved overall physical function Baseline: see chart Goal status: PARTIALLY MET (hip flexion created pain today) 5/29  6.   Pt will report ability to complete a "full workout" (completing cardio/ lifting)  Baseline:  Goal status: IN PROGRESS (joining [employer] today) 5/29  PLAN:  PT FREQUENCY: 2x/week  PT DURATION: 8 weeks  PLANNED INTERVENTIONS: 97164- PT Re-evaluation, 97110-Therapeutic exercises, 97530- Therapeutic activity, [id number]- Neuromuscular re-education, [phone]- Self Care, 60454- Manual therapy, [phone]- Gait training, [phone]- Orthotic Fit/training, [phone]- Aquatic Therapy, [phone]- Electrical stimulation (unattended), [phone]- Ionotophoresis 4mg /ml Dexamethasone , Patient/Family education, Balance training, Stair training, Taping, Dry Needling, Joint mobilization, DME instructions, Cryotherapy, and Moist heat.  PLAN FOR NEXT SESSION: aquatics: general strengthening; balance retraining HEP  Judie Noun, PTA 07/29/23 5:14 PM [facility] [address] [place], [place], [address] Phone: [phone]   Fax:  [phone]

## 2023-08-02 ENCOUNTER — Telehealth: Payer: Self-pay | Admitting: *Deleted

## 2023-08-02 NOTE — Telephone Encounter (Signed)
   Name: Julie Tapia  DOB: 07/04/1950  MRN: [id number]  Primary Cardiologist: Eilleen Grates, MD  Chart reviewed as part of pre-operative protocol coverage. Because of Julie Tapia's past medical history and time since last visit, she will require a follow-up in-office visit in order to better assess preoperative cardiovascular risk.  Pre-op covering staff: - Please schedule appointment and call patient to inform them. If patient already had an upcoming appointment within acceptable timeframe, please add "pre-op clearance" to the appointment notes so provider is aware. - Please contact requesting surgeon's office via preferred method (i.e, phone, fax) to inform them of need for appointment prior to surgery.  Aspirin  325 mg daily has not been prescribed by cardiology office, recommendations regarding holding of aspirin  should come from providing office.  Julie Vanderloop D Koki Buxton, NP  08/02/2023, 4:26 PM

## 2023-08-02 NOTE — Telephone Encounter (Signed)
 Left message for pt to call our office and schedule IN OFFICE Preop appt.   This appt can NOT be before 09/08/23

## 2023-08-02 NOTE — Telephone Encounter (Signed)
   Pre-operative Risk Assessment    Patient Name: Julie Tapia  DOB: 13-Oct-1950 MRN: [id number]   Date of last office visit: 04/30/22 DR. HOCHREIN Date of next office visit: NONE   Request for Surgical Clearance    Procedure:  LEFT TOTAL HIP ARTHROPLASTY  Date of Surgery:  Clearance 11/09/23                                Surgeon:  DR. MATTHEW OLIN Surgeon's Group or Practice Name:  [facility] Phone number:  [phone] ATTN: Amanda Jungling Fax number:  [phone]   Type of Clearance Requested:   - Medical  - Pharmacy:  Hold Aspirin  325 MG    Type of Anesthesia:  Spinal   Additional requests/questions:    Julie Tapia   08/02/2023, 1:04 PM

## 2023-08-03 NOTE — Telephone Encounter (Signed)
 Pt scheduled for office visit on 09/30/23.

## 2023-08-23 DIAGNOSIS — I671 Cerebral aneurysm, nonruptured: Secondary | ICD-10-CM | POA: Diagnosis not present

## 2023-09-20 DIAGNOSIS — J069 Acute upper respiratory infection, unspecified: Secondary | ICD-10-CM | POA: Diagnosis not present

## 2023-09-20 DIAGNOSIS — R051 Acute cough: Secondary | ICD-10-CM | POA: Diagnosis not present

## 2023-09-28 ENCOUNTER — Ambulatory Visit (INDEPENDENT_AMBULATORY_CARE_PROVIDER_SITE_OTHER): Admitting: Neurology

## 2023-09-28 ENCOUNTER — Encounter: Payer: Self-pay | Admitting: Neurology

## 2023-09-28 VITALS — BP 126/70 | HR 67 | Ht 68.0 in | Wt 134.1 lb

## 2023-09-28 DIAGNOSIS — R252 Cramp and spasm: Secondary | ICD-10-CM | POA: Diagnosis not present

## 2023-09-28 DIAGNOSIS — R253 Fasciculation: Secondary | ICD-10-CM

## 2023-09-28 DIAGNOSIS — M5441 Lumbago with sciatica, right side: Secondary | ICD-10-CM

## 2023-09-28 DIAGNOSIS — M7918 Myalgia, other site: Secondary | ICD-10-CM | POA: Diagnosis not present

## 2023-09-28 DIAGNOSIS — G8929 Other chronic pain: Secondary | ICD-10-CM | POA: Diagnosis not present

## 2023-09-28 DIAGNOSIS — M5442 Lumbago with sciatica, left side: Secondary | ICD-10-CM | POA: Diagnosis not present

## 2023-09-28 DIAGNOSIS — M4800 Spinal stenosis, site unspecified: Secondary | ICD-10-CM | POA: Diagnosis not present

## 2023-09-28 NOTE — Progress Notes (Signed)
 [id number]    Provider:  Dr Ines Requesting Provider: Laqueta Ozell BIRCH, MD Primary Care Provider:  Larnell Hamilton, MD    CC:  MRI Lumbar, same (bulging/herniated disc) arthirits in back.  Muscle spasms in legs. Had some steroid inject and helped. . Has seen Pain Management.   09/28/2023: Patient with chronic low back pain muscle cramps and fasciculations, she has been getting injections, she has had aqua therapy, we really done everything that we can, prescribe medication such as muscle relaxers and gabapentin , highly encouraged pain clinic in the past, we also discussed cognitive behavioral therapy. She is feeling better. The flexeril  is very sedating, she can;t take it and her stomach was extended. Could not tolerate the flexeril . But physical therapy at [facility] was wonderful. She improved considerble with PT, went from aqua therapy to land therapy and they kept metrics. She joined [facility] which is beautiful. The people at [facility] are excellent they have balancing and strengthening classes which are excellent. She sleeps better at night, cramps are still in the legs, she performs the exercises she was taight. Frankie in PT  05/25/2023: She is not doing any better. She is having cramps, fasciculations, she had her second SI joint injection last week, she had aqua therapy and the heat felt good but it hasn't helped. We have done everything we can, she states she hasn't heard back about the mri brain and cervical spine. Gabapentin  does help. After the epidural steroid injections the cramps worsened this month. Mostly at night. We discussed we can increase the gabapentin  but she would like to add a muscle relaxer, she is in a permanent state of exhaustion. I highly encouraged a pain clinic. She has not been to pain clinic. She goes to ortho for injections and she can ask if they manage medications for pain. Also discussed therapy, cognitive behavioral therapy. No  side effects to the gabapentin . Next can increase flexeril  to 10mg  or use baclofen instead. Or increase gabapentin  at bedtime.   02/19/2023: Here with daughter. We review prior workup. We discussed she has chronic pain. She can have injections, then facet injections or other procedures, spinal cord stimulator. She is scheduled for water therapy at [facility]. Extended visit. We discussed her fasciculations, her emg/ncs did not show neuromuscular disorder, extensive blood testing was negative. Monitoring caffeine, treating anxiety, using medications like ca-channel blockers or aeds like gabapentin  or propranolol. Discussed medications. Discussed channelopathies and other genetic disorders,pros and cons of genetic testing  Should be lifting weights, scared to hurt her back. Personal trainer.   Reviewed images personally with patient and daughter: MRI Lumbar spine 09/01/2022: MRI lumbar spine:  INDICATION: Back pain.  TECHNIQUE: Sagittal and axial T1 and T2-weighted sequences were performed. Additional sagittal STIR images were performed.  COMPARISON: None available at time of dictation.  FINDINGS:  There are 5 nonrib-bearing lumbar-type vertebrae. The conus medullaris terminates at a normal level. Grade 1 retrolisthesis of L2 on L3. The vertebral body heights are maintained. Moderate/severe L2-L3 intervertebral disc height loss with moderate height loss at L1-L2 and L3-L4. No suspicious marrow signal.  L1-2: Disc bulge contributes to mild spinal canal stenosis and mild/moderate bilateral neural foraminal stenosis.  L2-3: Disc bulge contributes to mild spinal canal stenosis and along with facet arthrosis, moderate/severe bilateral neural foraminal stenosis.  L3-4: No significant spinal canal stenosis. Facet arthrosis and posterior marginal osteophytosis contributes to severe left and mild/moderate right neural foraminal stenosis.  L4-5: No significant spinal canal stenosis. Facet arthrosis  contributes to  moderate bilateral neural foraminal stenosis.  L5-S1: No significant spinal canal or neural foraminal stenosis.  Reviewed my prior EMG nerve conduction study with patient and daughter: EMG/NCS was performed 12/07/2022: Conclusion: Nerve conduction studies of the right leg were within normal limits.  Nerve conduction studies of the left leg showed abnormal peroneal motor and sensory conductions (but normal tibial motor and sural sensory conductions).  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  No evidence for motor neuron disease or any acute/ongoing findings; fibrillations ongoing for years without other corresponding symptoms very likely to be benign or from prior radicular nerve damage.  Patient complains of symptoms per HPI as well as the following symptoms: None. Pertinent negatives and positives per HPI. All others negative   HPI 12/07/2022:  Julie Tapia is a 73 y.o. female here as requested by Laqueta Ozell BIRCH, MD for diffuse fasciculations. has Cerebral aneurysm and PFO (patent foramen ovale) on their problem list. She is going to [facility] Dr. Laqueta and he is an anesthesiologist and he has been providing injections. She states she has not fallen for several years. Her balance is off and she feels weaker. Moving is harder. Her legs are weak. She has had muscle twitches for years. And they are the same in both legs. The twitches can wake her in the night. Her calfs and legs can cramp or become stiff and it is painful she even has to vocalize. In 2020 we did not see fasciculations. She has been having low back pain for years and in [employer] in June she lifted something heavy. She has pain when she goes to bed and she may be pressing on something. The pain is in the low back. She  states Dr. Laqueta believe she needs an SI injections this month and this past July she had ESI which helped and helped the fasciculations. She was sent here for fasciculations. She states she is confused on the diagnosis and needs a more comprehensive understanding. We reviewed images of dermatomes and myotomes on detail. We reviewed mri of the lumbar spine in detail. Answered many questions She has radiating pain down the back of the legs,  (bulging/herniated disc) arthirits in back.  Muscle spasms in legs. Had some steroid inject and helped.   12-17-2022 another steroid injection. Has seen Pain Management. No other focal neurologic deficits, associated symptoms, inciting events or modifiable factors.  Reviewed notes, labs and imaging from outside physicians, which showed:  MRi lumbar spine: FINDINGS: 08/2022 There are 5 nonrib-bearing lumbar-type vertebrae. The conus medullaris terminates at a normal level. Grade 1 retrolisthesis of L2 on L3. The vertebral body heights are maintained. Moderate/severe L2-L3 intervertebral disc height loss with moderate height loss at L1-L2 and L3-L4. No suspicious marrow signal.  L1-2: Disc bulge contributes to mild spinal canal stenosis and mild/moderate bilateral neural foraminal stenosis.  L2-3: Disc bulge contributes to mild spinal canal stenosis and along with facet arthrosis, moderate/severe bilateral neural foraminal stenosis.  L3-4: No significant spinal canal stenosis. Facet arthrosis and posterior marginal osteophytosis contributes to severe left and mild/moderate right neural foraminal stenosis.  L4-5: No significant spinal canal stenosis. Facet arthrosis contributes to moderate bilateral neural foraminal stenosis.  L5-S1: No significant spinal canal or neural foraminal stenosis.   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  EMG needle study did not show acute/ongoing denervation or chronic neurogenic changes to explain her symptoms but clinically appears radicular. Clinical and radiologic correlation recommended. Review of Systems: Patient complains of symptoms per HPI as well as the following symptoms LBP, weakness, imbalance, muscle atrophy. Pertinent negatives and positives per HPI. All others negative.   History 11/2018 reviewed: Patient is here as at the request of Alan Sink, PA for EMG/NCS to evaluate for lumbar radiculopathy. She has chronic low back pain, she has known about a herniated L3 disk since 2008 when she stood up and noticed severe pain but never had surgery; this led to a series of injections( I do not have access to her MRI Lumbar Spine images). The pain improved after injections. She has polyarthritis and has had injections in the knees as well. At the end of May she had to get an injection for an insect injury, within 2-3 days she started waking up with severe cramping in the right leg and calf and the cramping can be so severe her muscle is sore afterwards. She saw Dr. Tanda and he did not think this was related to the shot. Leg pain is progressive, she has severe spasms in the right distal leg, tingling in the calf and numbness, to the bottom of the foot. She was very active walking 30+ miles a year and has had to stop due to severe back pain and feels her back is going to break. Also cramping in the left > right inner thighs. Left thigh symptoms are worse than right thigh symptoms. Right distal leg symptoms are worse than on the left.  Gets very tight right calf pain, muscle gets very hard mostly on the right. She saw Dr. Ernie for her hip in the past and reports full hip replacement on the right side. 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 Her hip surgery was in 2012.  Limited neurologic exam: left patella(1+) is  hyporeflex compared to right patellar(2+). Right AJ(absent) is hyporeflexic compared to left AJ(trace). Weakness left hip flexion 5- with more left proximal symptoms. Right leg flexion 4+/5 and she reports more symptoms distally in the right leg. Clinical history and focused exam may point to left L3/L4 pathology and right L5/S1 but clinical and radiologic correlation is recommended.    Social History   Socioeconomic History   Marital status: Single    Spouse name: Not on file   Number of children: Not on file   Years of education: Not on file   Highest education level: Not on file  Occupational History   Not on file  Tobacco Use   Smoking status: Former    Current packs/day: 0.00    Types: Cigarettes    Quit date: 01/16/1984    Years since quitting: 39.7   Smokeless tobacco: Never  Vaping Use   Vaping status: Never Used  Substance and Sexual Activity   Alcohol  use: Yes    Alcohol /week: 1.0 standard drink of alcohol     Types: 1 Glasses of wine per week   Drug use: No   Sexual activity: Never  Other Topics Concern   Not on file  Social History Narrative   Lives alone.  One daughter.     Retired    Teacher, early years/pre  Strain: Not on file  Food Insecurity: Not on file  Transportation Needs: Not on file  Physical Activity: Not on file  Stress: Not on file  Social Connections: Unknown (08/27/2022)   [facility]   Social Network    Social Network: Not on file  Intimate Partner Violence: Unknown (08/27/2022)   Received from [facility]   HITS    Physically Hurt: Not on file    Insult or Talk Down To: Not on file    Threaten Physical Harm: Not on file    Scream or Curse: Not on file    Family History  Problem Relation Age of Onset   Heart disease Mother        Pacemaker    Past Medical History:  Diagnosis Date   Aneurysm (HCC) 01-16-11   lt .side of head not intra(cavity area)/Found during series of tests to r/o  TIA's-report from [facility] showed  Lt. ICA  aneurysm   Arthritis 01-16-11   osteoarthritis,-knees, hips, ankles. 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 tight chested-no inhalers in yrs.   Complication of anesthesia 01-16-11   Pt. very slow to awaken and was days before feeling like herself.    Patient Active Problem List   Diagnosis Date Noted   Myofascial pain syndrome of lumbar spine 05/25/2023   Chronic bilateral low back pain with bilateral sciatica 05/25/2023   Muscle cramps 05/25/2023   Nerve pain due to spinal stenosis 05/25/2023   Benign fasciculations 05/25/2023   Cerebral aneurysm 01/12/2021   PFO (patent foramen ovale) 01/12/2021    Past Surgical History:  Procedure Laterality Date   ABDOMINAL HYSTERECTOMY     BREAST CYST EXCISION  01-16-11   several excisions in the past   JOINT REPLACEMENT  01-16-11   Rt. great toe ball joint replacement-metal   REDUCTION MAMMAPLASTY  01-16-11   Bil. '86( problems with s/p surgical clot at rt. breast drainage site)   TOTAL HIP ARTHROPLASTY  01/20/2011   Procedure: TOTAL HIP ARTHROPLASTY ANTERIOR APPROACH;  Surgeon: Donnice JONETTA Car;  Location: [facility];  Service: Orthopedics;  Laterality: Right;    Current Outpatient Medications  Medication Sig Dispense Refill   albuterol (VENTOLIN HFA) 108 (90 Base) MCG/ACT inhaler albuterol sulfate HFA 90 mcg/actuation aerosol inhaler  INL 2 PFS PO Q 6 H PRN FOR [facility]     aspirin  325 MG tablet Take 325 mg daily by mouth.     cholecalciferol  (VITAMIN D ) 1000 UNITS tablet Take 1,000 Units by mouth daily.       diclofenac Sodium (VOLTAREN) 1 % GEL Apply 2 g topically daily.     diphenhydrAMINE  (BENADRYL ) 25 mg capsule Take 25 mg by mouth every 6 (six) hours as needed. Allergies       gabapentin  (NEURONTIN ) 100 MG capsule Take 2 capsules (200 mg total) by mouth at bedtime. 180 capsule 3   Homeopathic Products (ARNICARE) GEL Apply 1  application topically 4 (four) times daily as needed. pain      lidocaine  (LIDODERM ) 5 % Place 2 patches onto the skin daily. Remove & Discard patch within 12 hours or as directed by MD 60 patch 0   Magnesium  Gluconate 250 MG TABS Take by mouth.     Multiple Vitamins-Minerals (MULTIVITAMINS THER. W/MINERALS) TABS Take 1 tablet by mouth daily.       OVER THE COUNTER MEDICATION [employer] foam for back pain, uses prn  pravastatin  (PRAVACHOL ) 40 MG tablet Take 1 tablet (40 mg total) by mouth every evening. 90 tablet 3   triamcinolone  ointment (KENALOG ) 0.5 % Apply to affected area twice daily     No current facility-administered medications for this visit.    Allergies as of 09/28/2023 - Review Complete 09/28/2023  Allergen Reaction Noted   Erythromycin Anaphylaxis 01/08/2011   Ciprofloxacin hcl Itching and Swelling 01/08/2011   Contrast media [iodinated contrast media] Itching and Swelling 01/08/2011   Lidocaine  Itching and Other (See Comments) 01/08/2011   Penicillins Other (See Comments) 01/08/2011   Sulfa drugs cross reactors Itching, Swelling, and Other (See Comments) 01/08/2011    Vitals: BP 126/70   Pulse 67   Ht 5' 8 (1.727 m)   Wt 134 lb 1.6 oz (60.8 kg)   BMI 20.39 kg/m  Last Weight:  Wt Readings from Last 1 Encounters:  09/28/23 134 lb 1.6 oz (60.8 kg)   Last Height:   Ht Readings from Last 1 Encounters:  09/28/23 5' 8 (1.727 m)    Physical exam: Exam: Gen: NAD, conversant      CV: No palpitations or chest pain or SOB. VS: Breathing at a normal rate. thin. Not febrile. Eyes: Conjunctivae clear without exudates or hemorrhage  Neuro: Detailed Neurologic Exam  Speech:    Speech is normal; fluent and spontaneous with normal comprehension.  Cognition:    The patient is oriented to person, place, and time;     recent and remote memory intact;     language fluent;     normal attention, concentration, fund of knowledge Cranial Nerves:    The pupils are  equal, round, and reactive to light. Visual fields are full Extraocular movements are intact.  The face is symmetric with normal sensation. The palate elevates in the midline. Hearing intact. Voice is normal. Shoulder shrug is normal. The tongue has normal motion without fasciculations.   Coordination: normal  Gait:    No abnormalities noted or reported  Motor Observation:   no involuntary movements noted. Tone:    Appears normal  Posture:    Posture is normal. normal erect    Strength:     Left leg is slightly weaker in hip flexion and leg flexion otherwise 5/5     Sensation: intact to LT           Assessment/Plan:   Julie Tapia 73 y.o. female here as requested by Laqueta Ozell BIRCH, MD for diffuse fasciculations. has Cerebral aneurysm and PFO (patent foramen ovale) on their problem list. She is going to [facility] Dr. Laqueta and he is an anesthesiologist and he has been providing injections. She states she has not fallen for several years. Her balance is off and she feels weaker. Moving is harder. Her legs are weak. She has had muscle twitches for years. And they are the same in both legs. The twitches can wake her in the night. Her calfs and legs can cramp or become stiff and it is painful she even has to vocalize. In 2020 we did not see fasciculations. She has been having low back pain for years and in [employer] in June she lifted something heavy. Worsened her symptoms since then with back pain; She states Dr. Laqueta believes she needs SI injections this month and this past July she had lumbar ESI which helped and helped the fasciculations. She was sent here for evaluation of fasciculations. She states she is confused on the diagnosis and needs a more comprehensive understanding. Extended visit.  Did not tolerate Flexeril , discontinue Continue Gabapentin  doing well with muscle cramps and Fasciculations(none seen today in the lower legs, patient feels a lot better, sleeping better, pain  level better); Last appointment suggested to Take the Gabapentin  earlier (6pm) or at bedtime see how you feel Plan going forward: Can increase gabapentin . Also there are different muscle relaxers to try as well (Baclofen, Robaxin ) Other medications to try include Cymbalta also used in nerve pain  Consider a pain clinic referral for pain medication management  Lidoacine patches on low back, , has tried lidocain topical in the past effective bit do not last long wpould benefit from patches, Lidoacine patches on low back 5% but if insurance wont approve can get 4% patches over the counter Dry needling: Not interested, doing well  Physical Therapy: LOVED [facility] aqua therapy and land therapy, significant improvement F/u 6 months   No orders of the defined types were placed in this encounter.  No orders of the defined types were placed in this encounter.   Last Visit:  Extended visit today with patient and daughter:Here with daughter. We review prior workup. We discussed she has chronic pain. She can have injections, then facet injections or other procedures, spinal cord stimulator. She is scheduled for water therapy at [facility]. Extended visit. We discussed her fasciculations, her emg/ncs did not show neuromuscular disorder, extensive blood testing was negative. Monitoring caffeine, treating anxiety, using medications like ca-channel blockers or aeds like gabapentin  or propranolol. Discussed medications. Discussed channelopathies and other genetic disorders,pros and cons of genetic testing  Cramps: Magnesium  doses and other OTC medications you can try: 200 mg mag citrate 1-2x daily 400 mg mag oxide 1-2x daily Mag sulfate 600 mg daily Magnesium  glycinate 200-400mg  at bedtime Find a spot in 8 weeks  Tonic water with quinine, banana, magnesium  as above  Should be lifting weights, scared to hurt her back. Personal trainer.   At last visit and at this visit with daughter repeated the  following: - We reviewed images of dermatomes and myotomes in detail.  - We reviewed mri  images of the lumbar spine and cervical spine.  As well as EMG nerve conduction study.  See HPI.   No orders of the defined types were placed in this encounter.  No orders of the defined types were placed in this encounter.   Cc: Larnell Hamilton, MD,  Larnell Hamilton, MD  Onetha Epp, MD  [facility] [address] [place], [place] [address]  Phone [phone] Fax [phone]  I spent 45 minutes of face-to-face and non-face-to-face time with patient on the  1. Chronic bilateral low back pain with bilateral sciatica   2. Myofascial pain syndrome of lumbar spine   3. Nerve pain due to spinal stenosis   4. Muscle cramps   5. Benign fasciculations      diagnosis.  This included previsit chart review, lab review, study review, order entry, electronic health record documentation, patient education on the different diagnostic and therapeutic options, counseling and coordination of care, risks and benefits of management, compliance, or risk factor reduction

## 2023-09-30 ENCOUNTER — Ambulatory Visit: Attending: Nurse Practitioner | Admitting: Nurse Practitioner

## 2023-09-30 ENCOUNTER — Encounter: Payer: Self-pay | Admitting: Nurse Practitioner

## 2023-09-30 VITALS — BP 120/68 | HR 71 | Ht 68.0 in | Wt 135.2 lb

## 2023-09-30 DIAGNOSIS — Q2112 Patent foramen ovale: Secondary | ICD-10-CM | POA: Diagnosis not present

## 2023-09-30 DIAGNOSIS — E785 Hyperlipidemia, unspecified: Secondary | ICD-10-CM | POA: Insufficient documentation

## 2023-09-30 DIAGNOSIS — R931 Abnormal findings on diagnostic imaging of heart and coronary circulation: Secondary | ICD-10-CM | POA: Diagnosis not present

## 2023-09-30 DIAGNOSIS — I351 Nonrheumatic aortic (valve) insufficiency: Secondary | ICD-10-CM | POA: Diagnosis not present

## 2023-09-30 DIAGNOSIS — Z01810 Encounter for preprocedural cardiovascular examination: Secondary | ICD-10-CM | POA: Diagnosis not present

## 2023-09-30 DIAGNOSIS — I671 Cerebral aneurysm, nonruptured: Secondary | ICD-10-CM | POA: Insufficient documentation

## 2023-09-30 DIAGNOSIS — Z8673 Personal history of transient ischemic attack (TIA), and cerebral infarction without residual deficits: Secondary | ICD-10-CM | POA: Diagnosis not present

## 2023-09-30 DIAGNOSIS — I340 Nonrheumatic mitral (valve) insufficiency: Secondary | ICD-10-CM | POA: Diagnosis not present

## 2023-09-30 MED ORDER — PRAVASTATIN SODIUM 40 MG PO TABS: 40.0000 mg | ORAL_TABLET | Freq: Every evening | ORAL | 3 refills | Status: AC

## 2023-09-30 NOTE — Progress Notes (Signed)
 Office Visit    Patient Name: Julie Tapia Date of Encounter: 09/30/2023  Primary Care Provider:  Larnell Hamilton, MD Primary Cardiologist:  Lynwood Schilling, MD  Chief Complaint    73 year old female with a history of coronary artery calcium  score, aortic atherosclerosis, mitral valve regurgitation, aortic valve regurgitation, PFO, saccular aneurysm of the LICA, TIA, and hyperlipidemia who presents for follow-up related to CAD, and for preoperative cardiac evaluation.  Past Medical History    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 tight chested-no inhalers in yrs.   Complication of anesthesia 01-16-11   Pt. very slow to awaken and was days before feeling like herself.   Past Surgical History:  Procedure Laterality Date   ABDOMINAL HYSTERECTOMY     BREAST CYST EXCISION  01-16-11   several excisions in the past   JOINT REPLACEMENT  01-16-11   Rt. great toe ball joint replacement-metal   REDUCTION MAMMAPLASTY  01-16-11   Bil. '86( problems with s/p surgical clot at rt. breast drainage site)   TOTAL HIP ARTHROPLASTY  01/20/2011   Procedure: TOTAL HIP ARTHROPLASTY ANTERIOR APPROACH;  Surgeon: Donnice JONETTA Car;  Location: [facility];  Service: Orthopedics;  Laterality: Right;    Allergies  Allergies  Allergen Reactions   Erythromycin Anaphylaxis    All mycins   Ciprofloxacin Hcl Itching and Swelling   Contrast Media [Iodinated Contrast Media] Itching and Swelling   Lidocaine  Itching and Other (See Comments)    Redness    Penicillins Other (See Comments)    Felt like had Chickenpox 01-16-11 Keflex  is antibiotic of choice per pt.-[facility]   Sulfa Drugs [facility]  [employer] Itching, Swelling and Other (See Comments)    All sulfa drugs     Labs/Other Studies Reviewed    The following studies were reviewed today:  Cardiac Studies & Procedures   ______________________________________________________________________________________________   STRESS TESTS  EXERCISE TOLERANCE TEST (ETT) 07/15/2022  Interpretation Summary   Normal ETT   ECHOCARDIOGRAM  ECHOCARDIOGRAM COMPLETE BUBBLE STUDY 06/03/2022  Narrative ECHOCARDIOGRAM REPORT    Patient Name:   Julie Tapia Date of Exam: 06/03/2022 Medical Rec #:  [id number]     Height:       67.5 in Accession #:    [id number]    Weight:       128.0 lb Date of Birth:  10/11/1950     BSA:          1.682 m Patient Age:    71 years      BP:           120/78 mmHg Patient Gender: F             HR:           65 bpm. Exam Location:  [address]  Procedure: 2D Echo, Cardiac Doppler, Color Doppler, Saline Contrast Bubble Study, 3D Echo and Strain Analysis  Indications:    PFO Q21.1  History:        Patient has no prior history of Echocardiogram examinations. TIA.  Sonographer:    Augustin Seals RDCS Referring Phys: [id number] JAMES HOCHREIN  IMPRESSIONS   1. No evidence of atrial level  shunt on this exam. Negative agitated saline injection. 2. Left ventricular ejection fraction by 3D volume is 62 %. The left ventricle has normal function. The left ventricle has no regional wall motion abnormalities. Left ventricular diastolic parameters were normal. The average left ventricular global longitudinal strain is -20.2 %. The global longitudinal strain is normal. 3. Right ventricular systolic function is normal. The right ventricular size is normal. There is normal pulmonary artery systolic pressure. The estimated right ventricular systolic pressure is 30.5 mmHg. 4. The mitral valve is grossly normal. Mild mitral valve regurgitation. No evidence of mitral stenosis. 5. The aortic valve is tricuspid. There is  mild calcification of the aortic valve. There is mild thickening of the aortic valve. Aortic valve regurgitation is mild. Aortic valve sclerosis is present, with no evidence of aortic valve stenosis. 6. The inferior vena cava is normal in size with greater than 50% respiratory variability, suggesting right atrial pressure of 3 mmHg. 7. Agitated saline contrast bubble study was negative, with no evidence of any interatrial shunt.  FINDINGS Left Ventricle: Left ventricular ejection fraction by 3D volume is 62 %. The left ventricle has normal function. The left ventricle has no regional wall motion abnormalities. The average left ventricular global longitudinal strain is -20.2 %. The global longitudinal strain is normal. The left ventricular internal cavity size was normal in size. There is no left ventricular hypertrophy. Left ventricular diastolic parameters were normal.  Right Ventricle: The right ventricular size is normal. No increase in right ventricular wall thickness. Right ventricular systolic function is normal. There is normal pulmonary artery systolic pressure. The tricuspid regurgitant velocity is 2.62 m/s, and with an assumed right atrial pressure of 3 mmHg, the estimated right ventricular systolic pressure is 30.5 mmHg.  Left Atrium: Left atrial size was normal in size.  Right Atrium: Right atrial size was normal in size.  Pericardium: There is no evidence of pericardial effusion.  Mitral Valve: The mitral valve is grossly normal. Mild mitral valve regurgitation. No evidence of mitral valve stenosis.  Tricuspid Valve: The tricuspid valve is normal in structure. Tricuspid valve regurgitation is mild . No evidence of tricuspid stenosis.  Aortic Valve: The aortic valve is tricuspid. There is mild calcification of the aortic valve. There is mild thickening of the aortic valve. Aortic valve regurgitation is mild. Aortic regurgitation PHT measures 624 msec. Aortic valve sclerosis is  present, with no evidence of aortic valve stenosis.  Pulmonic Valve: The pulmonic valve was normal in structure. Pulmonic valve regurgitation is trivial. No evidence of pulmonic stenosis.  Aorta: The aortic root is normal in size and structure.  Venous: The inferior vena cava is normal in size with greater than 50% respiratory variability, suggesting right atrial pressure of 3 mmHg.  IAS/Shunts: No atrial level shunt detected by color flow Doppler. Agitated saline contrast was given intravenously to evaluate for intracardiac shunting. Agitated saline contrast bubble study was negative, with no evidence of any interatrial shunt.   LEFT VENTRICLE PLAX 2D LVIDd:         5.10 cm         Diastology LVIDs:         3.00 cm         LV e' medial:    9.14 cm/s LV PW:         0.80 cm         LV E/e' medial:  6.5 LV IVS:        0.70 cm  LV e' lateral:   7.72 cm/s LVOT diam:     2.00 cm         LV E/e' lateral: 7.7 LV SV:         67 LV SV Index:   40              2D LVOT Area:     3.14 cm        Longitudinal Strain 2D Strain GLS  -20.2 % Avg:  3D Volume EF LV 3D EF:    Left ventricul ar ejection fraction by 3D volume is 62 %.  3D Volume EF: 3D EF:        62 % LV EDV:       140 ml LV ESV:       53 ml LV SV:        87 ml  RIGHT VENTRICLE RV Basal diam:  3.60 cm RV Mid diam:    3.00 cm RV S prime:     13.50 cm/s TAPSE (M-mode): 2.3 cm  LEFT ATRIUM             Index        RIGHT ATRIUM           Index LA diam:        3.70 cm 2.20 cm/m   RA Area:     13.50 cm LA Vol (A2C):   52.7 ml 31.33 ml/m  RA Volume:   30.70 ml  18.25 ml/m LA Vol (A4C):   45.1 ml 26.81 ml/m LA Biplane Vol: 49.5 ml 29.43 ml/m AORTIC VALVE LVOT Vmax:   90.80 cm/s LVOT Vmean:  56.600 cm/s LVOT VTI:    0.213 m AI PHT:      624 msec  AORTA Ao Root diam: 2.80 cm  MITRAL VALVE               TRICUSPID VALVE MV Area (PHT): 2.87 cm    TR Peak grad:   27.5 mmHg MV Decel Time: 264 msec    TR  Vmax:        262.00 cm/s MR Peak grad: 96.4 mmHg MR Mean grad: 68.0 mmHg    SHUNTS MR Vmax:      491.00 cm/s  Systemic VTI:  0.21 m MR Vmean:     397.0 cm/s   Systemic Diam: 2.00 cm MV E velocity: 59.10 cm/s MV A velocity: 51.80 cm/s MV E/A ratio:  1.14  Soyla Merck MD Electronically signed by Soyla Merck MD Signature Date/Time: 06/03/2022/3:52:49 PM    Final      CT SCANS  CT CARDIAC SCORING (SELF PAY ONLY) 06/01/2022  Addendum 06/03/2022 11:51 AM ADDENDUM REPORT: 06/03/2022 11:49  EXAM: OVER-READ INTERPRETATION  CT CHEST  The following report is an over-read performed by radiologist Dr. Juliene Cramp [facility] on 06/03/2022. This over-read does not include interpretation of cardiac or coronary anatomy or pathology. The coronary calcium  score interpretation by the cardiologist is attached.  COMPARISON:  None.  FINDINGS: Question a small hiatal hernia. Otherwise, the visualized mediastinal structures are unremarkable. Images of the upper abdomen are unremarkable. No airspace disease or consolidation in the visualized lungs. 3 mm nodule in the left lower lobe on image 60/2. A punctate nodule in the anterior right lung on image 27/2. Additional tiny nodules in the left lower lobe. No acute bone abnormality.  IMPRESSION: 1. No acute extracardiac findings. 2. Multiple pulmonary nodules. Most significant: Left solid pulmonary nodule measuring 3 mm. Per  Fleischner Society Guidelines, no routine follow-up imaging is recommended. These guidelines do not apply to immunocompromised patients and patients with cancer. Follow up in patients with significant comorbidities as clinically warranted. For lung cancer screening, adhere to Lung-RADS guidelines. Reference: Radiology. 2017; 284(1):228-43.   Electronically Signed By: Juliene Balder M.D. On: 06/03/2022 11:49  Narrative CLINICAL DATA:  82F for cardiovascular disease risk stratification  EXAM: Coronary  Calcium  Score  TECHNIQUE: A gated, non-contrast computed tomography scan of the heart was performed using 3mm slice thickness. Axial images were analyzed on a dedicated workstation. Calcium  scoring of the coronary arteries was performed using the Agatston method.  FINDINGS: Coronary Calcium  Score:  Left main: 12.4  Left anterior descending artery: 152  Left circumflex artery: 145  Right coronary artery: 12.8  Total: 322  Percentile: 87th  Pericardium: Normal.  Aortic atherosclerosis.  Non-cardiac: See separate report from [facility].  IMPRESSION: 1. Coronary calcium  score of 322. This was 87th percentile for age-, race-, and sex-matched controls.  2.  Aortic atherosclerosis.  RECOMMENDATIONS: Coronary artery calcium  (CAC) score is a strong predictor of incident coronary heart disease (CHD) and provides predictive information beyond traditional risk factors. CAC scoring is reasonable to use in the decision to withhold, postpone, or initiate statin therapy in intermediate-risk or selected borderline-risk asymptomatic adults (age 54-75 years and LDL-C >=70 to <190 mg/dL) who do not have diabetes or established atherosclerotic cardiovascular disease (ASCVD).* In intermediate-risk (10-year ASCVD risk >=7.5% to <20%) adults or selected borderline-risk (10-year ASCVD risk >=5% to <7.5%) adults in whom a CAC score is measured for the purpose of making a treatment decision the following recommendations have been made:  If CAC=0, it is reasonable to withhold statin therapy and reassess in 5 to 10 years, as long as higher risk conditions are absent (diabetes mellitus, family history of premature CHD in first degree relatives (males <55 years; females <65 years), cigarette smoking, or LDL >=190 mg/dL).  If CAC is 1 to 99, it is reasonable to initiate statin therapy for patients >=37 years of age.  If CAC is >=100 or >=75th percentile, it is reasonable to  initiate statin therapy at any age.  Cardiology referral should be considered for patients with CAC scores >=400 or >=75th percentile.  *2018 AHA/ACC/AACVPR/AAPA/ABC/ACPM/ADA/AGS/APhA/ASPC/NLA/PCNA Guideline on the Management of Blood Cholesterol: A Report of the American College of Cardiology/American Heart Association Task Force on Clinical Practice Guidelines. J Am Coll Cardiol. 2019;73(24):3168-3209.  Annabella Scarce, MD  Electronically Signed: By: Annabella Scarce M.D. On: 06/01/2022 18:07     ______________________________________________________________________________________________     Recent Labs: 12/07/2022: ALT 26; BUN 10; Creatinine, Ser 0.70; Hemoglobin 12.9; Magnesium  2.0; Platelets 308; Potassium 4.8; Sodium 142; TSH 1.650  Recent Lipid Panel    Component Value Date/Time   CHOL (H) 01/18/2010 0622    231        ATP III CLASSIFICATION:  <200     mg/dL   Desirable  799-760  mg/dL   Borderline High  >=759    mg/dL   High          TRIG 883 01/18/2010 0622   HDL 71 01/18/2010 0622   CHOLHDL 3.3 01/18/2010 0622   VLDL 23 01/18/2010 0622   LDLCALC (H) 01/18/2010 0622    137        Total Cholesterol/HDL:CHD Risk Coronary Heart Disease Risk Table                     Men  Women  1/2 Average Risk   3.4   3.3  Average Risk       5.0   4.4  2 X Average Risk   9.6   7.1  3 X Average Risk  23.4   11.0        Use the calculated Patient Ratio above and the CHD Risk Table to determine the patient's CHD Risk.        ATP III CLASSIFICATION (LDL):  <100     mg/dL   Optimal  899-870  mg/dL   Near or Above                    Optimal  130-159  mg/dL   Borderline  839-810  mg/dL   High  >809     mg/dL   Very High    History of Present Illness    73 year old female with the above past medical history including elevated coronary artery calcium  score, aortic atherosclerosis, mitral valve regurgitation, aortic valve regurgitation, PFO, saccular aneurysm of the  LICA, TIA, and hyperlipidemia.  She has a prior documented history of PFO (echocardiogram in 2012 at [facility] suggested possible PFO).  Does have a history of TIA.  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  She was last seen in the office on 04/30/2022 and was stable from a cardiac standpoint.  Echocardiogram in 06/2022 showed EF 62%, normal LV function, no RWMA, normal RV systolic function, mild mitral valve regurgitation, mild aortic valve regurgitation, no evidence of PFO, negative bubble study. Coronary calcium  score in 06/2022 was 322 (87 percentile). ETT in 07/2022 was normal.   She presents today for follow-up accompanied by her daughter and for preoperative cardiac evaluation for upcoming left total hip arthroplasty on 11/09/2023 with Dr. Donnice Car of [facility].  Since her last visit she has done well from a cardiac standpoint.  She denies any symptoms concerning for angina, denies palpitations, dizziness, dyspnea, edema, PND, orthopnea, weight gain.  She recently joined [employer]  She exercises regularly, and walks approximately 7500 steps most days of the week.  She is scheduled to have repeat CT for monitoring of saccular aneurysm of LICA at [facility] tomorrow (10/01/2023). Overall, she reports feeling well.  Home Medications    Current Outpatient Medications  Medication Sig Dispense Refill   albuterol (VENTOLIN HFA) 108 (90 Base) MCG/ACT inhaler albuterol sulfate HFA 90 mcg/actuation aerosol inhaler  INL 2 PFS PO Q 6 H PRN FOR [facility]     aspirin  325 MG tablet Take 325 mg daily by mouth.     cholecalciferol  (VITAMIN D ) 1000 UNITS tablet Take 1,000 Units by mouth daily.       diclofenac Sodium (VOLTAREN) 1 % GEL Apply 2 g topically daily.     diphenhydrAMINE  (BENADRYL ) 25 mg capsule Take 25 mg by mouth every 6 (six) hours as needed. Allergies       gabapentin  (NEURONTIN ) 100 MG capsule Take 2 capsules  (200 mg total) by mouth at bedtime. 180 capsule 3   Homeopathic Products (ARNICARE) GEL Apply 1 application topically 4 (four) times daily as needed. pain      lidocaine  (LIDODERM ) 5 % Place 2 patches onto the skin daily. Remove & Discard patch within 12 hours or as directed by MD 60 patch 0   Magnesium  Gluconate 250 MG TABS Take by mouth.     Multiple Vitamins-Minerals (MULTIVITAMINS THER.  W/MINERALS) TABS Take 1 tablet by mouth daily.       OVER THE COUNTER MEDICATION [employer] foam for back pain, uses prn     pravastatin  (PRAVACHOL ) 40 MG tablet Take 1 tablet (40 mg total) by mouth every evening. 90 tablet 3   triamcinolone  ointment (KENALOG ) 0.5 % Apply to affected area twice daily     No current facility-administered medications for this visit.     Review of Systems    She denies chest pain, palpitations, dyspnea, pnd, orthopnea, n, v, dizziness, syncope, edema, weight gain, or early satiety. All other systems reviewed and are otherwise negative except as noted above.   Physical Exam    VS:  BP 120/68 (BP Location: Right Arm, Patient Position: Sitting)   Pulse 71   Ht 5' 8 (1.727 m)   Wt 135 lb 3.2 oz (61.3 kg)   SpO2 97%   BMI 20.56 kg/m  GEN: Well nourished, well developed, in no acute distress. HEENT: normal. Neck: Supple, no JVD, carotid bruits, or masses. Cardiac: RRR, no murmurs, rubs, or gallops. No clubbing, cyanosis, edema.  Radials/DP/PT 2+ and equal bilaterally.  Respiratory:  Respirations regular and unlabored, clear to auscultation bilaterally. GI: Soft, nontender, nondistended, BS + x 4. MS: no deformity or atrophy. Skin: warm and dry, no rash. Neuro:  Strength and sensation are intact. Psych: Normal affect.  Accessory Clinical Findings    ECG personally reviewed by me today - EKG Interpretation Date/Time:  Thursday September 30 2023 08:41:32 EDT Ventricular Rate:  71 PR Interval:  134 QRS Duration:  80 QT Interval:  374 QTC Calculation: 406 R  Axis:   66  Text Interpretation: Normal sinus rhythm Normal ECG When compared with ECG of 17-Jan-2010 13:02, No significant change was found Confirmed by Daneen Perkins ([id number]) on 09/30/2023 8:47:26 AM  - no acute changes.   Lab Results  Component Value Date   WBC 6.8 12/07/2022   HGB 12.9 12/07/2022   HCT 39.6 12/07/2022   MCV 101 (H) 12/07/2022   PLT 308 12/07/2022   Lab Results  Component Value Date   CREATININE 0.70 12/07/2022   BUN 10 12/07/2022   NA 142 12/07/2022   K 4.8 12/07/2022   CL 103 12/07/2022   CO2 25 12/07/2022   Lab Results  Component Value Date   ALT 26 12/07/2022   AST 27 12/07/2022   ALKPHOS 103 12/07/2022   BILITOT 0.3 12/07/2022   Lab Results  Component Value Date   CHOL (H) 01/18/2010    231        ATP III CLASSIFICATION:  <200     mg/dL   Desirable  799-760  mg/dL   Borderline High  >=759    mg/dL   High          HDL 71 01/18/2010   LDLCALC (H) 01/18/2010    137        Total Cholesterol/HDL:CHD Risk Coronary Heart Disease Risk Table                     Men   Women  1/2 Average Risk   3.4   3.3  Average Risk       5.0   4.4  2 X Average Risk   9.6   7.1  3 X Average Risk  23.4   11.0        Use the calculated Patient Ratio above and the CHD Risk Table to determine the patient's CHD Risk.  ATP III CLASSIFICATION (LDL):  <100     mg/dL   Optimal  899-870  mg/dL   Near or Above                    Optimal  130-159  mg/dL   Borderline  839-810  mg/dL   High  >809     mg/dL   Very High   TRIG 883 01/18/2010   CHOLHDL 3.3 01/18/2010    Lab Results  Component Value Date   HGBA1C  01/17/2010    5.5 (NOTE)                                                                       According to the ADA Clinical Practice Recommendations for 2011, when HbA1c is used as a screening test:   >=6.5%   Diagnostic of Diabetes Mellitus           (if abnormal result  is confirmed)  5.7-6.4%   Increased risk of developing Diabetes Mellitus   References:Diagnosis and Classification of Diabetes Mellitus,Diabetes Care,2011,34(Suppl 1):S62-S69 and Standards of Medical Care in         Diabetes - 2011,Diabetes Care,2011,34  (Suppl 1):S11-S61.    Assessment & Plan    1. Elevated coronary artery calcium  score/aortic atherosclerosis: Coronary calcium  score in 06/2022 was 322 (87 percentile).  ETT in 07/2022 was normal.  Stable with no anginal symptoms. No indication for ischemic evaluation.  Continue pravastatin .  2. Valvular heart disease: Echo in 06/2022 showed EF 62%, normal LV function, no RWMA, normal RV systolic function, mild mitral valve regurgitation, mild aortic valve regurgitation, no evidence of PFO, negative bubble study.  Asymptomatic. Euvolemic and well compensated on exam.  Consider repeat echocardiogram as clinically indicated.  3. History of PFO: Echo in 2012 at [facility] suggested possible PFO.  No evidence of PFO on most recent echocardiogram, negative bubble study.  4. Saccular aneurysm of LICA: Followed by [facility].  She is pending repeat CT on 10/01/2023.  5. History of TIA: Continue aspirin , pravastatin .   6. Hyperlipidemia: LDL was 111 in 04/2023, above goal.  We discussed LDL goal less than 70 given history of coronary artery calcification, history of possible TIA, aneurysm of LICA.  She had significant arthralgias/myalgias with rosuvastatin .  She has tolerated her current dose of pravastatin .  We discussed possible escalation of pravastatin , possible addition of Zetia, consideration of PCSK9 inhibitor.  She verbalized understanding.  She declines escalation of statin therapy today.  Continue lifestyle modifications with diet and exercise.  For now, continue pravastatin  at current dose..  7. Preoperative cardiac exam: According to the Revised Cardiac Risk Index (RCRI), her Perioperative Risk of Major Cardiac Event is (%): 0.9. Her Functional Capacity in METs is: 6.27 according to the [facility] Activity Status Index (DASI).  Therefore, based on ACC/AHA guidelines, patient would be at acceptable risk for the planned procedure without further cardiovascular testing.  Patient takes aspirin  325 mg daily for history of TIA, saccular aneurysm of LICA. This is not managed by allergy.  Recommendations were holding aspirin  prior to surgery should come from managing provider.  I will route this recommendation to the requesting party via Epic fax function.  8. Disposition: Follow-up in 1 year,  sooner if needed.      Damien JAYSON Braver, NP 09/30/2023, 9:17 AM

## 2023-09-30 NOTE — Patient Instructions (Signed)
 Follow-Up: At [facility], you and your health needs are our priority.  As part of our continuing mission to provide you with exceptional heart care, our providers are all part of one team.  This team includes your primary Cardiologist (physician) and Advanced Practice Providers or APPs (Physician Assistants and Nurse Practitioners) who all work together to provide you with the care you need, when you need it.  Your next appointment:   1 year(s)  Provider:   Eilleen Grates, MD

## 2023-10-01 DIAGNOSIS — I671 Cerebral aneurysm, nonruptured: Secondary | ICD-10-CM | POA: Diagnosis not present

## 2023-10-25 DIAGNOSIS — M25562 Pain in left knee: Secondary | ICD-10-CM | POA: Diagnosis not present

## 2023-10-25 DIAGNOSIS — M1712 Unilateral primary osteoarthritis, left knee: Secondary | ICD-10-CM | POA: Diagnosis not present

## 2023-11-02 NOTE — Patient Instructions (Signed)
 SURGICAL WAITING ROOM VISITATION  Patients having surgery or a procedure may have no more than 2 support people in the waiting area - these visitors may rotate.    Children under the age of 59 must have an adult with them who is not the patient.  Visitors with respiratory illnesses are discouraged from visiting and should remain at home.  If the patient needs to stay at the hospital during part of their recovery, the visitor guidelines for inpatient rooms apply. Pre-op nurse will coordinate an appropriate time for 1 support person to accompany patient in pre-op.  This support person may not rotate.    Please refer to the [facility] website for the visitor guidelines for Inpatients (after your surgery is over and you are in a regular room).       Your procedure is scheduled on: 11/09/23   Report to [facility] Main Entrance    Report to admitting at 6:10 AM   Call this number if you have problems the morning of surgery [phone]   Do not eat food :After Midnight.   After Midnight you may have the following liquids until 5:40 AM DAY OF SURGERY  Water Non-Citrus Juices (without pulp, NO RED-Apple, White grape, White cranberry) Black Coffee (NO MILK/CREAM OR CREAMERS, sugar ok)  Clear Tea (NO MILK/CREAM OR CREAMERS, sugar ok) regular and decaf                             Plain Jell-O (NO RED)                                           Fruit ices (not with fruit pulp, NO RED)                                     Popsicles (NO RED)                                                               Sports drinks like Gatorade (NO RED)                  The day of surgery:  Drink ONE (1) Pre-Surgery Clear Ensure at 5:40  AM the morning of surgery. Drink in one sitting. Do not sip.  This drink was given to you during your hospital  pre-op appointment visit. Nothing else to drink after completing the  Pre-Surgery Clear Ensure.         Oral Hygiene is also important to reduce  your risk of infection.                                    Remember - BRUSH YOUR TEETH THE MORNING OF SURGERY WITH YOUR REGULAR TOOTHPASTE  DENTURES WILL BE REMOVED PRIOR TO SURGERY PLEASE DO NOT APPLY Poly grip OR ADHESIVES!!!   Stop all vitamins and herbal supplements 7 days before surgery.   Take these medicines the morning of surgery with A SIP OF  WATER: Pravachol (pravastatin ), inhaler             You may not have any metal on your body including hair pins, jewelry, and body piercing             Do not wear make-up, lotions, powders, perfumes/cologne, or deodorant  Do not wear nail polish including gel and S&S, artificial/acrylic nails, or any other type of covering on natural nails including finger and toenails. If you have artificial nails, gel coating, etc. that needs to be removed by a nail salon please have this removed prior to surgery or surgery may need to be canceled/ delayed if the surgeon/ anesthesia feels like they are unable to be safely monitored.   Do not shave  48 hours prior to surgery.    Do not bring valuables to the hospital. [place] IS NOT             RESPONSIBLE   FOR VALUABLES.   Contacts, glasses, dentures or bridgework may not be worn into surgery.   Bring small overnight bag day of surgery.   DO NOT BRING YOUR HOME MEDICATIONS TO THE HOSPITAL. PHARMACY WILL DISPENSE MEDICATIONS LISTED ON YOUR MEDICATION LIST TO YOU DURING YOUR ADMISSION IN THE HOSPITAL!    Patients discharged on the day of surgery will not be allowed to drive home.  Someone NEEDS to stay with you for the first 24 hours after anesthesia.   Special Instructions: Bring a copy of your healthcare power of attorney and living will documents the day of surgery if you haven't scanned them before.              Please read over the following fact sheets you were given: IF YOU HAVE QUESTIONS ABOUT YOUR PRE-OP INSTRUCTIONS PLEASE CALL [phone] [place].   If you received a COVID test during  your pre-op visit  it is requested that you wear a mask when out in public, stay away from anyone that may not be feeling well and notify your surgeon if you develop symptoms. If you test positive for Covid or have been in contact with anyone that has tested positive in the last 10 days please notify you surgeon.      Pre-operative 5 CHG Bath Instructions   You can play a key role in reducing the risk of infection after surgery. Your skin needs to be as free of germs as possible. You can reduce the number of germs on your skin by washing with CHG (chlorhexidine gluconate) soap before surgery. CHG is an antiseptic soap that kills germs and continues to kill germs even after washing.   DO NOT use if you have an allergy to chlorhexidine/CHG or antibacterial soaps. If your skin becomes reddened or irritated, stop using the CHG and notify one of our RNs at [phone].   Please shower with the CHG soap starting 4 days before surgery using the following schedule:     Please keep in mind the following:  DO NOT shave, including legs and underarms, starting the day of your first shower.   You may shave your face at any point before/day of surgery.  Place clean sheets on your bed the day you start using CHG soap. Use a clean washcloth (not used since being washed) for each shower. DO NOT sleep with pets once you start using the CHG.   CHG Shower Instructions:  If you choose to wash your hair and private area, wash first with your normal shampoo/soap.  After you  use shampoo/soap, rinse your hair and body thoroughly to remove shampoo/soap residue.  Turn the water OFF and apply about 3 tablespoons (45 ml) of CHG soap to a CLEAN washcloth.  Apply CHG soap ONLY FROM YOUR NECK DOWN TO YOUR TOES (washing for 3-5 minutes)  DO NOT use CHG soap on face, private areas, open wounds, or sores.  Pay special attention to the area where your surgery is being performed.  If you are having back surgery, having  someone wash your back for you may be helpful. Wait 2 minutes after CHG soap is applied, then you may rinse off the CHG soap.  Pat dry with a clean towel  Put on clean clothes/pajamas   If you choose to wear lotion, please use ONLY the CHG-compatible lotions on the back of this paper.     Additional instructions for the day of surgery: DO NOT APPLY any lotions, deodorants, cologne, or perfumes.   Put on clean/comfortable clothes.  Brush your teeth.  Ask your nurse before applying any prescription medications to the skin.      CHG Compatible Lotions   Aveeno Moisturizing lotion  Cetaphil Moisturizing Cream  Cetaphil Moisturizing Lotion  Clairol Herbal Essence Moisturizing Lotion, Dry Skin  Clairol Herbal Essence Moisturizing Lotion, Extra Dry Skin  Clairol Herbal Essence Moisturizing Lotion, Normal Skin  Curel Age Defying Therapeutic Moisturizing Lotion with Alpha Hydroxy  Curel Extreme Care Body Lotion  Curel Soothing Hands Moisturizing Hand Lotion  Curel Therapeutic Moisturizing Cream, Fragrance-Free  Curel Therapeutic Moisturizing Lotion, Fragrance-Free  Curel Therapeutic Moisturizing Lotion, Original Formula  Eucerin Daily Replenishing Lotion  Eucerin Dry Skin Therapy Plus Alpha Hydroxy Crme  Eucerin Dry Skin Therapy Plus Alpha Hydroxy Lotion  Eucerin Original Crme  Eucerin Original Lotion  Eucerin Plus Crme Eucerin Plus Lotion  Eucerin TriLipid Replenishing Lotion  Keri Anti-Bacterial Hand Lotion  Keri Deep Conditioning Original Lotion Dry Skin Formula Softly Scented  Keri Deep Conditioning Original Lotion, Fragrance Free Sensitive Skin Formula  Keri Lotion Fast Absorbing Fragrance Free Sensitive Skin Formula  Keri Lotion Fast Absorbing Softly Scented Dry Skin Formula  Keri Original Lotion  Keri Skin Renewal Lotion Keri Silky Smooth Lotion  Keri Silky Smooth Sensitive Skin Lotion  Nivea Body Creamy Conditioning Oil  Nivea Body Extra Enriched Lotion  Nivea Body  Original Lotion  Nivea Body Sheer Moisturizing Lotion Nivea Crme  Nivea Skin Firming Lotion  NutraDerm 30 Skin Lotion  NutraDerm Skin Lotion  NutraDerm Therapeutic Skin Cream  NutraDerm Therapeutic Skin Lotion  ProShield Protective Hand Cream    Incentive Spirometer (Watch this video at home: [email or web address])  An incentive spirometer is a tool that can help keep your lungs clear and active. This tool measures how well you are filling your lungs with each breath. Taking long deep breaths may help reverse or decrease the chance of developing breathing (pulmonary) problems (especially infection) following: A long period of time when you are unable to move or be active. BEFORE THE PROCEDURE  If the spirometer includes an indicator to show your best effort, your nurse or respiratory therapist will set it to a desired goal. If possible, sit up straight or lean slightly forward. Try not to slouch. Hold the incentive spirometer in an upright position. INSTRUCTIONS FOR USE  Sit on the edge of your bed if possible, or sit up as far as you can in bed or on a chair. Hold the incentive spirometer in an upright position. Breathe out normally. Place the mouthpiece in your mouth  and seal your lips tightly around it. Breathe in slowly and as deeply as possible, raising the piston or the ball toward the top of the column. Hold your breath for 3-5 seconds or for as long as possible. Allow the piston or ball to fall to the bottom of the column. Remove the mouthpiece from your mouth and breathe out normally. Rest for a few seconds and repeat Steps 1 through 7 at least 10 times every 1-2 hours when you are awake. Take your time and take a few normal breaths between deep breaths. The spirometer may include an indicator to show your best effort. Use the indicator as a goal to work toward during each repetition. After each set of 10 deep breaths, practice coughing to be sure your  lungs are clear. If you have an incision (the cut made at the time of surgery), support your incision when coughing by placing a pillow or rolled up towels firmly against it. Once you are able to get out of bed, walk around indoors and cough well. You may stop using the incentive spirometer when instructed by your caregiver.  RISKS AND COMPLICATIONS Take your time so you do not get dizzy or light-headed. If you are in pain, you may need to take or ask for pain medication before doing incentive spirometry. It is harder to take a deep breath if you are having pain. AFTER USE Rest and breathe slowly and easily. It can be helpful to keep track of a log of your progress. Your caregiver can provide you with a simple table to help with this. If you are using the spirometer at home, follow these instructions: SEEK MEDICAL CARE IF:  You are having difficultly using the spirometer. You have trouble using the spirometer as often as instructed. Your pain medication is not giving enough relief while using the spirometer. You develop fever of 100.5 F (38.1 C) or higher. SEEK IMMEDIATE MEDICAL CARE IF:  You cough up bloody sputum that had not been present before. You develop fever of 102 F (38.9 C) or greater. You develop worsening pain at or near the incision site. MAKE SURE YOU:  Understand these instructions. Will watch your condition. Will get help right away if you are not doing well or get worse. Document Released: 06/29/2006 Document Revised: 05/11/2011 Document Reviewed: 08/30/2006  WHAT IS A BLOOD TRANSFUSION? Blood Transfusion Information  A transfusion is the replacement of blood or some of its parts. Blood is made up of multiple cells which provide different functions. Red blood cells carry oxygen and are used for blood loss replacement. White blood cells fight against infection. Platelets control bleeding. Plasma helps clot blood. Other blood products are available for specialized  needs, such as hemophilia or other clotting disorders. BEFORE THE TRANSFUSION  Who gives blood for transfusions?  Healthy volunteers who are fully evaluated to make sure their blood is safe. This is blood bank blood. Transfusion therapy is the safest it has ever been in the practice of medicine. Before blood is taken from a donor, a complete history is taken to make sure that person has no history of diseases nor engages in risky social behavior (examples are intravenous drug use or sexual activity with multiple partners). The donor's travel history is screened to minimize risk of transmitting infections, such as malaria. The donated blood is tested for signs of infectious diseases, such as HIV and hepatitis. The blood is then tested to be sure it is compatible with you in order to  minimize the chance of a transfusion reaction. If you or a relative donates blood, this is often done in anticipation of surgery and is not appropriate for emergency situations. It takes many days to process the donated blood. RISKS AND COMPLICATIONS Although transfusion therapy is very safe and saves many lives, the main dangers of transfusion include:  Getting an infectious disease. Developing a transfusion reaction. This is an allergic reaction to something in the blood you were given. Every precaution is taken to prevent this. The decision to have a blood transfusion has been considered carefully by your caregiver before blood is given. Blood is not given unless the benefits outweigh the risks. AFTER THE TRANSFUSION Right after receiving a blood transfusion, you will usually feel much better and more energetic. This is especially true if your red blood cells have gotten low (anemic). The transfusion raises the level of the red blood cells which carry oxygen, and this usually causes an energy increase. The nurse administering the transfusion will monitor you carefully for complications. HOME CARE INSTRUCTIONS  No special  instructions are needed after a transfusion. You may find your energy is better. Speak with your caregiver about any limitations on activity for underlying diseases you may have. SEEK MEDICAL CARE IF:  Your condition is not improving after your transfusion. You develop redness or irritation at the intravenous (IV) site. SEEK IMMEDIATE MEDICAL CARE IF:  Any of the following symptoms occur over the next 12 hours: Shaking chills. You have a temperature by mouth above 102 F (38.9 C), not controlled by medicine. Chest, back, or muscle pain. People around you feel you are not acting correctly or are confused. Shortness of breath or difficulty breathing. Dizziness and fainting. You get a rash or develop hives. You have a decrease in urine output. Your urine turns a dark color or changes to pink, red, or brown. Any of the following symptoms occur over the next 10 days: You have a temperature by mouth above 102 F (38.9 C), not controlled by medicine. Shortness of breath. Weakness after normal activity. The white part of the eye turns yellow (jaundice). You have a decrease in the amount of urine or are urinating less often. Your urine turns a dark color or changes to pink, red, or brown. Document Released: 02/14/2000 Document Revised: 05/11/2011 Document Reviewed: 10/03/2007 [facility] Patient Information 2014 [place], [place].

## 2023-11-02 NOTE — Progress Notes (Addendum)
 COVID Vaccine received:  []  No [x]  Yes Date of any COVID positive Test in last 90 days: no PCP - Dr. Glendia Freeman Cardiologist - Dr. Lynwood Schilling  Chest x-ray -  EKG -  09/30/23 Epic Stress Test - 07/15/22 EPIC ECHO - 06/03/22 Epic Cardiac Cath -   Cardiac clearance 09/30/23- Damien Braver NP  Bowel Prep - [x]  No  []   Yes ______  Pacemaker / ICD device [x]  No []  Yes   Spinal Cord Stimulator:[x]  No []  Yes       History of Sleep Apnea? [x]  No []  Yes   CPAP used?- [x]  No []  Yes    Does the patient monitor blood sugar?          [x]  No []  Yes  []  N/A  Patient has: [x]  NO Hx DM   []  Pre-DM                 []  DM1  []   DM2 Does patient have a [employer] or Dexacom? []  No []  Yes   Fasting Blood Sugar Ranges-  Checks Blood Sugar _____ times a day  GLP1 agonist / usual dose - no GLP1 instructions:  SGLT-2 inhibitors / usual dose - no SGLT-2 instructions:   Blood Thinner / Instructions:no Aspirin  Instructions:325 mg ASA-  last dose 11/04/23  Comments:   Activity level: Patient is able  to climb a flight of stairs without difficulty; []  No CP  []  No SOB,   Patient can perform ADLs without assistance.   Anesthesia review: PFO, Cerebral aneurysm  Patient denies shortness of breath, fever, cough and chest pain at PAT appointment.  Patient verbalized understanding and agreement to the Pre-Surgical Instructions that were given to them at this PAT appointment. Patient was also educated of the need to review these PAT instructions again prior to his/her surgery.I reviewed the appropriate phone numbers to call if they have any and questions or concerns.

## 2023-11-03 ENCOUNTER — Other Ambulatory Visit: Payer: Self-pay

## 2023-11-03 ENCOUNTER — Encounter (HOSPITAL_COMMUNITY): Payer: Self-pay

## 2023-11-03 ENCOUNTER — Encounter (HOSPITAL_COMMUNITY)
Admission: RE | Admit: 2023-11-03 | Discharge: 2023-11-03 | Disposition: A | Discharge status: Home / Self Care | Source: Ambulatory Visit | Attending: Orthopedic Surgery | Admitting: Orthopedic Surgery

## 2023-11-03 VITALS — BP 129/83 | HR 67 | Temp 98.4°F | Resp 16 | Ht 68.0 in | Wt 134.0 lb

## 2023-11-03 DIAGNOSIS — Z01812 Encounter for preprocedural laboratory examination: Secondary | ICD-10-CM | POA: Diagnosis not present

## 2023-11-03 DIAGNOSIS — Z01818 Encounter for other preprocedural examination: Secondary | ICD-10-CM | POA: Diagnosis present

## 2023-11-03 DIAGNOSIS — M1612 Unilateral primary osteoarthritis, left hip: Secondary | ICD-10-CM | POA: Diagnosis not present

## 2023-11-03 DIAGNOSIS — M549 Dorsalgia, unspecified: Secondary | ICD-10-CM | POA: Diagnosis not present

## 2023-11-03 DIAGNOSIS — G8929 Other chronic pain: Secondary | ICD-10-CM | POA: Insufficient documentation

## 2023-11-03 DIAGNOSIS — Z87891 Personal history of nicotine dependence: Secondary | ICD-10-CM | POA: Insufficient documentation

## 2023-11-03 DIAGNOSIS — I2510 Atherosclerotic heart disease of native coronary artery without angina pectoris: Secondary | ICD-10-CM | POA: Diagnosis not present

## 2023-11-03 DIAGNOSIS — Z8673 Personal history of transient ischemic attack (TIA), and cerebral infarction without residual deficits: Secondary | ICD-10-CM | POA: Insufficient documentation

## 2023-11-03 DIAGNOSIS — J45909 Unspecified asthma, uncomplicated: Secondary | ICD-10-CM | POA: Insufficient documentation

## 2023-11-03 LAB — CBC
HCT: 38.5 % (ref 36.0–46.0)
Hemoglobin: 12.3 g/dL (ref 12.0–15.0)
MCH: 32.7 pg (ref 26.0–34.0)
MCHC: 31.9 g/dL (ref 30.0–36.0)
MCV: 102.4 fL — ABNORMAL HIGH (ref 80.0–100.0)
Platelets: 401 K/uL — ABNORMAL HIGH (ref 150–400)
RBC: 3.76 MIL/uL — ABNORMAL LOW (ref 3.87–5.11)
RDW: 13.2 % (ref 11.5–15.5)
WBC: 6.6 K/uL (ref 4.0–10.5)
nRBC: 0 % (ref 0.0–0.2)

## 2023-11-03 LAB — BASIC METABOLIC PANEL WITH GFR
Anion gap: 12 (ref 5–15)
BUN: 14 mg/dL (ref 8–23)
CO2: 24 mmol/L (ref 22–32)
Calcium: 9.6 mg/dL (ref 8.9–10.3)
Chloride: 102 mmol/L (ref 98–111)
Creatinine, Ser: 0.64 mg/dL (ref 0.44–1.00)
GFR, Estimated: >60 mL/min (ref >60)
Glucose, Bld: 93 mg/dL (ref 70–99)
Potassium: 4.6 mmol/L (ref 3.5–5.1)
Sodium: 138 mmol/L (ref 135–145)

## 2023-11-03 LAB — SURGICAL PCR SCREEN
MRSA, PCR: NEGATIVE
Staphylococcus aureus: NEGATIVE

## 2023-11-04 NOTE — Anesthesia Preprocedure Evaluation (Addendum)
 Anesthesia Evaluation  Patient identified by MRN, date of birth, ID band Patient awake    Reviewed: Allergy & Precautions, NPO status , Patient's Chart, lab work & pertinent test results, reviewed documented beta blocker date and time   History of Anesthesia Complications (+) PROLONGED EMERGENCE and history of anesthetic complications  Airway Mallampati: I  TM Distance: >3 FB     Dental no notable dental hx.    Pulmonary asthma , neg COPD, neg recent URI, former smoker, neg PE   breath sounds clear to auscultation       Cardiovascular (-) CAD, (-) Past MI and (-) Cardiac Stents  Rhythm:Regular Rate:Normal  IMPRESSIONS     1. No evidence of atrial level shunt on this exam. Negative agitated  saline injection.   2. Left ventricular ejection fraction by 3D volume is 62 %. The left  ventricle has normal function. The left ventricle has no regional wall  motion abnormalities. Left ventricular diastolic parameters were normal.  The average left ventricular global  longitudinal strain is -20.2 %. The global longitudinal strain is normal.   3. Right ventricular systolic function is normal. The right ventricular  size is normal. There is normal pulmonary artery systolic pressure. The  estimated right ventricular systolic pressure is 30.5 mmHg.   4. The mitral valve is grossly normal. Mild mitral valve regurgitation.  No evidence of mitral stenosis.   5. The aortic valve is tricuspid. There is mild calcification of the  aortic valve. There is mild thickening of the aortic valve. Aortic valve  regurgitation is mild. Aortic valve sclerosis is present, with no evidence  of aortic valve stenosis.   6. The inferior vena cava is normal in size with greater than 50%  respiratory variability, suggesting right atrial pressure of 3 mmHg.   7. Agitated saline contrast bubble study was negative, with no evidence  of any interatrial shunt.      Neuro/Psych neg Seizures  Neuromuscular disease    GI/Hepatic ,neg GERD  ,,(+) neg Cirrhosis        Endo/Other    Renal/GU Renal disease     Musculoskeletal  (+) Arthritis ,    Abdominal   Peds  Hematology   Anesthesia Other Findings   Reproductive/Obstetrics                              Anesthesia Physical Anesthesia Plan  ASA: 2  Anesthesia Plan: Spinal   Post-op Pain Management:    Induction: Intravenous  PONV Risk Score and Plan: 2 and Ondansetron  and Propofol  infusion  Airway Management Planned: Natural Airway and Simple Face Mask  Additional Equipment:   Intra-op Plan:   Post-operative Plan:   Informed Consent: I have reviewed the patients History and Physical, chart, labs and discussed the procedure including the risks, benefits and alternatives for the proposed anesthesia with the patient or authorized representative who has indicated his/her understanding and acceptance.     Dental advisory given  Plan Discussed with:   Anesthesia Plan Comments: (See PAT note from 9/3)         Anesthesia Quick Evaluation

## 2023-11-04 NOTE — Progress Notes (Signed)
 Case: [id number] Date/Time: 11/09/23 0825   Procedure: ARTHROPLASTY, HIP, TOTAL, ANTERIOR APPROACH (Left: Hip)   Anesthesia type: Spinal   Diagnosis: Primary osteoarthritis of left hip [M16.12]   Pre-op diagnosis: Left hip osteoarthritis   Location: [facility] ROOM 10 / [facility]   Surgeons: Ernie Cough, MD       DISCUSSION: Julie Tapia is a 73 yo female with PMH of former smoking, CAD (by CT), asthma, saccular aneurysm of the LICA, hx of TIA, arthritis, chronic back pain  Prior anesthesia complications include prolonged emergence.  Patient seen by cardiology on 09/30/2023 for preop clearance.  She is asymptomatic from a cardiac standpoint and has good functional status.  Cleared for surgery:  Preoperative cardiac exam: According to the Revised Cardiac Risk Index (RCRI), her Perioperative Risk of Major Cardiac Event is (%): 0.9. Her Functional Capacity in METs is: 6.27 according to the [facility] Activity Status Index (DASI). Therefore, based on ACC/AHA guidelines, patient would be at acceptable risk for the planned procedure without further cardiovascular testing.  Patient takes aspirin  325 mg daily for history of TIA, saccular aneurysm of LICA. This is not managed by allergy.  Recommendations were holding aspirin  prior to surgery should come from managing provider.  I will route this recommendation to the requesting party via Epic fax function.  Patient has known aneurysm of the left ICA.  This is followed with periodic [facility]As of the head at [facility].  Last imaging on 10/01/2023 was stable.  VS: BP 129/83   Pulse 67   Temp 36.9 C (Oral)   Resp 16   Ht 5' 8 (1.727 m)   Wt 60.8 kg   SpO2 99%   BMI 20.37 kg/m   PROVIDERS: Larnell Hamilton, MD   LABS: Labs reviewed: Acceptable for surgery. (all labs ordered are listed, but only abnormal results are displayed)  Labs Reviewed  CBC - Abnormal; Notable for the following components:      Result Value   RBC 3.76 (*)    MCV 102.4 (*)    Platelets 401  (*)    All other components within normal limits  SURGICAL PCR SCREEN  BASIC METABOLIC PANEL WITH GFR  TYPE AND SCREEN     IMAGES: CTA head 10/01/2023 ([facility]):  IMPRESSION: 1.  No acute intracranial abnormality. 2.  Stable left ICA aneurysm. 3.  No hemodynamically significant arterial stenosis in the neck.  EKG 09/30/2023: 6 Normal sinus rhythm, rate 71 Normal ECG  CV: ETT 07/15/2022:  Stress Findings A Bruce protocol stress test was performed. Exercise capacity was normal. Patient exercised for 6 min and 30 sec. Maximum HR of 155 bpm. MPHR 104.0 %. Peak METS 7.7 .  The patient experienced no angina during the test. The patient achieved the target heart rate. The patient requested the test to be stopped. The patient reported no symptoms during the stress test. Normal blood pressure and normal heart rate response noted during stress. Heart rate recovery was normal.  Echo 06/03/22  IMPRESSIONS    1. No evidence of atrial level shunt on this exam. Negative agitated saline injection.  2. Left ventricular ejection fraction by 3D volume is 62 %. The left ventricle has normal function. The left ventricle has no regional wall motion abnormalities. Left ventricular diastolic parameters were normal. The average left ventricular global longitudinal strain is -20.2 %. The global longitudinal strain is normal.  3. Right ventricular systolic function is normal. The right ventricular size is normal. There is normal pulmonary artery systolic pressure. The  estimated right ventricular systolic pressure is 30.5 mmHg.  4. The mitral valve is grossly normal. Mild mitral valve regurgitation. No evidence of mitral stenosis.  5. The aortic valve is tricuspid. There is mild calcification of the aortic valve. There is mild thickening of the aortic valve. Aortic valve regurgitation is mild. Aortic valve sclerosis is present, with no evidence of aortic valve stenosis.  6. The inferior vena cava is normal  in size with greater than 50% respiratory variability, suggesting right atrial pressure of 3 mmHg.  7. Agitated saline contrast bubble study was negative, with no evidence of any interatrial shunt.  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 tight chested-no inhalers in yrs.   Complication of anesthesia 01-16-11   Pt. very slow to awaken and was days before feeling like herself.    Past Surgical History:  Procedure Laterality Date   ABDOMINAL HYSTERECTOMY     BREAST CYST EXCISION  01-16-11   several excisions in the past   JOINT REPLACEMENT  01-16-11   Rt. great toe ball joint replacement-metal   REDUCTION MAMMAPLASTY  01-16-11   Bil. '86( problems with s/p surgical clot at rt. breast drainage site)   TOTAL HIP ARTHROPLASTY  01/20/2011   Procedure: TOTAL HIP ARTHROPLASTY ANTERIOR APPROACH;  Surgeon: Donnice JONETTA Car;  Location[facility];  Service: Orthopedics;  Laterality: Right;    MEDICATIONS:  albuterol (VENTOLIN HFA) 108 (90 Base) MCG/ACT inhaler   aspirin  325 MG tablet   diclofenac (VOLTAREN) 75 MG EC tablet   diclofenac Sodium (VOLTAREN) 1 % GEL   gabapentin  (NEURONTIN ) 100 MG capsule   Homeopathic Products (ARNICARE) GEL   lidocaine  (LIDODERM ) 5 %   pravastatin  (PRAVACHOL ) 40 MG tablet   No current facility-administered medications for this encounter.    Burnard CHRISTELLA Odis DEVONNA [facility] Short Stay/Anesthesiology [facility] Phone [phone] 11/04/2023 10:05 AM

## 2023-11-09 ENCOUNTER — Other Ambulatory Visit: Payer: Self-pay

## 2023-11-09 ENCOUNTER — Observation Stay (HOSPITAL_COMMUNITY)
Admission: RE | Admit: 2023-11-09 | Discharge: 2023-11-10 | Disposition: A | Discharge status: Home / Self Care | Attending: Orthopedic Surgery | Admitting: Orthopedic Surgery

## 2023-11-09 ENCOUNTER — Ambulatory Visit (HOSPITAL_COMMUNITY): Payer: Self-pay | Admitting: Medical

## 2023-11-09 ENCOUNTER — Encounter (HOSPITAL_COMMUNITY)
Admission: RE | Disposition: A | Discharge status: Home / Self Care | Payer: Self-pay | Source: Home / Self Care | Attending: Orthopedic Surgery

## 2023-11-09 ENCOUNTER — Ambulatory Visit (HOSPITAL_BASED_OUTPATIENT_CLINIC_OR_DEPARTMENT_OTHER): Payer: Self-pay

## 2023-11-09 ENCOUNTER — Ambulatory Visit (HOSPITAL_COMMUNITY)

## 2023-11-09 ENCOUNTER — Observation Stay (HOSPITAL_COMMUNITY)

## 2023-11-09 ENCOUNTER — Encounter (HOSPITAL_COMMUNITY): Payer: Self-pay | Admitting: Orthopedic Surgery

## 2023-11-09 DIAGNOSIS — Z96643 Presence of artificial hip joint, bilateral: Secondary | ICD-10-CM | POA: Diagnosis not present

## 2023-11-09 DIAGNOSIS — J45909 Unspecified asthma, uncomplicated: Secondary | ICD-10-CM | POA: Diagnosis not present

## 2023-11-09 DIAGNOSIS — M1612 Unilateral primary osteoarthritis, left hip: Secondary | ICD-10-CM

## 2023-11-09 DIAGNOSIS — Z96641 Presence of right artificial hip joint: Secondary | ICD-10-CM | POA: Diagnosis not present

## 2023-11-09 DIAGNOSIS — M25552 Pain in left hip: Secondary | ICD-10-CM | POA: Diagnosis present

## 2023-11-09 DIAGNOSIS — Z471 Aftercare following joint replacement surgery: Secondary | ICD-10-CM | POA: Diagnosis not present

## 2023-11-09 DIAGNOSIS — Z87891 Personal history of nicotine dependence: Secondary | ICD-10-CM | POA: Diagnosis not present

## 2023-11-09 DIAGNOSIS — Z96642 Presence of left artificial hip joint: Secondary | ICD-10-CM

## 2023-11-09 DIAGNOSIS — Z7982 Long term (current) use of aspirin: Secondary | ICD-10-CM | POA: Diagnosis not present

## 2023-11-09 HISTORY — PX: TOTAL HIP ARTHROPLASTY: SHX124

## 2023-11-09 LAB — TYPE AND SCREEN
ABO/RH(D): A POS
Antibody Screen: NEGATIVE

## 2023-11-09 SURGERY — ARTHROPLASTY, HIP, TOTAL, ANTERIOR APPROACH
Anesthesia: Spinal | Site: Hip | Laterality: Left

## 2023-11-09 MED ORDER — METHOCARBAMOL 500 MG PO TABS
500.0000 mg | ORAL_TABLET | Freq: Four times a day (QID) | ORAL | Status: DC | PRN
  Administered 2023-11-09 – 2023-11-10 (×2): 500 mg via ORAL
  Filled 2023-11-09 (×2): qty 1

## 2023-11-09 MED ORDER — PHENYLEPHRINE 80 MCG/ML (10ML) SYRINGE FOR IV PUSH (FOR BLOOD PRESSURE SUPPORT)
PREFILLED_SYRINGE | INTRAVENOUS | Status: AC
  Filled 2023-11-09: qty 10

## 2023-11-09 MED ORDER — ORAL CARE MOUTH RINSE: 15.0000 mL | Freq: Once | OROMUCOSAL | Status: AC

## 2023-11-09 MED ORDER — OXYCODONE HCL 5 MG/5ML PO SOLN: 5.0000 mg | Freq: Once | ORAL | Status: AC | PRN

## 2023-11-09 MED ORDER — ALUM & MAG HYDROXIDE-SIMETH 200-200-20 MG/5ML PO SUSP: 30.0000 mL | ORAL | Status: DC | PRN

## 2023-11-09 MED ORDER — HYDROMORPHONE HCL 1 MG/ML IJ SOLN: 0.5000 mg | INTRAMUSCULAR | Status: DC | PRN

## 2023-11-09 MED ORDER — SODIUM CHLORIDE 0.9 % IV SOLN: INTRAVENOUS | Status: DC

## 2023-11-09 MED ORDER — DIPHENHYDRAMINE HCL 12.5 MG/5ML PO ELIX
12.5000 mg | ORAL_SOLUTION | ORAL | Status: DC | PRN
  Administered 2023-11-09: 12.5 mg via ORAL
  Filled 2023-11-09: qty 10

## 2023-11-09 MED ORDER — BISACODYL 10 MG RE SUPP: 10.0000 mg | Freq: Every day | RECTAL | Status: DC | PRN

## 2023-11-09 MED ORDER — EPHEDRINE SULFATE-NACL 50-0.9 MG/10ML-% IV SOSY
PREFILLED_SYRINGE | INTRAVENOUS | Status: DC | PRN
  Administered 2023-11-09 (×5): 2.5 mg via INTRAVENOUS

## 2023-11-09 MED ORDER — PHENOL 1.4 % MT LIQD: 1.0000 | OROMUCOSAL | Status: DC | PRN

## 2023-11-09 MED ORDER — TRANEXAMIC ACID-NACL 1000-0.7 MG/100ML-% IV SOLN
1000.0000 mg | INTRAVENOUS | Status: AC
  Administered 2023-11-09: 1000 mg via INTRAVENOUS
  Filled 2023-11-09: qty 100

## 2023-11-09 MED ORDER — OXYCODONE HCL 5 MG PO TABS
5.0000 mg | ORAL_TABLET | Freq: Once | ORAL | Status: AC | PRN
  Administered 2023-11-09: 5 mg via ORAL

## 2023-11-09 MED ORDER — ALBUTEROL SULFATE (2.5 MG/3ML) 0.083% IN NEBU: 2.5000 mg | INHALATION_SOLUTION | Freq: Four times a day (QID) | RESPIRATORY_TRACT | Status: DC | PRN

## 2023-11-09 MED ORDER — STERILE WATER FOR IRRIGATION IR SOLN
Status: DC | PRN
  Administered 2023-11-09: 1000 mL

## 2023-11-09 MED ORDER — ASPIRIN 81 MG PO CHEW
81.0000 mg | CHEWABLE_TABLET | Freq: Two times a day (BID) | ORAL | Status: DC
  Administered 2023-11-09 – 2023-11-10 (×2): 81 mg via ORAL
  Filled 2023-11-09 (×2): qty 1

## 2023-11-09 MED ORDER — ONDANSETRON HCL 4 MG/2ML IJ SOLN
INTRAMUSCULAR | Status: DC | PRN
  Administered 2023-11-09: 4 mg via INTRAVENOUS

## 2023-11-09 MED ORDER — POLYETHYLENE GLYCOL 3350 17 G PO PACK
17.0000 g | PACK | Freq: Two times a day (BID) | ORAL | Status: DC
  Filled 2023-11-09 (×2): qty 1

## 2023-11-09 MED ORDER — PRAVASTATIN SODIUM 20 MG PO TABS
40.0000 mg | ORAL_TABLET | Freq: Every evening | ORAL | Status: DC
  Administered 2023-11-09: 40 mg via ORAL
  Filled 2023-11-09: qty 2

## 2023-11-09 MED ORDER — POVIDONE-IODINE 10 % EX SWAB: 2.0000 | Freq: Once | CUTANEOUS | Status: DC

## 2023-11-09 MED ORDER — OXYCODONE HCL 5 MG PO TABS
ORAL_TABLET | ORAL | Status: AC
  Filled 2023-11-09: qty 1

## 2023-11-09 MED ORDER — PROPOFOL 500 MG/50ML IV EMUL
INTRAVENOUS | Status: DC | PRN
  Administered 2023-11-09: 30 ug/kg/min via INTRAVENOUS

## 2023-11-09 MED ORDER — OXYCODONE HCL 5 MG PO TABS
5.0000 mg | ORAL_TABLET | ORAL | Status: DC | PRN
  Administered 2023-11-09 – 2023-11-10 (×2): 10 mg via ORAL
  Administered 2023-11-10: 5 mg via ORAL
  Filled 2023-11-09: qty 1
  Filled 2023-11-09: qty 2

## 2023-11-09 MED ORDER — PHENYLEPHRINE 80 MCG/ML (10ML) SYRINGE FOR IV PUSH (FOR BLOOD PRESSURE SUPPORT)
PREFILLED_SYRINGE | INTRAVENOUS | Status: DC | PRN
  Administered 2023-11-09: 80 ug via INTRAVENOUS
  Administered 2023-11-09 (×2): 100 ug via INTRAVENOUS
  Administered 2023-11-09 (×5): 80 ug via INTRAVENOUS
  Administered 2023-11-09: 100 ug via INTRAVENOUS

## 2023-11-09 MED ORDER — CEFAZOLIN SODIUM-DEXTROSE 2-4 GM/100ML-% IV SOLN
2.0000 g | INTRAVENOUS | Status: AC
Start: 2023-11-09 — End: 2023-11-09
  Administered 2023-11-09: 2 g via INTRAVENOUS
  Filled 2023-11-09: qty 100

## 2023-11-09 MED ORDER — SODIUM CHLORIDE (PF) 0.9 % IJ SOLN
INTRAMUSCULAR | Status: AC
  Filled 2023-11-09: qty 30

## 2023-11-09 MED ORDER — ONDANSETRON HCL 4 MG PO TABS
4.0000 mg | ORAL_TABLET | Freq: Four times a day (QID) | ORAL | Status: DC | PRN
  Filled 2023-11-09: qty 1

## 2023-11-09 MED ORDER — METOCLOPRAMIDE HCL 5 MG PO TABS: 5.0000 mg | ORAL_TABLET | Freq: Three times a day (TID) | ORAL | Status: DC | PRN

## 2023-11-09 MED ORDER — PROPOFOL 1000 MG/100ML IV EMUL
INTRAVENOUS | Status: AC
  Filled 2023-11-09: qty 100

## 2023-11-09 MED ORDER — METOCLOPRAMIDE HCL 5 MG/ML IJ SOLN: 5.0000 mg | Freq: Three times a day (TID) | INTRAMUSCULAR | Status: DC | PRN

## 2023-11-09 MED ORDER — MEPIVACAINE HCL (PF) 2 % IJ SOLN
INTRAMUSCULAR | Status: DC | PRN
  Administered 2023-11-09: 3 mL via INTRATHECAL

## 2023-11-09 MED ORDER — SODIUM CHLORIDE (PF) 0.9 % IJ SOLN
INTRAMUSCULAR | Status: DC | PRN
  Administered 2023-11-09: 61 mL

## 2023-11-09 MED ORDER — LACTATED RINGERS IV SOLN: INTRAVENOUS | Status: DC

## 2023-11-09 MED ORDER — MENTHOL 3 MG MT LOZG: 1.0000 | LOZENGE | OROMUCOSAL | Status: DC | PRN

## 2023-11-09 MED ORDER — GABAPENTIN 100 MG PO CAPS
200.0000 mg | ORAL_CAPSULE | Freq: Every day | ORAL | Status: DC
  Administered 2023-11-09: 200 mg via ORAL
  Filled 2023-11-09: qty 2

## 2023-11-09 MED ORDER — ONDANSETRON HCL 4 MG/2ML IJ SOLN: 4.0000 mg | Freq: Once | INTRAMUSCULAR | Status: DC | PRN

## 2023-11-09 MED ORDER — ACETAMINOPHEN 10 MG/ML IV SOLN: 1000.0000 mg | Freq: Once | INTRAVENOUS | Status: DC | PRN

## 2023-11-09 MED ORDER — EPHEDRINE 5 MG/ML INJ
INTRAVENOUS | Status: AC
  Filled 2023-11-09: qty 5

## 2023-11-09 MED ORDER — KETOROLAC TROMETHAMINE 30 MG/ML IJ SOLN
INTRAMUSCULAR | Status: AC
  Filled 2023-11-09: qty 1

## 2023-11-09 MED ORDER — CEFAZOLIN SODIUM-DEXTROSE 2-4 GM/100ML-% IV SOLN
2.0000 g | Freq: Four times a day (QID) | INTRAVENOUS | Status: AC
  Administered 2023-11-09 (×2): 2 g via INTRAVENOUS
  Filled 2023-11-09 (×2): qty 100

## 2023-11-09 MED ORDER — ONDANSETRON HCL 4 MG/2ML IJ SOLN: 4.0000 mg | Freq: Four times a day (QID) | INTRAMUSCULAR | Status: DC | PRN

## 2023-11-09 MED ORDER — OXYCODONE HCL 5 MG PO TABS
10.0000 mg | ORAL_TABLET | ORAL | Status: DC | PRN
  Administered 2023-11-09 – 2023-11-10 (×3): 10 mg via ORAL
  Filled 2023-11-09 (×4): qty 2

## 2023-11-09 MED ORDER — DEXAMETHASONE SODIUM PHOSPHATE 10 MG/ML IJ SOLN: 8.0000 mg | Freq: Once | INTRAMUSCULAR | Status: DC

## 2023-11-09 MED ORDER — 0.9 % SODIUM CHLORIDE (POUR BTL) OPTIME
TOPICAL | Status: DC | PRN
  Administered 2023-11-09: 1000 mL

## 2023-11-09 MED ORDER — BUPIVACAINE-EPINEPHRINE (PF) 0.25% -1:200000 IJ SOLN
INTRAMUSCULAR | Status: AC
  Filled 2023-11-09: qty 30

## 2023-11-09 MED ORDER — FENTANYL CITRATE PF 50 MCG/ML IJ SOSY: 25.0000 ug | PREFILLED_SYRINGE | INTRAMUSCULAR | Status: DC | PRN

## 2023-11-09 MED ORDER — METHOCARBAMOL 1000 MG/10ML IJ SOLN: 500.0000 mg | Freq: Four times a day (QID) | INTRAMUSCULAR | Status: DC | PRN

## 2023-11-09 MED ORDER — ACETAMINOPHEN 500 MG PO TABS
1000.0000 mg | ORAL_TABLET | Freq: Four times a day (QID) | ORAL | Status: DC
  Administered 2023-11-09 – 2023-11-10 (×4): 1000 mg via ORAL
  Filled 2023-11-09 (×4): qty 2

## 2023-11-09 MED ORDER — CHLORHEXIDINE GLUCONATE 0.12 % MT SOLN
15.0000 mL | Freq: Once | OROMUCOSAL | Status: AC
  Administered 2023-11-09: 15 mL via OROMUCOSAL

## 2023-11-09 MED ORDER — SENNA 8.6 MG PO TABS
2.0000 | ORAL_TABLET | Freq: Every day | ORAL | Status: DC
  Filled 2023-11-09: qty 2

## 2023-11-09 MED ORDER — TRANEXAMIC ACID-NACL 1000-0.7 MG/100ML-% IV SOLN
1000.0000 mg | Freq: Once | INTRAVENOUS | Status: AC
  Administered 2023-11-09: 1000 mg via INTRAVENOUS
  Filled 2023-11-09: qty 100

## 2023-11-09 SURGICAL SUPPLY — 37 items
BAG COUNTER SPONGE SURGICOUNT (BAG) IMPLANT
BAG ZIPLOCK 12X15 (MISCELLANEOUS) ×1 IMPLANT
BLADE SAG 18X100X1.27 (BLADE) ×1 IMPLANT
COVER PERINEAL POST (MISCELLANEOUS) ×1 IMPLANT
COVER SURGICAL LIGHT HANDLE (MISCELLANEOUS) ×1 IMPLANT
CUP ACETBLR 54 OD PINNACLE (Hips) IMPLANT
DERMABOND ADVANCED .7 DNX12 (GAUZE/BANDAGES/DRESSINGS) ×1 IMPLANT
DRAPE FOOT SWITCH (DRAPES) ×1 IMPLANT
DRAPE STERI IOBAN 125X83 (DRAPES) ×1 IMPLANT
DRAPE U-SHAPE 47X51 STRL (DRAPES) ×2 IMPLANT
DRESSING AQUACEL AG SP 3.5X10 (GAUZE/BANDAGES/DRESSINGS) ×1 IMPLANT
DURAPREP 26ML APPLICATOR (WOUND CARE) ×1 IMPLANT
ELECT REM PT RETURN 15FT ADLT (MISCELLANEOUS) ×1 IMPLANT
GLOVE BIO SURGEON STRL SZ 6 (GLOVE) ×1 IMPLANT
GLOVE BIOGEL PI IND STRL 6.5 (GLOVE) ×1 IMPLANT
GLOVE BIOGEL PI IND STRL 7.5 (GLOVE) ×1 IMPLANT
GLOVE ORTHO TXT STRL SZ7.5 (GLOVE) ×2 IMPLANT
GOWN STRL REUS W/ TWL LRG LVL3 (GOWN DISPOSABLE) ×2 IMPLANT
HEAD CERAMIC DELTA 36 PLUS 1.5 (Hips) IMPLANT
HOLDER FOLEY CATH W/STRAP (MISCELLANEOUS) ×1 IMPLANT
KIT TURNOVER KIT A (KITS) ×1 IMPLANT
LINER NEUTRAL 54X36MM PLUS 4 (Hips) IMPLANT
MANIFOLD NEPTUNE II (INSTRUMENTS) ×1 IMPLANT
NDL SAFETY ECLIPSE 18X1.5 (NEEDLE) ×1 IMPLANT
PACK ANTERIOR HIP CUSTOM (KITS) ×1 IMPLANT
PENCIL SMOKE EVACUATOR (MISCELLANEOUS) ×1 IMPLANT
SCREW 6.5MMX30MM (Screw) IMPLANT
STEM FEMORAL SZ6 HIGH ACTIS (Stem) IMPLANT
SUT MNCRL AB 4-0 PS2 18 (SUTURE) ×1 IMPLANT
SUT VIC AB 1 CT1 36 (SUTURE) ×3 IMPLANT
SUT VIC AB 2-0 CT1 TAPERPNT 27 (SUTURE) ×2 IMPLANT
SUTURE STRATFX 0 PDS 27 VIOLET (SUTURE) ×1 IMPLANT
SYR 3ML LL SCALE MARK (SYRINGE) ×1 IMPLANT
TOWEL GREEN STERILE FF (TOWEL DISPOSABLE) ×1 IMPLANT
TRAY FOLEY MTR SLVR 16FR STAT (SET/KITS/TRAYS/PACK) ×1 IMPLANT
TUBE SUCTION HIGH CAP CLEAR NV (SUCTIONS) ×1 IMPLANT
WATER STERILE IRR 1000ML POUR (IV SOLUTION) ×1 IMPLANT

## 2023-11-09 NOTE — Anesthesia Postprocedure Evaluation (Signed)
 Anesthesia Post Note  Patient: Julie Tapia  Procedure(s) Performed: ARTHROPLASTY, HIP, TOTAL, ANTERIOR APPROACH (Left: Hip)     Patient location during evaluation: PACU Anesthesia Type: Spinal Level of consciousness: awake and alert Pain management: pain level controlled Vital Signs Assessment: post-procedure vital signs reviewed and stable Respiratory status: spontaneous breathing, nonlabored ventilation, respiratory function stable and patient connected to nasal cannula oxygen Cardiovascular status: blood pressure returned to baseline and stable Postop Assessment: no apparent nausea or vomiting Anesthetic complications: no   No notable events documented.  Last Vitals:  Vitals:   11/09/23 1215 11/09/23 1230  BP: 110/86 115/84  Pulse: 69 68  Resp: 16 15  Temp:  (!) 36.1 C  SpO2: 100% 98%    Last Pain:  Vitals:   11/09/23 1215  TempSrc:   PainSc: 3                  Lynwood MARLA Cornea

## 2023-11-09 NOTE — Discharge Instructions (Signed)

## 2023-11-09 NOTE — Care Plan (Signed)
 Ortho Bundle Case Management Note  Patient Details  Name: Julie Tapia MRN: [id number] Date of Birth: [birth year]  L THA on 11/09/23  DCP: Home with daughter DME: No needs PT: HEP                   DME Arranged:  N/A DME Agency:  NA  HH Arranged:    HH Agency:     Additional Comments: Please contact me with any questions of if this plan should need to change.  Lyle Pepper, [place] [facility] [phone]   11/09/2023, 12:22 PM

## 2023-11-09 NOTE — H&P (Signed)
 TOTAL HIP ADMISSION H&P  Patient is admitted for left total hip arthroplasty.  Therapy Plans: HEP Disposition: Home with daughter Planned DVT Prophylaxis: aspirin  81mg  BID DME needed: none PCP: Dr. Larnell - clearance received Cardio: Dr. Lavona - clearance received (aortic/mitral regurg, CAD, TIA) Neurosurgery: Dr. Phil (verbal clearance) TXA: IV Allergies: sulfa/PCN/cipro/erythromycin - anaphylaxis (TOLERATES ONLY CEPHALOSPORINS), cyclobenzaprine  - , lidocaine  - itching, methylprednisolone  - Anesthesia Concerns: none BMI: 20.4 Last HgbA1c: Not diabetic   Other: ** - Hx of saccular anerysm managed by Dr Phil - on asa 325 daily - overnight stay as she wants IV pain medication - oxycodone , tylenol , methocarbamol  (had severe bloating with flexeril )  Subjective:  Chief Complaint: left hip pain  HPI: Julie Tapia, 73 y.o. female, has a history of pain and functional disability in the left hip(s) due to arthritis and patient has failed non-surgical conservative treatments for greater than 12 weeks to include NSAID's and/or analgesics and activity modification.  Onset of symptoms was gradual starting 2 years ago with gradually worsening course since that time.The patient noted no past surgery on the left hip(s).  Patient currently rates pain in the left hip at 9 out of 10 with activity. Patient has worsening of pain with activity and weight bearing, pain that interfers with activities of daily living, and pain with passive range of motion. Patient has evidence of joint space narrowing by imaging studies. This condition presents safety issues increasing the risk of falls. There is no current active infection.  Patient Active Problem List   Diagnosis Date Noted   Myofascial pain syndrome of lumbar spine 05/25/2023   Chronic bilateral low back pain with bilateral sciatica 05/25/2023   Muscle cramps 05/25/2023   Nerve pain due to spinal stenosis 05/25/2023   Benign fasciculations  05/25/2023   Cerebral aneurysm 01/12/2021   PFO (patent foramen ovale) 01/12/2021   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 tight chested-no inhalers in yrs.   Complication of anesthesia 01-16-11   Pt. very slow to awaken and was days before feeling like herself.    Past Surgical History:  Procedure Laterality Date   ABDOMINAL HYSTERECTOMY     BREAST CYST EXCISION  01-16-11   several excisions in the past   JOINT REPLACEMENT  01-16-11   Rt. great toe ball joint replacement-metal   REDUCTION MAMMAPLASTY  01-16-11   Bil. '86( problems with s/p surgical clot at rt. breast drainage site)   TOTAL HIP ARTHROPLASTY  01/20/2011   Procedure: TOTAL HIP ARTHROPLASTY ANTERIOR APPROACH;  Surgeon: Donnice JONETTA Car;  Location[facility];  Service: Orthopedics;  Laterality: Right;    Current Facility-Administered Medications  Medication Dose Route Frequency Provider Last Rate Last Admin   ceFAZolin  (ANCEF ) IVPB 2g/100 mL premix  2 g Intravenous On Call to OR Patti Rosina SAUNDERS, PA-C       chlorhexidine  (PERIDEX ) 0.12 % solution 15 mL  15 mL Mouth/Throat Once Ellender, Bernardino SQUIBB, MD       Or   Oral care mouth rinse  15 mL Mouth Rinse Once Ellender, Bernardino SQUIBB, MD       dexamethasone  (DECADRON ) injection 8 mg  8 mg  Intravenous Once Patti Rosina SAUNDERS, PA-C       lactated ringers  infusion   Intravenous Continuous Ellender, Bernardino SQUIBB, MD       povidone-iodine  10 % swab 2 Application  2 Application Topical Once Patti Rosina SAUNDERS, PA-C       tranexamic acid  (CYKLOKAPRON ) IVPB 1,000 mg  1,000 mg Intravenous To OR Patti Rosina SAUNDERS, PA-C       Allergies  Allergen Reactions   Dexamethasone  Other  (See Comments)    Patient feels that spine, leg and eye problems got worse after IM injection.  dexamethasone    Erythromycin Anaphylaxis    ALL MYCINS   Other Anaphylaxis    ALL MYCIN MEDICATIONS   Ciprofloxacin Hcl Itching and Swelling   Contrast Media [Iodinated Contrast Media] Itching and Swelling    Pt states she can tolerate this with a benadryl  post use and has used contrast in the past.   Lidocaine  Itching and Other (See Comments)    Pt states that this is not a true allergy and can tolerate this     Methylprednisolone      Patient feels that spine, leg and eye problems got worse after IM injection.   Penicillins Other (See Comments)    Felt like had Chickenpox 01-16-11 Keflex  is antibiotic of choice per pt.-[facility]   Sulfa Drugs Cross Reactors Itching, Swelling and Other (See Comments)    All sulfa drugs    Social History   Tobacco Use   Smoking status: Former    Current packs/day: 0.00    Types: Cigarettes    Quit date: 01/16/1984    Years since quitting: 39.8   Smokeless tobacco: Never  Substance Use Topics   Alcohol  use: Yes    Alcohol /week: 1.0 standard drink of alcohol     Types: 1 Glasses of wine per week    Comment: occas    Family History  Problem Relation Age of Onset   Heart disease Mother        Pacemaker     Review of Systems  Constitutional:  Negative for chills and fever.  Respiratory:  Negative for cough and shortness of breath.   Cardiovascular:  Negative for chest pain.  Gastrointestinal:  Negative for nausea and vomiting.  Musculoskeletal:  Positive for arthralgias.     Objective:  Physical Exam Musculoskeletal: Left hip exam: Range of motion of her left hip reveals limitations with hip flexion internal rotation to 5 to 10 degrees with pelvic tilting and pain in the groin External rotation close to 30 degrees with reproducible anterior lateral pain Mild weakness with active hip flexion and abduction due to pain  Right hip exam: Fluid  passive range of motion from 20 degrees of internal to 30 degrees of external rotation without pain Active hip flexion with 5/5 strength No lower extremity edema, erythema or calf tenderness  Vital signs in last 24 hours: Temp:  [98.4 F (36.9 C)] 98.4 F (36.9 C) (09/09 0635) Pulse Rate:  [70] 70 (09/09 0635) Resp:  [16] 16 (09/09 0635) BP: (132)/(69) 132/69 (09/09 0635) SpO2:  [98 %] 98 % (09/09 0635)  Labs:   Estimated body mass index is 20.37 kg/m as calculated from the following:   Height as of 11/03/23: 5' 8 (1.727 m).   Weight as of 11/03/23: 60.8 kg.   Imaging Review Plain radiographs demonstrate severe degenerative joint disease of the left hip(s). The bone quality appears to be adequate for age and reported activity level.  Assessment/Plan:  End stage arthritis, left hip(s)  The patient history, physical examination, clinical judgement of the provider and imaging studies are consistent with end stage degenerative joint disease of the left hip(s) and total hip arthroplasty is deemed medically necessary. The treatment options including medical management, injection therapy, arthroscopy and arthroplasty were discussed at length. The risks and benefits of total hip arthroplasty were presented and reviewed. The risks due to aseptic loosening, infection, stiffness, dislocation/subluxation,  thromboembolic complications and other imponderables were discussed.  The patient acknowledged the explanation, agreed to proceed with the plan and consent was signed. Patient is being admitted for inpatient treatment for surgery, pain control, PT, OT, prophylactic antibiotics, VTE prophylaxis, progressive ambulation and ADL's and discharge planning.The patient is planning to be discharged home.   Rosina Calin, PA-C Orthopedic Surgery [facility] [place] [phone]

## 2023-11-09 NOTE — Plan of Care (Signed)

## 2023-11-09 NOTE — Evaluation (Signed)
 Physical Therapy Evaluation Patient Details Name: Julie Tapia MRN: [id number] DOB: 1950-04-28 Today's Date: 11/09/2023  History of Present Illness  73 yo S/P  LDATHA, PMH:R DA THA, aneurysm  Clinical Impression    The patient  ambulated x  11' with RW and Contact guard. Patient  reports being up and OOB is relieving to her back pain.  Patient should progress to return home with family support.  Pt admitted with above diagnosis.  Pt currently with functional limitations due to the deficits listed below (see PT Problem List). Pt will benefit from acute skilled PT to increase their independence and safety with mobility to allow discharge.           If plan is discharge home, recommend the following: A little help with walking and/or transfers;A little help with bathing/dressing/bathroom;Assist for transportation;Help with stairs or ramp for entrance   Can travel by private vehicle        Equipment Recommendations None recommended by PT  Recommendations for Other Services       Functional Status Assessment Patient has had a recent decline in their functional status and demonstrates the ability to make significant improvements in function in a reasonable and predictable amount of time.     Precautions / Restrictions Precautions Precautions: Fall Restrictions LLE Weight Bearing Per Provider Order: Weight bearing as tolerated      Mobility  Bed Mobility Overal bed mobility: Needs Assistance Bed Mobility: Supine to Sit     Supine to sit: Supervision     General bed mobility comments: used  belt to self assist Lle to bed edge    Transfers Overall transfer level: Needs assistance Equipment used: Rolling walker (2 wheels) Transfers: Sit to/from Stand Sit to Stand: Contact guard assist           General transfer comment: cues for hand placement    Ambulation/Gait Ambulation/Gait assistance: Contact guard assist Gait Distance (Feet): 80 Feet Assistive device:  Rolling walker (2 wheels) Gait Pattern/deviations: Step-through pattern Gait velocity: decr     General Gait Details: cues for sequence  Stairs            Wheelchair Mobility     Tilt Bed    Modified Rankin (Stroke Patients Only)       Balance Overall balance assessment: Mild deficits observed, not formally tested                                           Pertinent Vitals/Pain Pain Assessment Pain Assessment: 0-10 Pain Score: 8  Pain Location: back, Pain Descriptors / Indicators: Discomfort Pain Intervention(s): Monitored during session, Patient requesting pain meds-RN notified    Home Living Family/patient expects to be discharged to:: (P) Private residence Living Arrangements: (P) Children Available Help at Discharge: (P) Family Type of Home: (P) House Home Access: (P) Stairs to enter Entrance Stairs-Rails: Left Entrance Stairs-Number of Steps: 7 Alternate Level Stairs-Number of Steps: 15 Home Layout: One level Home Equipment: Agricultural consultant (2 wheels);Shower seat      Prior Function Prior Level of Function : Independent/Modified Independent                     Extremity/Trunk Assessment   Upper Extremity Assessment Upper Extremity Assessment: Overall WFL for tasks assessed    Lower Extremity Assessment Lower Extremity Assessment: LLE deficits/detail LLE Deficits / Details: able to flex  hip and knee in supine    Cervical / Trunk Assessment Cervical / Trunk Assessment: Normal  Communication   Communication Communication: No apparent difficulties    Cognition Arousal: Alert Behavior During Therapy: WFL for tasks assessed/performed   PT - Cognitive impairments: No apparent impairments                         Following commands: Intact       Cueing       General Comments      Exercises Total Joint Exercises Ankle Circles/Pumps: AROM, Both Heel Slides: AAROM, Left, 5 reps   Assessment/Plan     PT Assessment Patient needs continued PT services  PT Problem List Decreased strength;Decreased range of motion;Decreased knowledge of precautions;Decreased mobility       PT Treatment Interventions DME instruction;Gait training;Functional mobility training;Stair training;Therapeutic activities;Therapeutic exercise;Patient/family education    PT Goals (Current goals can be found in the Care Plan section)  Acute Rehab PT Goals Patient Stated Goal: go home PT Goal Formulation: With patient/family Time For Goal Achievement: 11/23/23 Potential to Achieve Goals: Good    Frequency 7X/week     Co-evaluation               AM-PAC PT 6 Clicks Mobility  Outcome Measure Help needed turning from your back to your side while in a flat bed without using bedrails?: A Little Help needed moving from lying on your back to sitting on the side of a flat bed without using bedrails?: A Little Help needed moving to and from a bed to a chair (including a wheelchair)?: A Little Help needed standing up from a chair using your arms (e.g., wheelchair or bedside chair)?: A Little Help needed to walk in hospital room?: A Little Help needed climbing 3-5 steps with a railing? : A Little 6 Click Score: 18    End of Session Equipment Utilized During Treatment: Gait belt Activity Tolerance: Patient tolerated treatment well Patient left: in chair;with call bell/phone within reach;with family/visitor present Nurse Communication: Mobility status PT Visit Diagnosis: Unsteadiness on feet (R26.81);Pain;Muscle weakness (generalized) (M62.81) Pain - Right/Left: Left Pain - part of body: Hip    Time: 8389-8364 PT Time Calculation (min) (ACUTE ONLY): 25 min   Charges:   PT Evaluation $PT Eval Low Complexity: 1 Low PT Treatments $Gait Training: 8-22 mins PT General Charges $$ ACUTE PT VISIT: 1 Visit         Darice Potters PT Acute Rehabilitation Services Office [phone]   Potters Darice Norris 11/09/2023, 4:43 PM

## 2023-11-09 NOTE — Anesthesia Procedure Notes (Signed)
 Spinal  Patient location during procedure: OR Start time: 11/09/2023 8:35 AM End time: 11/09/2023 8:37 AM Reason for block: surgical anesthesia Staffing Performed: anesthesiologist  Anesthesiologist: Keneth Lynwood POUR, MD Performed by: Keneth Lynwood POUR, MD Authorized by: Keneth Lynwood POUR, MD   Preanesthetic Checklist Completed: patient identified, IV checked, site marked, risks and benefits discussed, surgical consent, monitors and equipment checked, pre-op evaluation and timeout performed Spinal Block Patient position: sitting Prep: DuraPrep Patient monitoring: heart rate, cardiac monitor, continuous pulse ox and blood pressure Approach: midline Location: L3-4 Injection technique: single-shot Needle Needle type: Sprotte  Needle gauge: 24 G Needle length: 9 cm Assessment Sensory level: T4 Events: CSF return

## 2023-11-09 NOTE — Transfer of Care (Signed)
 Immediate Anesthesia Transfer of Care Note  Patient: Julie Tapia  Procedure(s) Performed: ARTHROPLASTY, HIP, TOTAL, ANTERIOR APPROACH (Left: Hip)  Patient Location: PACU  Anesthesia Type:Spinal  Level of Consciousness: awake  Airway & Oxygen Therapy: Patient Spontanous Breathing  Post-op Assessment: Report given to RN and Post -op Vital signs reviewed and stable  Post vital signs: Reviewed and stable  Last Vitals:  Vitals Value Taken Time  BP    Temp    Pulse 79 11/09/23 10:03  Resp 18 11/09/23 10:03  SpO2 99 % 11/09/23 10:03  Vitals shown include unfiled device data.  Last Pain:  Vitals:   11/09/23 0635  TempSrc: Oral         Complications: No notable events documented.

## 2023-11-09 NOTE — Op Note (Signed)
 NAME:  Julie Tapia NO.: [id number]      MEDICAL RECORD NO.: [id number]      FACILITY:  [facility]      PHYSICIAN:  Donnice JONETTA Car  DATE OF BIRTH:  10/06/1950     DATE OF PROCEDURE:  11/09/2023                                 OPERATIVE REPORT         PREOPERATIVE DIAGNOSIS: Left  hip osteoarthritis.      POSTOPERATIVE DIAGNOSIS:  Left hip osteoarthritis.      PROCEDURE:  Left total hip replacement through an anterior approach   utilizing DePuy THR system, component size 54 mm pinnacle cup, a size 36+4 neutral   Altrex liner, a size 6 Hi Actis stem with a 36+1.5 delta ceramic   ball.      SURGEON:  Donnice JONETTA. Car, M.D.      ASSISTANT:  Randine Ricks, PA-C     ANESTHESIA:  Spinal.      SPECIMENS:  None.      COMPLICATIONS:  None.      BLOOD LOSS:  400 cc     DRAINS:  None.      INDICATION OF THE PROCEDURE:  [name] is a 73 y.o. female who had   presented to office for evaluation of left hip pain.  Radiographs revealed   progressive degenerative changes with bone-on-bone   articulation of the  hip joint, including subchondral cystic changes and osteophytes.  The patient had painful limited range of   motion significantly affecting their overall quality of life and function.  The patient was failing to    respond to conservative measures including medications and/or injections and activity modification and at this point was ready   to proceed with more definitive measures.  Consent was obtained for   benefit of pain relief.  Specific risks of infection, DVT, component   failure, dislocation, neurovascular injury, and need for revision surgery were reviewed in the office.     PROCEDURE IN DETAIL:  The patient was brought to operative theater.   Once adequate anesthesia, preoperative antibiotics, 2 gm of Ancef , 1 gm of Tranexamic Acid , and 10 mg of Decadron  were administered, the patient was positioned supine on the [employer]  table.  Once the patient was safely positioned with adequate padding of boney prominences we predraped out the hip, and used fluoroscopy to confirm orientation of the pelvis.      The left hip was then prepped and draped from proximal iliac crest to   mid thigh with a shower curtain technique.      Time-out was performed identifying the patient, planned procedure, and the appropriate extremity.     An incision was then made 2 cm lateral to the   anterior superior iliac spine extending over the orientation of the   tensor fascia lata muscle and sharp dissection was carried down to the   fascia of the muscle.      The fascia was then incised.  The muscle belly was identified and swept   laterally and retractor placed along the superior neck.  Following   cauterization of the circumflex vessels and removing some pericapsular   fat, a second cobra retractor was placed on the inferior  neck.  A T-capsulotomy was made along the line of the   superior neck to the trochanteric fossa, then extended proximally and   distally.  Tag sutures were placed and the retractors were then placed   intracapsular.  We then identified the trochanteric fossa and   orientation of my neck cut and then made a neck osteotomy with the femur on traction.  The femoral   head was removed without difficulty or complication.  Traction was let   off and retractors were placed posterior and anterior around the   acetabulum.      The labrum and foveal tissue were debrided.  I began reaming with a 49 mm   reamer and reamed up to 53 mm reamer with good bony bed preparation and a 54 mm  cup was chosen.  The final 54 mm Pinnacle cup was then impacted under fluoroscopy to confirm the depth of penetration and orientation with respect to   Abduction and forward flexion.  A screw was placed into the ilium followed by the hole eliminator.  The final   36+4 neutral Altrex liner was impacted with good visualized rim fit.  The cup was  positioned anatomically within the acetabular portion of the pelvis.      At this point, the femur was rolled to 100 degrees.  Further capsule was   released off the inferior aspect of the femoral neck.  I then   released the superior capsule proximally.  With the leg in a neutral position the hook was placed laterally   along the femur under the vastus lateralis origin and elevated manually and then held in position using the hook attachment on the bed.  The leg was then extended and adducted with the leg rolled to 100   degrees of external rotation.  Retractors were placed along the medial calcar and posteriorly over the greater trochanter.  Once the proximal femur was fully   exposed, I used a box osteotome to set orientation.  I then began   broaching with the starting chili pepper broach and passed this by hand and then broached up to 6.  With the 6 broach in place I chose a high offset neck and did several trial reductions.  The offset was appropriate, leg lengths   appeared to be equal best matched with the +1.5 head ball trial confirmed radiographically.   Given these findings, I went ahead and dislocated the hip, repositioned all   retractors and positioned the right hip in the extended and abducted position.  The final 6 Hi Actis stem was   chosen and it was impacted down to the level of neck cut.  Based on this   and the trial reductions, a final 36+1.5 delta ceramic ball was chosen and   impacted onto a clean and dry trunnion, and the hip was reduced.  The   hip had been irrigated throughout the case again at this point.  I did   reapproximate the superior capsular leaflet to the anterior leaflet   using #1 Vicryl.  The fascia of the   tensor fascia lata muscle was then reapproximated using #1 Vicryl and #0 Stratafix sutures.  The   remaining wound was closed with 2-0 Vicryl and running 4-0 Monocryl.   The hip was cleaned, dried, and dressed sterilely using Dermabond and   Aquacel  dressing.  The patient was then brought   to recovery room in stable condition tolerating the procedure well.    Randine  Shuford, PA-C was present for the entirety of the case involved from   preoperative positioning, perioperative retractor management, general   facilitation of the case, as well as primary wound closure as assistant.            Donnice CORDOBA Ernie, M.D.        11/09/2023 7:33 AM

## 2023-11-10 ENCOUNTER — Other Ambulatory Visit (HOSPITAL_COMMUNITY): Payer: Self-pay

## 2023-11-10 DIAGNOSIS — M1612 Unilateral primary osteoarthritis, left hip: Secondary | ICD-10-CM | POA: Diagnosis not present

## 2023-11-10 DIAGNOSIS — Z87891 Personal history of nicotine dependence: Secondary | ICD-10-CM | POA: Diagnosis not present

## 2023-11-10 DIAGNOSIS — Z7982 Long term (current) use of aspirin: Secondary | ICD-10-CM | POA: Diagnosis not present

## 2023-11-10 DIAGNOSIS — J45909 Unspecified asthma, uncomplicated: Secondary | ICD-10-CM | POA: Diagnosis not present

## 2023-11-10 DIAGNOSIS — Z96641 Presence of right artificial hip joint: Secondary | ICD-10-CM | POA: Diagnosis not present

## 2023-11-10 LAB — CBC
HCT: 29.4 % — ABNORMAL LOW (ref 36.0–46.0)
Hemoglobin: 9.2 g/dL — ABNORMAL LOW (ref 12.0–15.0)
MCH: 32.4 pg (ref 26.0–34.0)
MCHC: 31.3 g/dL (ref 30.0–36.0)
MCV: 103.5 fL — ABNORMAL HIGH (ref 80.0–100.0)
Platelets: 224 K/uL (ref 150–400)
RBC: 2.84 MIL/uL — ABNORMAL LOW (ref 3.87–5.11)
RDW: 13.4 % (ref 11.5–15.5)
WBC: 7.3 K/uL (ref 4.0–10.5)
nRBC: 0 % (ref 0.0–0.2)

## 2023-11-10 LAB — BASIC METABOLIC PANEL WITH GFR
Anion gap: 10 (ref 5–15)
BUN: 8 mg/dL (ref 8–23)
CO2: 24 mmol/L (ref 22–32)
Calcium: 8.5 mg/dL — ABNORMAL LOW (ref 8.9–10.3)
Chloride: 103 mmol/L (ref 98–111)
Creatinine, Ser: 0.7 mg/dL (ref 0.44–1.00)
GFR, Estimated: >60 mL/min (ref >60)
Glucose, Bld: 97 mg/dL (ref 70–99)
Potassium: 3.9 mmol/L (ref 3.5–5.1)
Sodium: 138 mmol/L (ref 135–145)

## 2023-11-10 MED ORDER — SENNA 8.6 MG PO TABS
2.0000 | ORAL_TABLET | Freq: Every day | ORAL | 0 refills | Status: AC
  Filled 2023-11-10: qty 28, 14d supply, fill #0

## 2023-11-10 MED ORDER — OXYCODONE HCL 5 MG PO TABS
5.0000 mg | ORAL_TABLET | ORAL | 0 refills | Status: AC | PRN
  Filled 2023-11-10: qty 42, 7d supply, fill #0

## 2023-11-10 MED ORDER — ASPIRIN 81 MG PO CHEW
81.0000 mg | CHEWABLE_TABLET | Freq: Two times a day (BID) | ORAL | 0 refills | Status: AC
  Filled 2023-11-10: qty 56, 28d supply, fill #0

## 2023-11-10 MED ORDER — ACETAMINOPHEN 500 MG PO TABS: 1000.0000 mg | ORAL_TABLET | Freq: Four times a day (QID) | ORAL | Status: AC

## 2023-11-10 MED ORDER — POLYETHYLENE GLYCOL 3350 17 GM/SCOOP PO POWD
17.0000 g | Freq: Two times a day (BID) | ORAL | 0 refills | Status: AC
  Filled 2023-11-10: qty 238, 7d supply, fill #0

## 2023-11-10 MED ORDER — SODIUM CHLORIDE 0.9 % IV BOLUS
500.0000 mL | Freq: Once | INTRAVENOUS | Status: AC
Start: 2023-11-10 — End: 2023-11-10
  Administered 2023-11-10: 500 mL via INTRAVENOUS

## 2023-11-10 MED ORDER — SODIUM CHLORIDE 0.9 % IV BOLUS
500.0000 mL | Freq: Once | INTRAVENOUS | Status: AC
  Administered 2023-11-10: 500 mL via INTRAVENOUS

## 2023-11-10 MED ORDER — METHOCARBAMOL 500 MG PO TABS
500.0000 mg | ORAL_TABLET | Freq: Four times a day (QID) | ORAL | 0 refills | Status: AC | PRN
  Filled 2023-11-10: qty 40, 10d supply, fill #0

## 2023-11-10 NOTE — Progress Notes (Signed)
 Patient voided 325 ml. BP rose to 110/62 following 500 ml Bolus x2. Patient eager to d/c and cleared by MD. Advised patient to keep an eye on blood pressure, encouraged fluids, and informed them  that they can reach a [facility] health care professional 24/7 if needed.

## 2023-11-10 NOTE — Progress Notes (Signed)
Discharge medications delivered to patient at bedside.

## 2023-11-10 NOTE — Care Management Obs Status (Addendum)
 MEDICARE OBSERVATION STATUS NOTIFICATION   Patient Details  Name: Julie Tapia MRN: [id number] Date of Birth: 1950/05/18   Medicare Observation Status Notification Given:  Yes  Patient declined to sign.   NORMAN ASPEN, LCSW 11/10/2023, 12:29 PM

## 2023-11-10 NOTE — Progress Notes (Addendum)
 Physical Therapy Treatment Patient Details Name: SOMALIA SEGLER MRN: [id number] DOB: [birth year] Today's Date: 11/10/2023   History of Present Illness 73 yo S/P  LDATHA, PMH:R DA THA, aneurysm    PT Comments  POD # 1 pm session with Daughter present. Pt has yet to void and RN reported BP's a soft.  Bolus running just a few min when Pt started to c/o pain at IV site and presented with increased fluid swelling.  RN called to room.  Fluids ceased.  Had Daughter hands on assist Pt from recliner to amb in hallway.  General Gait Details: Pt tolerated an increased distance with increased c/o hip pain this afternoon vs this morning.  Pain meds requested.  RN reports BP is soft.  Pt asymptomatic other than tired.  NO c/o dizziness.  With Daughter practiced stairs. General stair comments: 25% VC's on proper hand placement of both hands on one LEFT rail as well as proper sequencing up and down 2 steps. Had Pt self assist back to bed using her strap.  Then IV Nurse arrived to place a new site.  Pt has met her mobility goals to D/C to home today however has yet to void.  Time of day 14:45     If plan is discharge home, recommend the following: A little help with walking and/or transfers;A little help with bathing/dressing/bathroom;Assist for transportation;Help with stairs or ramp for entrance   Can travel by private vehicle        Equipment Recommendations  None recommended by PT    Recommendations for Other Services       Precautions / Restrictions Precautions Precautions: Fall Restrictions Weight Bearing Restrictions Per Provider Order: No LLE Weight Bearing Per Provider Order: Weight bearing as tolerated     Mobility  Bed Mobility Overal bed mobility: Needs Assistance Bed Mobility: Supine to Sit     Supine to sit: Contact guard, Supervision     General bed mobility comments: used  belt to self assist LLE and increased time back to bed    Transfers Overall transfer level:  Needs assistance Equipment used: Rolling walker (2 wheels) Transfers: Sit to/from Stand Sit to Stand: Contact guard assist, Supervision           General transfer comment: cues for hand placement and safety with turn completion    Ambulation/Gait Ambulation/Gait assistance: Supervision Gait Distance (Feet): 65 Feet Assistive device: Rolling walker (2 wheels) Gait Pattern/deviations: Step-to pattern, Step-through pattern, Decreased step length - right, Decreased step length - left Gait velocity: decr     General Gait Details: Pt tolerated an increased distance with increased c/o hip pain this afternoon vs this morning.  Pain meds requested.  RN reports BP is soft.  Pt asymptomatic other than tired.   Stairs Stairs: Yes Stairs assistance: Contact guard assist Stair Management: One rail Left, Step to pattern, Sideways, Forwards Number of Stairs: 2 General stair comments: 25% VC's on proper hand placement of both hands on one LEFT rail as well as proper sequencing up and down 2 steps.   Wheelchair Mobility     Tilt Bed    Modified Rankin (Stroke Patients Only)       Balance                                            Communication Communication Communication: No apparent difficulties  Cognition Arousal:  Alert Behavior During Therapy: WFL for tasks assessed/performed   PT - Cognitive impairments: No apparent impairments                       PT - Cognition Comments: AxO x 3 pleasant and motivated Following commands: Intact      Cueing Cueing Techniques: Verbal cues  Exercises      General Comments        Pertinent Vitals/Pain Pain Assessment Pain Assessment: Faces Faces Pain Scale: Hurts a little bit Pain Location: Chronic back and L Hip Pain Descriptors / Indicators: Discomfort, Grimacing, Tender, Operative site guarding Pain Intervention(s): Monitored during session, Premedicated before session, Repositioned, Ice  applied    Home Living                          Prior Function            PT Goals (current goals can now be found in the care plan section) Progress towards PT goals: Progressing toward goals    Frequency    7X/week      PT Plan      Co-evaluation              AM-PAC PT 6 Clicks Mobility   Outcome Measure  Help needed turning from your back to your side while in a flat bed without using bedrails?: A Little Help needed moving from lying on your back to sitting on the side of a flat bed without using bedrails?: A Little Help needed moving to and from a bed to a chair (including a wheelchair)?: A Little Help needed standing up from a chair using your arms (e.g., wheelchair or bedside chair)?: A Little Help needed to walk in hospital room?: A Little Help needed climbing 3-5 steps with a railing? : A Little 6 Click Score: 18    End of Session Equipment Utilized During Treatment: Gait belt Activity Tolerance: Patient tolerated treatment well Patient left: in bed;with family/visitor present;with call bell/phone within reach Nurse Communication: Mobility status PT Visit Diagnosis: Unsteadiness on feet (R26.81);Pain;Muscle weakness (generalized) (M62.81) Pain - Right/Left: Left Pain - part of body: Hip     Time: 8585-8556 PT Time Calculation (min) (ACUTE ONLY): 29 min  Charges:    $Gait Training: 8-22 mins $Therapeutic Activity: 8-22 mins PT General Charges $$ ACUTE PT VISIT: 1 Visit                     Katheryn Leap  PTA Acute  Rehabilitation Services Office M-F          [phone]

## 2023-11-10 NOTE — Progress Notes (Signed)
 Physical Therapy Treatment Patient Details Name: Julie Tapia MRN: [id number] DOB: 17-Jun-1950 Today's Date: 11/10/2023   History of Present Illness 73 yo S/P  LDATHA, PMH:R DA THA, aneurysm    PT Comments  POD # 1 am session Pt is AxO x 3 pleasant and motivated.  Max c/o tired and poor sleep.  Assisted OOB using her strap to guide L LE.  Assisted with amb in hallway.  General Gait Details: <25% VC's on proper sequencing and proper walker to self distance.  3/10 hip pain.  Slow but steady. Practiced stairs.  General stair comments: 25% VC's on proper hand placement of both hands on one LEFT rail as well as proper sequencing up and down 2 steps. Assisted back to room amb with walker then to [facility] for attempted void.  VC's on safety with turns using walker and proper back stepping.  Pt only voided < 50cc reported to NT.  Assisted to recliner and positioned to comfort along with ICE to L hip. Pt will have a pm PT session with daughter.  Pt expected to D/C to home later today after second session.     If plan is discharge home, recommend the following: A little help with walking and/or transfers;A little help with bathing/dressing/bathroom;Assist for transportation;Help with stairs or ramp for entrance   Can travel by private vehicle        Equipment Recommendations  None recommended by PT    Recommendations for Other Services       Precautions / Restrictions Precautions Precautions: Fall Restrictions Weight Bearing Restrictions Per Provider Order: No LLE Weight Bearing Per Provider Order: Weight bearing as tolerated     Mobility  Bed Mobility Overal bed mobility: Needs Assistance Bed Mobility: Supine to Sit     Supine to sit: Supervision     General bed mobility comments: used  belt to self assist LLE and increased time    Transfers Overall transfer level: Needs assistance Equipment used: Rolling walker (2 wheels) Transfers: Sit to/from Stand Sit to Stand: Contact guard  assist           General transfer comment: cues for hand placement and safety with turn completion    Ambulation/Gait Ambulation/Gait assistance: Contact guard assist Gait Distance (Feet): 45 Feet Assistive device: Rolling walker (2 wheels) Gait Pattern/deviations: Step-to pattern, Step-through pattern, Decreased step length - right, Decreased step length - left Gait velocity: decr     General Gait Details: <25% VC's on proper sequencing and proper walker to self distance.  3/10 hip pain.  Slow but steady.   Stairs Stairs: Yes Stairs assistance: Contact guard assist Stair Management: One rail Left, Step to pattern, Sideways, Forwards Number of Stairs: 2 General stair comments: 25% VC's on proper hand placement of both hands on one LEFT rail as well as proper sequencing up and down 2 steps.   Wheelchair Mobility     Tilt Bed    Modified Rankin (Stroke Patients Only)       Balance                                            Communication Communication Communication: No apparent difficulties  Cognition Arousal: Alert Behavior During Therapy: WFL for tasks assessed/performed   PT - Cognitive impairments: No apparent impairments  PT - Cognition Comments: AxO x 3 pleasant and motivated Following commands: Intact      Cueing Cueing Techniques: Verbal cues  Exercises      General Comments        Pertinent Vitals/Pain Pain Assessment Pain Assessment: Faces Faces Pain Scale: Hurts a little bit Pain Location: Chronic back and L Hip Pain Descriptors / Indicators: Discomfort, Grimacing, Tender, Operative site guarding Pain Intervention(s): Monitored during session, Premedicated before session, Repositioned, Ice applied    Home Living                          Prior Function            PT Goals (current goals can now be found in the care plan section) Progress towards PT goals: Progressing toward  goals    Frequency    7X/week      PT Plan      Co-evaluation              AM-PAC PT 6 Clicks Mobility   Outcome Measure  Help needed turning from your back to your side while in a flat bed without using bedrails?: A Little Help needed moving from lying on your back to sitting on the side of a flat bed without using bedrails?: A Little Help needed moving to and from a bed to a chair (including a wheelchair)?: A Little Help needed standing up from a chair using your arms (e.g., wheelchair or bedside chair)?: A Little Help needed to walk in hospital room?: A Little Help needed climbing 3-5 steps with a railing? : A Little 6 Click Score: 18    End of Session Equipment Utilized During Treatment: Gait belt Activity Tolerance: Patient tolerated treatment well Patient left: in chair;with call bell/phone within reach;with chair alarm set Nurse Communication: Mobility status PT Visit Diagnosis: Unsteadiness on feet (R26.81);Pain;Muscle weakness (generalized) (M62.81) Pain - Right/Left: Left Pain - part of body: Hip     Time: 1204-1230 PT Time Calculation (min) (ACUTE ONLY): 26 min  Charges:    $Gait Training: 8-22 mins $Therapeutic Activity: 8-22 mins PT General Charges $$ ACUTE PT VISIT: 1 Visit                     Katheryn Leap  PTA Acute  Rehabilitation Services Office M-F          [phone]

## 2023-11-10 NOTE — TOC Transition Note (Signed)
 Transition of Care [facility]) - Discharge Note   Patient Details  Name: [name] MRN: [id number] Date of Birth: 1950/05/29  [facility] CM/SW Contact:  NORMAN ASPEN, LCSW Phone Number: 11/10/2023, 12:18 PM   Clinical Narrative:     Met with pt who confirms she has all needed DME in the home.  Plan for HEP.  No further IP CM needs.  Final next level of care: Home/Self Care Barriers to Discharge: No Barriers Identified   Patient Goals and CMS Choice Patient states their goals for this hospitalization and ongoing recovery are:: return home          Discharge Placement                       Discharge Plan and Services Additional resources added to the After Visit Summary for                  DME Arranged: N/A DME Agency: NA                  Social Drivers of Health (SDOH) Interventions SDOH Screenings   Food Insecurity: No Food Insecurity (11/09/2023)  Housing: Low Risk  (11/09/2023)  Transportation Needs: No Transportation Needs (11/09/2023)  Utilities: Not At Risk (11/09/2023)  Social Connections: Moderately Integrated (11/09/2023)  Tobacco Use: Medium Risk (11/09/2023)     Readmission Risk Interventions     No data to display

## 2023-11-10 NOTE — Progress Notes (Signed)
 Discharge instructions given to patient, questions asked and answered.

## 2023-11-10 NOTE — Progress Notes (Signed)
   Subjective: 1 Day Post-Op Procedure(s) (LRB): ARTHROPLASTY, HIP, TOTAL, ANTERIOR APPROACH (Left) Patient reports pain as mild.   Patient seen in rounds for Dr. Ernie. Patient is resting in bed on exam this morning. Daughter at the bedside. She was very thankful for her RN overnight, Pam. No acute events overnight. Foley catheter removed. Patient ambulated 80 feet with PT yesterday. We will start therapy today.   Objective: Vital signs in last 24 hours: Temp:  [97 F (36.1 C)-100 F (37.8 C)] 99.2 F (37.3 C) (09/10 0557) Pulse Rate:  [55-84] 83 (09/10 0557) Resp:  [13-22] 18 (09/10 0557) BP: (96-168)/(55-115) 107/59 (09/10 0557) SpO2:  [91 %-100 %] 96 % (09/10 0557)  Intake/Output from previous day:  Intake/Output Summary (Last 24 hours) at 11/10/2023 0826 Last data filed at 11/10/2023 0635 Gross per 24 hour  Intake 2681.16 ml  Output 4825 ml  Net -2143.84 ml     Intake/Output this shift: No intake/output data recorded.  Labs: Recent Labs    11/10/23 0328  HGB 9.2*   Recent Labs    11/10/23 0328  WBC 7.3  RBC 2.84*  HCT 29.4*  PLT 224   Recent Labs    11/10/23 0328  NA 138  K 3.9  CL 103  CO2 24  BUN 8  CREATININE 0.70  GLUCOSE 97  CALCIUM  8.5*   No results for input(s): LABPT, INR in the last 72 hours.  Exam: General - Patient is Alert and Oriented Extremity - Neurologically intact Sensation intact distally Intact pulses distally Dorsiflexion/Plantar flexion intact Dressing - dressing C/D/I Motor Function - intact, moving foot and toes well on exam.   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 tight chested-no  inhalers in yrs.   Complication of anesthesia 01-16-11   Pt. very slow to awaken and was days before feeling like herself.    Assessment/Plan: 1 Day Post-Op Procedure(s) (LRB): ARTHROPLASTY, HIP, TOTAL, ANTERIOR APPROACH (Left) Principal Problem:   S/P total left hip arthroplasty  Estimated body mass index is 20.37 kg/m as calculated from the following:   Height as of this encounter: 5' 8 (1.727 m).   Weight as of this encounter: 60.8 kg. Advance diet Up with therapy D/C IV fluids  DVT Prophylaxis - Aspirin  Weight bearing as tolerated.  Hgb stable at 9.2 this AM, likely related to dilution from IVF, with only 400 cc intra op blood loss   Plan is to go Home after hospital stay. Plan for discharge today after meeting goals with therapy. Follow up in the office in 2 weeks.   Rosina Calin, PA-C Orthopedic Surgery [phone] 11/10/2023, 8:26 AM

## 2023-11-11 ENCOUNTER — Encounter (HOSPITAL_COMMUNITY): Payer: Self-pay | Admitting: Orthopedic Surgery

## 2023-11-21 NOTE — Discharge Summary (Signed)
 Patient ID: Julie Tapia MRN: [id number] DOB/AGE: 1951-02-02 73 y.o.  Admit date: 11/09/2023 Discharge date: 11/10/2023  Admission Diagnoses:  Left hip osteoarthritis  Discharge Diagnoses:  Principal Problem:   S/P total left hip arthroplasty   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 tight chested-no inhalers in yrs.   Complication of anesthesia 01-16-11   Pt. very slow to awaken and was days before feeling like herself.    Surgeries: Procedure(s): ARTHROPLASTY, HIP, TOTAL, ANTERIOR APPROACH on 11/09/2023   Consultants:   Discharged Condition: Improved  Hospital Course: Julie Tapia is an 73 y.o. female who was admitted 11/09/2023 for operative treatment ofS/P total left hip arthroplasty. Patient has severe unremitting pain that affects sleep, daily activities, and work/hobbies. After pre-op clearance the patient was taken to the operating room on 11/09/2023 and underwent  Procedure(s): ARTHROPLASTY, HIP, TOTAL, ANTERIOR APPROACH.    Patient was given perioperative antibiotics:  Anti-infectives (From admission, onward)    Start     Dose/Rate Route Frequency Ordered Stop   11/09/23 1430  ceFAZolin  (ANCEF ) IVPB 2g/100 mL premix        2 g 200 mL/hr over 30 Minutes Intravenous Every 6 hours 11/09/23 1320 11/10/23 0913   11/09/23 0645  ceFAZolin  (ANCEF ) IVPB 2g/100 mL premix        2 g 200 mL/hr over 30 Minutes Intravenous On call to O.R. 11/09/23 9363 11/09/23 9145        Patient was given sequential compression devices, early ambulation, and chemoprophylaxis to prevent DVT. Patient worked with PT and was meeting their goals regarding safe ambulation and  transfers.  Patient benefited maximally from hospital stay and there were no complications.    Recent vital signs: No data found.   Recent laboratory studies: No results for input(s): WBC, HGB, HCT, PLT, NA, K, CL, CO2, BUN, CREATININE, GLUCOSE, INR, CALCIUM  in the last 72 hours.  Invalid input(s): PT, 2   Discharge Medications:   Allergies as of 11/10/2023       Reactions   Dexamethasone  Other (See Comments)   Patient feels that spine, leg and eye problems got worse after IM injection. dexamethasone    Erythromycin Anaphylaxis   ALL MYCINS   Other Anaphylaxis   ALL MYCIN MEDICATIONS   Ciprofloxacin Hcl Itching, Swelling   Contrast Media [iodinated Contrast Media] Itching, Swelling   Pt states she can tolerate this with a benadryl  post use and has used contrast in the past.   Lidocaine  Itching, Other (See Comments)   Pt states that this is not a true allergy and can tolerate this    Methylprednisolone     Patient feels that spine, leg and eye problems got worse after IM injection.   Penicillins Other (See Comments)   Felt like had Chickenpox 01-16-11 Keflex  is antibiotic of choice per pt.-[facility]   Sulfa Drugs Cross Reactors Itching, Swelling, Other (See Comments)   All sulfa drugs        Medication List     STOP taking these medications    aspirin  325 MG tablet Replaced by: Aspirin  Low Dose 81 MG chewable tablet   diclofenac 75 MG EC tablet Commonly known as:  VOLTAREN       TAKE these medications    acetaminophen  500 MG tablet Commonly known as: TYLENOL  Take 2 tablets (1,000 mg total) by mouth every 6 (six) hours.   albuterol  108 (90 Base) MCG/ACT inhaler Commonly known as: VENTOLIN  HFA Inhale 2 puffs into the lungs every 6 (six) hours as needed for wheezing or shortness of breath.   Arnicare Gel Apply 1 application topically 4 (four) times daily as needed. pain   Aspirin  Low Dose 81 MG chewable tablet Generic drug:  aspirin  Chew 1 tablet (81 mg total) by mouth 2 (two) times daily for 28 days. Replaces: aspirin  325 MG tablet   gabapentin  100 MG capsule Commonly known as: NEURONTIN  Take 2 capsules (200 mg total) by mouth at bedtime. What changed: how much to take   lidocaine  5 % Commonly known as: Lidoderm  Place 2 patches onto the skin daily. Remove & Discard patch within 12 hours or as directed by MD   methocarbamol  500 MG tablet Commonly known as: ROBAXIN  Take 1 tablet (500 mg total) by mouth every 6 (six) hours as needed for muscle spasms.   oxyCODONE  5 MG immediate release tablet Commonly known as: Oxy IR/ROXICODONE  Take 1-2 tablets (5-10 mg total) by mouth every 4 (four) hours as needed for severe pain (pain score 7-10). Start with 1 tablet. Only use 2 tablets for severe pain.   polyethylene glycol powder 17 GM/SCOOP powder Commonly known as: GLYCOLAX /MIRALAX  Take 17 g dissolved in liquid by mouth 2 (two) times daily.   pravastatin  40 MG tablet Commonly known as: PRAVACHOL  Take 1 tablet (40 mg total) by mouth every evening.   senna 8.6 MG Tabs tablet Commonly known as: SENOKOT Take 2 tablets (17.2 mg total) by mouth at bedtime for 14 days.   Voltaren 1 % Gel Generic drug: diclofenac Sodium Apply 2 g topically daily as needed (pain).               Discharge Care Instructions  (From admission, onward)           Start     Ordered   11/10/23 0000  Change dressing       Comments: Maintain surgical dressing until follow up in the clinic. If the edges start to pull up, may reinforce with tape. If the dressing is no longer working, may remove and cover with gauze and tape, but must keep the area dry and clean.  Call with any questions or concerns.   11/10/23 0828            Diagnostic Studies: DG HIP UNILAT WITH PELVIS 1V LEFT Result Date: 11/09/2023 CLINICAL DATA:  Elective surgery. EXAM: DG HIP (WITH OR WITHOUT PELVIS) 1V*L* COMPARISON:  01/20/2011. FINDINGS: Multiple  intraoperative fluoroscopic spot images are provided. Interval left total hip arthroplasty. Normal alignment. Total fluoroscopy time: 8 seconds Total dose: Radiation Exposure Index (as provided by the fluoroscopic device): 1.2 mGy air Kerma Please see intraoperative findings for further detail. IMPRESSION: Intraoperative fluoroscopy of interval left total hip arthroplasty. Electronically Signed   By: Harrietta Sherry M.D.   On: 11/09/2023 13:24   DG Pelvis Portable Result Date: 11/09/2023 CLINICAL DATA:  Status post left total hip arthroplasty. EXAM: DG PORTABLE PELVIS COMPARISON:  01/20/2011. FINDINGS: Status post left total hip arthroplasty with normal alignment. No acute fracture or dislocation. Redemonstrated right total hip arthroplasty. Expected postoperative soft tissue swelling and soft tissue air along the left hip. IMPRESSION: Status post left total hip arthroplasty with normal alignment.  No acute complication. Electronically Signed   By: Harrietta Sherry M.D.   On: 11/09/2023 13:23   DG C-Arm 1-60 Min-No Report Result Date: 11/09/2023 Fluoroscopy was utilized by the requesting physician.  No radiographic interpretation.    Disposition: Discharge disposition: 01-Home or Self Care       Discharge Instructions     Call MD / Call 911   Complete by: As directed    If you experience chest pain or shortness of breath, CALL 911 and be transported to the hospital emergency room.  If you develope a fever above 101 F, pus (white drainage) or increased drainage or redness at the wound, or calf pain, call your surgeon's office.   Change dressing   Complete by: As directed    Maintain surgical dressing until follow up in the clinic. If the edges start to pull up, may reinforce with tape. If the dressing is no longer working, may remove and cover with gauze and tape, but must keep the area dry and clean.  Call with any questions or concerns.   Constipation Prevention   Complete by: As directed     Drink plenty of fluids.  Prune juice may be helpful.  You may use a stool softener, such as Colace (over the counter) 100 mg twice a day.  Use MiraLax  (over the counter) for constipation as needed.   Diet - low sodium heart healthy   Complete by: As directed    Increase activity slowly as tolerated   Complete by: As directed    Weight bearing as tolerated with assist device (walker, cane, etc) as directed, use it as long as suggested by your surgeon or therapist, typically at least 4-6 weeks.   Post-operative opioid taper instructions:   Complete by: As directed    POST-OPERATIVE OPIOID TAPER INSTRUCTIONS: It is important to wean off of your opioid medication as soon as possible. If you do not need pain medication after your surgery it is ok to stop day one. Opioids include: Codeine, Hydrocodone (Norco, Vicodin), Oxycodone (Percocet, oxycontin ) and hydromorphone  amongst others.  Long term and even short term use of opiods can cause: Increased pain response Dependence Constipation Depression Respiratory depression And more.  Withdrawal symptoms can include Flu like symptoms Nausea, vomiting And more Techniques to manage these symptoms Hydrate well Eat regular healthy meals Stay active Use relaxation techniques(deep breathing, meditating, yoga) Do Not substitute Alcohol  to help with tapering If you have been on opioids for less than two weeks and do not have pain than it is ok to stop all together.  Plan to wean off of opioids This plan should start within one week post op of your joint replacement. Maintain the same interval or time between taking each dose and first decrease the dose.  Cut the total daily intake of opioids by one tablet each day Next start to increase the time between doses. The last dose that should be eliminated is the evening dose.      TED hose   Complete by: As directed    Use stockings (TED hose) for 2 weeks on both leg(s).  You may remove them at night  for sleeping.        Follow-up Information     Ernie Cough, MD. Go on 11/24/2023.   Specialty: Orthopedic Surgery Why: You are scheduled for a post op appointment on Wednesday 11/24/23 at 10:15am Contact information: [address] [place] [address] [phone]  Signed: Rosina JONELLE Calin 11/21/2023, 9:01 AM

## 2023-11-24 DIAGNOSIS — Z471 Aftercare following joint replacement surgery: Secondary | ICD-10-CM | POA: Diagnosis not present

## 2023-11-24 DIAGNOSIS — Z96642 Presence of left artificial hip joint: Secondary | ICD-10-CM | POA: Diagnosis not present

## 2023-12-29 DIAGNOSIS — Z5189 Encounter for other specified aftercare: Secondary | ICD-10-CM | POA: Diagnosis not present

## 2024-04-04 ENCOUNTER — Ambulatory Visit: Admitting: Diagnostic Neuroimaging

## 2024-04-04 ENCOUNTER — Encounter: Payer: Self-pay | Admitting: Diagnostic Neuroimaging

## 2024-04-05 ENCOUNTER — Ambulatory Visit: Admitting: Neurology

## 2024-06-06 ENCOUNTER — Ambulatory Visit: Admitting: Diagnostic Neuroimaging
